# Patient Record
Sex: Female | Born: 1945
Health system: Southern US, Community
[De-identification: ages and names within clinical notes are randomized; demographics above are authoritative.]

## PROBLEM LIST (undated history)

## (undated) DIAGNOSIS — R1310 Dysphagia, unspecified: Secondary | ICD-10-CM

## (undated) DIAGNOSIS — G231 Progressive supranuclear ophthalmoplegia [Steele-Richardson-Olszewski]: Secondary | ICD-10-CM

## (undated) DIAGNOSIS — K635 Polyp of colon: Secondary | ICD-10-CM

## (undated) DIAGNOSIS — K219 Gastro-esophageal reflux disease without esophagitis: Secondary | ICD-10-CM

## (undated) DIAGNOSIS — R0602 Shortness of breath: Secondary | ICD-10-CM

## (undated) DIAGNOSIS — C50919 Malignant neoplasm of unspecified site of unspecified female breast: Secondary | ICD-10-CM

## (undated) DIAGNOSIS — E785 Hyperlipidemia, unspecified: Secondary | ICD-10-CM

## (undated) DIAGNOSIS — I10 Essential (primary) hypertension: Secondary | ICD-10-CM

## (undated) DIAGNOSIS — R51 Headache: Secondary | ICD-10-CM

## (undated) DIAGNOSIS — C439 Malignant melanoma of skin, unspecified: Secondary | ICD-10-CM

## (undated) DIAGNOSIS — F329 Major depressive disorder, single episode, unspecified: Secondary | ICD-10-CM

## (undated) DIAGNOSIS — M549 Dorsalgia, unspecified: Secondary | ICD-10-CM

## (undated) DIAGNOSIS — Z9289 Personal history of other medical treatment: Secondary | ICD-10-CM

## (undated) DIAGNOSIS — R269 Unspecified abnormalities of gait and mobility: Secondary | ICD-10-CM

## (undated) DIAGNOSIS — R471 Dysarthria and anarthria: Secondary | ICD-10-CM

## (undated) DIAGNOSIS — F32A Depression, unspecified: Secondary | ICD-10-CM

## (undated) DIAGNOSIS — E039 Hypothyroidism, unspecified: Secondary | ICD-10-CM

## (undated) DIAGNOSIS — Z780 Asymptomatic menopausal state: Secondary | ICD-10-CM

## (undated) DIAGNOSIS — E669 Obesity, unspecified: Secondary | ICD-10-CM

## (undated) DIAGNOSIS — G8929 Other chronic pain: Secondary | ICD-10-CM

## (undated) DIAGNOSIS — R490 Dysphonia: Secondary | ICD-10-CM

## (undated) HISTORY — DX: Dysarthria and anarthria: R47.1

## (undated) HISTORY — DX: Essential (primary) hypertension: I10

## (undated) HISTORY — PX: CATARACT EXTRACTION: SUR2

## (undated) HISTORY — DX: Shortness of breath: R06.02

## (undated) HISTORY — DX: Malignant neoplasm of unspecified site of unspecified female breast: C50.919

## (undated) HISTORY — DX: Major depressive disorder, single episode, unspecified: F32.9

## (undated) HISTORY — PX: OTHER SURGICAL HISTORY: SHX169

## (undated) HISTORY — DX: Gastro-esophageal reflux disease without esophagitis: K21.9

## (undated) HISTORY — DX: Dysphonia: R49.0

## (undated) HISTORY — DX: Other chronic pain: G89.29

## (undated) HISTORY — DX: Dorsalgia, unspecified: M54.9

## (undated) HISTORY — DX: Asymptomatic menopausal state: Z78.0

## (undated) HISTORY — DX: Polyp of colon: K63.5

## (undated) HISTORY — DX: Hyperlipidemia, unspecified: E78.5

## (undated) HISTORY — DX: Unspecified abnormalities of gait and mobility: R26.9

## (undated) HISTORY — PX: MASTECTOMY: SHX3

## (undated) HISTORY — DX: Malignant melanoma of skin, unspecified: C43.9

## (undated) HISTORY — DX: Personal history of other medical treatment: Z92.89

## (undated) HISTORY — DX: Progressive supranuclear ophthalmoplegia (steele-Richardson-olszewski): G23.1

## (undated) HISTORY — DX: Dysphagia, unspecified: R13.10

## (undated) HISTORY — DX: Hypothyroidism, unspecified: E03.9

## (undated) HISTORY — DX: Obesity, unspecified: E66.9

## (undated) HISTORY — DX: Headache: R51

## (undated) HISTORY — DX: Depression, unspecified: F32.A

---

## 1988-05-17 HISTORY — PX: TOTAL ABDOMINAL HYSTERECTOMY W/ BILATERAL SALPINGOOPHORECTOMY: SHX83

## 1994-05-17 DIAGNOSIS — C50919 Malignant neoplasm of unspecified site of unspecified female breast: Secondary | ICD-10-CM

## 1994-05-17 HISTORY — DX: Malignant neoplasm of unspecified site of unspecified female breast: C50.919

## 1998-03-10 ENCOUNTER — Ambulatory Visit (HOSPITAL_BASED_OUTPATIENT_CLINIC_OR_DEPARTMENT_OTHER): Admission: RE | Admit: 1998-03-10 | Discharge: 1998-03-10 | Payer: Self-pay | Admitting: *Deleted

## 1998-06-17 ENCOUNTER — Ambulatory Visit (HOSPITAL_COMMUNITY): Admission: RE | Admit: 1998-06-17 | Discharge: 1998-06-17 | Payer: Self-pay | Admitting: *Deleted

## 1998-09-25 ENCOUNTER — Other Ambulatory Visit: Admission: RE | Admit: 1998-09-25 | Discharge: 1998-09-25 | Payer: Self-pay | Admitting: Obstetrics and Gynecology

## 1998-10-15 ENCOUNTER — Ambulatory Visit (HOSPITAL_COMMUNITY): Admission: RE | Admit: 1998-10-15 | Discharge: 1998-10-15 | Payer: Self-pay | Admitting: *Deleted

## 1999-10-13 ENCOUNTER — Other Ambulatory Visit: Admission: RE | Admit: 1999-10-13 | Discharge: 1999-10-13 | Payer: Self-pay | Admitting: Obstetrics and Gynecology

## 1999-12-02 ENCOUNTER — Encounter: Admission: RE | Admit: 1999-12-02 | Discharge: 1999-12-02 | Payer: Self-pay | Admitting: Obstetrics and Gynecology

## 1999-12-02 ENCOUNTER — Encounter: Payer: Self-pay | Admitting: Obstetrics and Gynecology

## 2000-10-13 ENCOUNTER — Other Ambulatory Visit: Admission: RE | Admit: 2000-10-13 | Discharge: 2000-10-13 | Payer: Self-pay | Admitting: Obstetrics and Gynecology

## 2000-11-04 ENCOUNTER — Other Ambulatory Visit: Admission: RE | Admit: 2000-11-04 | Discharge: 2000-11-04 | Payer: Self-pay | Admitting: Obstetrics and Gynecology

## 2000-11-04 ENCOUNTER — Encounter (INDEPENDENT_AMBULATORY_CARE_PROVIDER_SITE_OTHER): Payer: Self-pay

## 2000-11-11 ENCOUNTER — Ambulatory Visit (HOSPITAL_COMMUNITY): Admission: RE | Admit: 2000-11-11 | Discharge: 2000-11-11 | Payer: Self-pay | Admitting: Obstetrics and Gynecology

## 2000-11-11 ENCOUNTER — Encounter (INDEPENDENT_AMBULATORY_CARE_PROVIDER_SITE_OTHER): Payer: Self-pay | Admitting: Specialist

## 2000-12-09 ENCOUNTER — Encounter (INDEPENDENT_AMBULATORY_CARE_PROVIDER_SITE_OTHER): Payer: Self-pay

## 2000-12-09 ENCOUNTER — Ambulatory Visit (HOSPITAL_COMMUNITY): Admission: RE | Admit: 2000-12-09 | Discharge: 2000-12-09 | Payer: Self-pay | Admitting: Obstetrics and Gynecology

## 2000-12-27 ENCOUNTER — Encounter: Admission: RE | Admit: 2000-12-27 | Discharge: 2000-12-27 | Payer: Self-pay | Admitting: Obstetrics and Gynecology

## 2000-12-27 ENCOUNTER — Encounter: Payer: Self-pay | Admitting: Obstetrics and Gynecology

## 2001-05-02 ENCOUNTER — Encounter: Payer: Self-pay | Admitting: Emergency Medicine

## 2001-05-03 ENCOUNTER — Inpatient Hospital Stay (HOSPITAL_COMMUNITY): Admission: EM | Admit: 2001-05-03 | Discharge: 2001-05-05 | Payer: Self-pay | Admitting: Emergency Medicine

## 2001-05-03 ENCOUNTER — Encounter: Payer: Self-pay | Admitting: Cardiology

## 2001-05-05 ENCOUNTER — Encounter: Payer: Self-pay | Admitting: Cardiology

## 2001-10-11 ENCOUNTER — Other Ambulatory Visit: Admission: RE | Admit: 2001-10-11 | Discharge: 2001-10-11 | Payer: Self-pay | Admitting: Obstetrics and Gynecology

## 2002-01-01 ENCOUNTER — Encounter: Payer: Self-pay | Admitting: Obstetrics and Gynecology

## 2002-01-01 ENCOUNTER — Encounter: Admission: RE | Admit: 2002-01-01 | Discharge: 2002-01-01 | Payer: Self-pay | Admitting: Obstetrics and Gynecology

## 2002-01-22 ENCOUNTER — Encounter: Admission: RE | Admit: 2002-01-22 | Discharge: 2002-01-22 | Payer: Self-pay | Admitting: Internal Medicine

## 2002-01-22 ENCOUNTER — Encounter: Payer: Self-pay | Admitting: Internal Medicine

## 2002-09-03 ENCOUNTER — Encounter: Admission: RE | Admit: 2002-09-03 | Discharge: 2002-12-02 | Payer: Self-pay | Admitting: Internal Medicine

## 2002-11-27 ENCOUNTER — Encounter: Payer: Self-pay | Admitting: Internal Medicine

## 2002-11-27 ENCOUNTER — Encounter: Admission: RE | Admit: 2002-11-27 | Discharge: 2002-11-27 | Payer: Self-pay | Admitting: Internal Medicine

## 2003-01-15 ENCOUNTER — Encounter: Admission: RE | Admit: 2003-01-15 | Discharge: 2003-01-15 | Payer: Self-pay | Admitting: Obstetrics and Gynecology

## 2003-01-15 ENCOUNTER — Encounter: Payer: Self-pay | Admitting: Obstetrics and Gynecology

## 2003-08-06 ENCOUNTER — Ambulatory Visit (HOSPITAL_COMMUNITY): Admission: RE | Admit: 2003-08-06 | Discharge: 2003-08-06 | Payer: Self-pay | Admitting: *Deleted

## 2003-11-05 ENCOUNTER — Ambulatory Visit (HOSPITAL_COMMUNITY): Admission: RE | Admit: 2003-11-05 | Discharge: 2003-11-05 | Payer: Self-pay | Admitting: Neurosurgery

## 2004-01-17 ENCOUNTER — Encounter: Admission: RE | Admit: 2004-01-17 | Discharge: 2004-01-17 | Payer: Self-pay | Admitting: Obstetrics and Gynecology

## 2004-01-22 ENCOUNTER — Inpatient Hospital Stay (HOSPITAL_COMMUNITY): Admission: RE | Admit: 2004-01-22 | Discharge: 2004-01-23 | Payer: Self-pay | Admitting: Neurosurgery

## 2004-03-16 ENCOUNTER — Ambulatory Visit: Payer: Self-pay | Admitting: Internal Medicine

## 2004-03-26 ENCOUNTER — Ambulatory Visit: Payer: Self-pay | Admitting: Internal Medicine

## 2004-04-21 ENCOUNTER — Ambulatory Visit: Payer: Self-pay | Admitting: Internal Medicine

## 2004-04-22 ENCOUNTER — Ambulatory Visit: Payer: Self-pay | Admitting: Internal Medicine

## 2004-05-12 ENCOUNTER — Ambulatory Visit: Payer: Self-pay | Admitting: Internal Medicine

## 2004-05-17 DIAGNOSIS — C439 Malignant melanoma of skin, unspecified: Secondary | ICD-10-CM

## 2004-05-17 HISTORY — DX: Malignant melanoma of skin, unspecified: C43.9

## 2004-05-19 ENCOUNTER — Ambulatory Visit: Payer: Self-pay | Admitting: Internal Medicine

## 2004-06-29 ENCOUNTER — Ambulatory Visit: Payer: Self-pay | Admitting: Internal Medicine

## 2004-07-21 ENCOUNTER — Ambulatory Visit: Payer: Self-pay | Admitting: Internal Medicine

## 2004-08-18 ENCOUNTER — Ambulatory Visit: Payer: Self-pay | Admitting: Internal Medicine

## 2004-09-01 ENCOUNTER — Ambulatory Visit: Payer: Self-pay | Admitting: Internal Medicine

## 2004-09-04 ENCOUNTER — Ambulatory Visit: Payer: Self-pay | Admitting: Pulmonary Disease

## 2004-09-04 ENCOUNTER — Ambulatory Visit (HOSPITAL_COMMUNITY): Admission: RE | Admit: 2004-09-04 | Discharge: 2004-09-04 | Payer: Self-pay | Admitting: Internal Medicine

## 2004-09-15 ENCOUNTER — Ambulatory Visit: Payer: Self-pay | Admitting: Internal Medicine

## 2004-12-22 ENCOUNTER — Ambulatory Visit: Payer: Self-pay | Admitting: Internal Medicine

## 2005-02-02 ENCOUNTER — Ambulatory Visit: Payer: Self-pay | Admitting: Internal Medicine

## 2005-02-09 ENCOUNTER — Ambulatory Visit: Payer: Self-pay | Admitting: Internal Medicine

## 2005-02-16 ENCOUNTER — Ambulatory Visit: Payer: Self-pay | Admitting: Internal Medicine

## 2005-03-02 ENCOUNTER — Ambulatory Visit: Payer: Self-pay | Admitting: Internal Medicine

## 2005-03-09 ENCOUNTER — Encounter: Admission: RE | Admit: 2005-03-09 | Discharge: 2005-03-09 | Payer: Self-pay | Admitting: Obstetrics and Gynecology

## 2005-03-24 ENCOUNTER — Encounter: Admission: RE | Admit: 2005-03-24 | Discharge: 2005-03-24 | Payer: Self-pay | Admitting: Obstetrics and Gynecology

## 2005-03-26 ENCOUNTER — Ambulatory Visit: Payer: Self-pay | Admitting: Internal Medicine

## 2005-04-13 ENCOUNTER — Encounter: Admission: RE | Admit: 2005-04-13 | Discharge: 2005-04-13 | Payer: Self-pay | Admitting: Internal Medicine

## 2005-04-13 ENCOUNTER — Ambulatory Visit: Payer: Self-pay | Admitting: Internal Medicine

## 2005-05-03 ENCOUNTER — Ambulatory Visit (HOSPITAL_COMMUNITY): Admission: RE | Admit: 2005-05-03 | Discharge: 2005-05-04 | Payer: Self-pay | Admitting: Neurosurgery

## 2005-05-23 ENCOUNTER — Encounter: Admission: RE | Admit: 2005-05-23 | Discharge: 2005-05-23 | Payer: Self-pay | Admitting: Neurosurgery

## 2005-05-25 ENCOUNTER — Ambulatory Visit (HOSPITAL_COMMUNITY): Admission: RE | Admit: 2005-05-25 | Discharge: 2005-05-25 | Payer: Self-pay | Admitting: Neurosurgery

## 2005-06-01 ENCOUNTER — Encounter: Admission: RE | Admit: 2005-06-01 | Discharge: 2005-06-01 | Payer: Self-pay | Admitting: Neurosurgery

## 2005-06-21 ENCOUNTER — Encounter: Admission: RE | Admit: 2005-06-21 | Discharge: 2005-06-21 | Payer: Self-pay | Admitting: Neurosurgery

## 2005-07-08 ENCOUNTER — Encounter: Admission: RE | Admit: 2005-07-08 | Discharge: 2005-07-08 | Payer: Self-pay | Admitting: Neurosurgery

## 2005-09-02 ENCOUNTER — Ambulatory Visit (HOSPITAL_COMMUNITY): Admission: RE | Admit: 2005-09-02 | Discharge: 2005-09-02 | Payer: Self-pay | Admitting: Neurosurgery

## 2005-09-22 ENCOUNTER — Ambulatory Visit: Payer: Self-pay | Admitting: Internal Medicine

## 2005-12-10 ENCOUNTER — Ambulatory Visit: Payer: Self-pay | Admitting: Internal Medicine

## 2005-12-14 ENCOUNTER — Ambulatory Visit: Payer: Self-pay | Admitting: Internal Medicine

## 2006-03-30 ENCOUNTER — Encounter: Admission: RE | Admit: 2006-03-30 | Discharge: 2006-03-30 | Payer: Self-pay | Admitting: Obstetrics and Gynecology

## 2006-09-02 ENCOUNTER — Ambulatory Visit: Payer: Self-pay | Admitting: Internal Medicine

## 2006-09-02 LAB — CONVERTED CEMR LAB
BUN: 15 mg/dL (ref 6–23)
CO2: 25 meq/L (ref 19–32)
Calcium: 8.9 mg/dL (ref 8.4–10.5)
Creatinine, Ser: 0.84 mg/dL (ref 0.40–1.20)
Eosinophils Absolute: 0.1 10*3/uL (ref 0.0–0.7)
Eosinophils Relative: 1 % (ref 0–5)
Glucose, Bld: 111 mg/dL — ABNORMAL HIGH (ref 70–99)
HCT: 38.1 % (ref 36.0–46.0)
Hemoglobin: 12.1 g/dL (ref 12.0–15.0)
Lymphocytes Relative: 30 % (ref 12–46)
Lymphs Abs: 3 10*3/uL (ref 0.7–3.3)
MCV: 81.9 fL (ref 78.0–100.0)
Monocytes Absolute: 0.7 10*3/uL (ref 0.2–0.7)
Monocytes Relative: 7 % (ref 3–11)
Platelets: 354 10*3/uL (ref 150–400)
TSH: 0.702 microintl units/mL (ref 0.350–5.50)
WBC: 10.1 10*3/uL (ref 4.0–10.5)

## 2006-09-04 ENCOUNTER — Emergency Department (HOSPITAL_COMMUNITY): Admission: EM | Admit: 2006-09-04 | Discharge: 2006-09-04 | Payer: Self-pay | Admitting: Emergency Medicine

## 2006-09-21 ENCOUNTER — Ambulatory Visit: Payer: Self-pay | Admitting: Internal Medicine

## 2006-10-03 DIAGNOSIS — Z860100 Personal history of colon polyps, unspecified: Secondary | ICD-10-CM | POA: Insufficient documentation

## 2006-10-03 DIAGNOSIS — N951 Menopausal and female climacteric states: Secondary | ICD-10-CM

## 2006-10-03 DIAGNOSIS — F329 Major depressive disorder, single episode, unspecified: Secondary | ICD-10-CM

## 2006-10-03 DIAGNOSIS — E0590 Thyrotoxicosis, unspecified without thyrotoxic crisis or storm: Secondary | ICD-10-CM | POA: Insufficient documentation

## 2006-10-03 DIAGNOSIS — K219 Gastro-esophageal reflux disease without esophagitis: Secondary | ICD-10-CM | POA: Insufficient documentation

## 2006-10-03 DIAGNOSIS — M810 Age-related osteoporosis without current pathological fracture: Secondary | ICD-10-CM | POA: Insufficient documentation

## 2006-10-03 DIAGNOSIS — E785 Hyperlipidemia, unspecified: Secondary | ICD-10-CM | POA: Insufficient documentation

## 2006-10-12 ENCOUNTER — Ambulatory Visit: Payer: Self-pay | Admitting: Internal Medicine

## 2006-10-12 ENCOUNTER — Encounter: Payer: Self-pay | Admitting: Internal Medicine

## 2006-10-13 ENCOUNTER — Encounter: Payer: Self-pay | Admitting: Internal Medicine

## 2006-12-27 ENCOUNTER — Ambulatory Visit: Payer: Self-pay | Admitting: Family Medicine

## 2006-12-27 DIAGNOSIS — E039 Hypothyroidism, unspecified: Secondary | ICD-10-CM | POA: Insufficient documentation

## 2006-12-27 DIAGNOSIS — R498 Other voice and resonance disorders: Secondary | ICD-10-CM

## 2007-01-02 LAB — CONVERTED CEMR LAB
Basophils Absolute: 0.1 10*3/uL (ref 0.0–0.1)
Basophils Relative: 0.8 % (ref 0.0–1.0)
Calcium: 9.6 mg/dL (ref 8.4–10.5)
Chloride: 104 meq/L (ref 96–112)
Folate: >20 ng/mL
GFR calc Af Amer: 94 mL/min
GFR calc non Af Amer: 78 mL/min
Glucose, Bld: 103 mg/dL — ABNORMAL HIGH (ref 70–99)
Hemoglobin: 13 g/dL (ref 12.0–15.0)
MCHC: 34.3 g/dL (ref 30.0–36.0)
Monocytes Absolute: 0.6 10*3/uL (ref 0.2–0.7)
Monocytes Relative: 6.9 % (ref 3.0–11.0)
Platelets: 306 10*3/uL (ref 150–400)
RDW: 13.7 % (ref 11.5–14.6)
Sodium: 141 meq/L (ref 135–145)
TSH: 1.14 microintl units/mL (ref 0.35–5.50)
Vitamin B-12: 792 pg/mL (ref 211–911)

## 2007-02-03 ENCOUNTER — Ambulatory Visit: Payer: Self-pay | Admitting: Family Medicine

## 2007-02-21 ENCOUNTER — Ambulatory Visit: Payer: Self-pay | Admitting: Internal Medicine

## 2007-02-21 DIAGNOSIS — I10 Essential (primary) hypertension: Secondary | ICD-10-CM | POA: Insufficient documentation

## 2007-02-21 DIAGNOSIS — M549 Dorsalgia, unspecified: Secondary | ICD-10-CM

## 2007-03-09 ENCOUNTER — Ambulatory Visit: Payer: Self-pay | Admitting: Internal Medicine

## 2007-03-14 ENCOUNTER — Encounter (INDEPENDENT_AMBULATORY_CARE_PROVIDER_SITE_OTHER): Payer: Self-pay | Admitting: *Deleted

## 2007-03-14 LAB — CONVERTED CEMR LAB
BUN: 12 mg/dL (ref 6–23)
CO2: 31 meq/L (ref 19–32)
Chloride: 102 meq/L (ref 96–112)
Cholesterol: 233 mg/dL (ref 0–200)
Creatinine, Ser: 0.8 mg/dL (ref 0.4–1.2)
Direct LDL: 135.9 mg/dL
VLDL: 78 mg/dL — ABNORMAL HIGH (ref 0–40)

## 2007-04-03 ENCOUNTER — Encounter: Admission: RE | Admit: 2007-04-03 | Discharge: 2007-04-03 | Payer: Self-pay | Admitting: Obstetrics and Gynecology

## 2007-04-05 ENCOUNTER — Ambulatory Visit: Payer: Self-pay | Admitting: Internal Medicine

## 2007-05-18 DIAGNOSIS — Z9289 Personal history of other medical treatment: Secondary | ICD-10-CM

## 2007-05-18 HISTORY — DX: Personal history of other medical treatment: Z92.89

## 2007-06-29 ENCOUNTER — Ambulatory Visit: Payer: Self-pay | Admitting: Internal Medicine

## 2007-07-31 ENCOUNTER — Encounter (INDEPENDENT_AMBULATORY_CARE_PROVIDER_SITE_OTHER): Payer: Self-pay | Admitting: *Deleted

## 2007-08-07 ENCOUNTER — Telehealth (INDEPENDENT_AMBULATORY_CARE_PROVIDER_SITE_OTHER): Payer: Self-pay | Admitting: *Deleted

## 2007-10-13 ENCOUNTER — Ambulatory Visit: Payer: Self-pay | Admitting: Internal Medicine

## 2007-10-13 DIAGNOSIS — Z853 Personal history of malignant neoplasm of breast: Secondary | ICD-10-CM | POA: Insufficient documentation

## 2007-10-16 ENCOUNTER — Telehealth (INDEPENDENT_AMBULATORY_CARE_PROVIDER_SITE_OTHER): Payer: Self-pay | Admitting: *Deleted

## 2007-10-16 ENCOUNTER — Ambulatory Visit: Payer: Self-pay

## 2007-10-16 ENCOUNTER — Encounter: Payer: Self-pay | Admitting: Internal Medicine

## 2007-10-23 ENCOUNTER — Telehealth (INDEPENDENT_AMBULATORY_CARE_PROVIDER_SITE_OTHER): Payer: Self-pay | Admitting: *Deleted

## 2007-10-23 ENCOUNTER — Ambulatory Visit: Payer: Self-pay | Admitting: Internal Medicine

## 2007-10-24 HISTORY — PX: SPINAL FUSION: SHX223

## 2007-10-26 ENCOUNTER — Telehealth: Payer: Self-pay | Admitting: Internal Medicine

## 2008-01-01 ENCOUNTER — Encounter: Admission: RE | Admit: 2008-01-01 | Discharge: 2008-03-31 | Payer: Self-pay | Admitting: Orthopaedic Surgery

## 2008-01-05 ENCOUNTER — Ambulatory Visit: Payer: Self-pay | Admitting: Internal Medicine

## 2008-01-05 ENCOUNTER — Telehealth: Payer: Self-pay | Admitting: Internal Medicine

## 2008-01-05 ENCOUNTER — Ambulatory Visit: Payer: Self-pay

## 2008-01-08 ENCOUNTER — Telehealth (INDEPENDENT_AMBULATORY_CARE_PROVIDER_SITE_OTHER): Payer: Self-pay | Admitting: *Deleted

## 2008-01-19 ENCOUNTER — Ambulatory Visit: Payer: Self-pay | Admitting: Internal Medicine

## 2008-02-29 ENCOUNTER — Ambulatory Visit: Payer: Self-pay | Admitting: Internal Medicine

## 2008-02-29 DIAGNOSIS — J309 Allergic rhinitis, unspecified: Secondary | ICD-10-CM | POA: Insufficient documentation

## 2008-03-19 ENCOUNTER — Ambulatory Visit: Payer: Self-pay | Admitting: Internal Medicine

## 2008-03-22 ENCOUNTER — Ambulatory Visit: Payer: Self-pay | Admitting: Internal Medicine

## 2008-03-26 ENCOUNTER — Telehealth (INDEPENDENT_AMBULATORY_CARE_PROVIDER_SITE_OTHER): Payer: Self-pay | Admitting: *Deleted

## 2008-03-26 LAB — CONVERTED CEMR LAB
Calcium: 9.5 mg/dL (ref 8.4–10.5)
GFR calc Af Amer: 109 mL/min
GFR calc non Af Amer: 90 mL/min
Glucose, Bld: 81 mg/dL (ref 70–99)
Sodium: 142 meq/L (ref 135–145)

## 2008-04-01 ENCOUNTER — Encounter: Admission: RE | Admit: 2008-04-01 | Discharge: 2008-05-13 | Payer: Self-pay | Admitting: Orthopaedic Surgery

## 2008-04-03 ENCOUNTER — Encounter: Admission: RE | Admit: 2008-04-03 | Discharge: 2008-04-03 | Payer: Self-pay | Admitting: Obstetrics and Gynecology

## 2008-04-16 DIAGNOSIS — G231 Progressive supranuclear ophthalmoplegia [Steele-Richardson-Olszewski]: Secondary | ICD-10-CM

## 2008-04-16 HISTORY — DX: Progressive supranuclear ophthalmoplegia (steele-Richardson-olszewski): G23.1

## 2008-05-02 ENCOUNTER — Encounter: Payer: Self-pay | Admitting: Internal Medicine

## 2008-05-14 ENCOUNTER — Ambulatory Visit: Payer: Self-pay | Admitting: Internal Medicine

## 2008-05-15 ENCOUNTER — Ambulatory Visit: Payer: Self-pay | Admitting: Internal Medicine

## 2008-05-20 ENCOUNTER — Telehealth: Payer: Self-pay | Admitting: Internal Medicine

## 2008-05-20 LAB — CONVERTED CEMR LAB
GFR calc Af Amer: 93 mL/min
GFR calc non Af Amer: 77 mL/min
Glucose, Bld: 91 mg/dL (ref 70–99)
MCHC: 33.9 g/dL (ref 30.0–36.0)
Potassium: 3.7 meq/L (ref 3.5–5.1)
RBC: 4.3 M/uL (ref 3.87–5.11)
RDW: 13.6 % (ref 11.5–14.6)
Sodium: 141 meq/L (ref 135–145)
TSH: 1.07 microintl units/mL (ref 0.35–5.50)

## 2008-05-28 ENCOUNTER — Ambulatory Visit: Payer: Self-pay

## 2008-05-28 ENCOUNTER — Encounter: Payer: Self-pay | Admitting: Internal Medicine

## 2008-06-05 ENCOUNTER — Telehealth: Payer: Self-pay | Admitting: Internal Medicine

## 2008-06-05 ENCOUNTER — Encounter: Admission: RE | Admit: 2008-06-05 | Discharge: 2008-08-09 | Payer: Self-pay | Admitting: Neurology

## 2008-06-11 ENCOUNTER — Encounter: Payer: Self-pay | Admitting: Internal Medicine

## 2008-06-14 ENCOUNTER — Telehealth: Payer: Self-pay | Admitting: Family Medicine

## 2008-07-05 ENCOUNTER — Ambulatory Visit: Payer: Self-pay | Admitting: Internal Medicine

## 2008-07-05 DIAGNOSIS — G231 Progressive supranuclear ophthalmoplegia [Steele-Richardson-Olszewski]: Secondary | ICD-10-CM | POA: Insufficient documentation

## 2008-07-31 ENCOUNTER — Encounter: Payer: Self-pay | Admitting: Internal Medicine

## 2008-09-10 ENCOUNTER — Ambulatory Visit: Payer: Self-pay | Admitting: Internal Medicine

## 2008-10-18 ENCOUNTER — Encounter: Payer: Self-pay | Admitting: Internal Medicine

## 2008-10-31 ENCOUNTER — Encounter: Payer: Self-pay | Admitting: Internal Medicine

## 2008-11-08 ENCOUNTER — Ambulatory Visit: Payer: Self-pay | Admitting: Internal Medicine

## 2008-11-14 ENCOUNTER — Ambulatory Visit: Payer: Self-pay | Admitting: Internal Medicine

## 2008-11-14 LAB — CONVERTED CEMR LAB
Calcium: 9.5 mg/dL (ref 8.4–10.5)
GFR calc non Af Amer: 89.57 mL/min (ref >60)
Sodium: 140 meq/L (ref 135–145)

## 2008-11-29 ENCOUNTER — Ambulatory Visit: Payer: Self-pay | Admitting: Internal Medicine

## 2008-12-03 ENCOUNTER — Telehealth (INDEPENDENT_AMBULATORY_CARE_PROVIDER_SITE_OTHER): Payer: Self-pay | Admitting: *Deleted

## 2008-12-05 ENCOUNTER — Telehealth (INDEPENDENT_AMBULATORY_CARE_PROVIDER_SITE_OTHER): Payer: Self-pay | Admitting: *Deleted

## 2008-12-05 ENCOUNTER — Telehealth: Payer: Self-pay | Admitting: Family Medicine

## 2008-12-05 LAB — CONVERTED CEMR LAB
Potassium: 2.9 meq/L — ABNORMAL LOW (ref 3.5–5.3)
Sodium: 138 meq/L (ref 135–145)

## 2008-12-17 ENCOUNTER — Telehealth (INDEPENDENT_AMBULATORY_CARE_PROVIDER_SITE_OTHER): Payer: Self-pay | Admitting: *Deleted

## 2008-12-18 ENCOUNTER — Ambulatory Visit: Payer: Self-pay | Admitting: Internal Medicine

## 2008-12-26 LAB — CONVERTED CEMR LAB: Potassium: 4.3 meq/L (ref 3.5–5.1)

## 2009-01-17 ENCOUNTER — Encounter: Payer: Self-pay | Admitting: Internal Medicine

## 2009-02-06 ENCOUNTER — Ambulatory Visit (HOSPITAL_COMMUNITY): Admission: RE | Admit: 2009-02-06 | Discharge: 2009-02-06 | Payer: Self-pay | Admitting: Anesthesiology

## 2009-02-24 ENCOUNTER — Encounter: Admission: RE | Admit: 2009-02-24 | Discharge: 2009-05-14 | Payer: Self-pay | Admitting: Psychiatry

## 2009-03-21 ENCOUNTER — Ambulatory Visit: Payer: Self-pay | Admitting: Internal Medicine

## 2009-03-24 ENCOUNTER — Encounter: Payer: Self-pay | Admitting: Internal Medicine

## 2009-04-17 ENCOUNTER — Encounter: Admission: RE | Admit: 2009-04-17 | Discharge: 2009-04-17 | Payer: Self-pay | Admitting: Obstetrics and Gynecology

## 2009-05-28 ENCOUNTER — Ambulatory Visit: Payer: Self-pay | Admitting: Internal Medicine

## 2009-05-28 DIAGNOSIS — M25559 Pain in unspecified hip: Secondary | ICD-10-CM

## 2009-05-30 LAB — CONVERTED CEMR LAB
Basophils Relative: 6.5 % — ABNORMAL HIGH (ref 0.0–3.0)
CO2: 22 meq/L (ref 19–32)
Chloride: 100 meq/L (ref 96–112)
Eosinophils Absolute: 0.2 10*3/uL (ref 0.0–0.7)
Hemoglobin: 12.4 g/dL (ref 12.0–15.0)
Lymphocytes Relative: 27.4 % (ref 12.0–46.0)
MCHC: 32.3 g/dL (ref 30.0–36.0)
MCV: 85.4 fL (ref 78.0–100.0)
Neutro Abs: 3.5 10*3/uL (ref 1.4–7.7)
RBC: 4.5 M/uL (ref 3.87–5.11)
Sodium: 136 meq/L (ref 135–145)

## 2009-06-20 ENCOUNTER — Encounter: Payer: Self-pay | Admitting: Internal Medicine

## 2009-06-25 ENCOUNTER — Ambulatory Visit: Payer: Self-pay | Admitting: Internal Medicine

## 2009-06-30 ENCOUNTER — Ambulatory Visit: Payer: Self-pay | Admitting: Internal Medicine

## 2009-07-05 ENCOUNTER — Encounter: Admission: RE | Admit: 2009-07-05 | Discharge: 2009-07-05 | Payer: Self-pay | Admitting: Neurology

## 2009-07-11 ENCOUNTER — Telehealth: Payer: Self-pay | Admitting: Internal Medicine

## 2009-07-28 ENCOUNTER — Encounter: Admission: RE | Admit: 2009-07-28 | Discharge: 2009-07-28 | Payer: Self-pay | Admitting: Sports Medicine

## 2009-08-29 ENCOUNTER — Ambulatory Visit: Payer: Self-pay | Admitting: Internal Medicine

## 2009-09-09 ENCOUNTER — Telehealth: Payer: Self-pay | Admitting: Internal Medicine

## 2009-09-12 ENCOUNTER — Ambulatory Visit: Payer: Self-pay | Admitting: Internal Medicine

## 2009-09-24 ENCOUNTER — Encounter: Payer: Self-pay | Admitting: Internal Medicine

## 2009-09-25 ENCOUNTER — Encounter: Payer: Self-pay | Admitting: Internal Medicine

## 2009-09-29 ENCOUNTER — Encounter: Payer: Self-pay | Admitting: Internal Medicine

## 2009-10-08 ENCOUNTER — Encounter (INDEPENDENT_AMBULATORY_CARE_PROVIDER_SITE_OTHER): Payer: Self-pay | Admitting: *Deleted

## 2009-10-10 ENCOUNTER — Encounter: Payer: Self-pay | Admitting: Internal Medicine

## 2009-10-14 ENCOUNTER — Encounter: Payer: Self-pay | Admitting: Internal Medicine

## 2009-10-16 ENCOUNTER — Encounter: Payer: Self-pay | Admitting: Internal Medicine

## 2009-12-17 ENCOUNTER — Encounter: Payer: Self-pay | Admitting: Internal Medicine

## 2009-12-18 ENCOUNTER — Ambulatory Visit: Payer: Self-pay | Admitting: Internal Medicine

## 2009-12-18 LAB — CONVERTED CEMR LAB
CO2: 30 meq/L (ref 19–32)
Calcium: 9.6 mg/dL (ref 8.4–10.5)
Chloride: 100 meq/L (ref 96–112)
Glucose, Bld: 95 mg/dL (ref 70–99)
Sodium: 141 meq/L (ref 135–145)

## 2009-12-19 ENCOUNTER — Telehealth: Payer: Self-pay | Admitting: Internal Medicine

## 2010-01-02 ENCOUNTER — Ambulatory Visit: Payer: Self-pay | Admitting: Internal Medicine

## 2010-01-26 ENCOUNTER — Telehealth (INDEPENDENT_AMBULATORY_CARE_PROVIDER_SITE_OTHER): Payer: Self-pay | Admitting: *Deleted

## 2010-01-28 ENCOUNTER — Ambulatory Visit: Payer: Self-pay | Admitting: Internal Medicine

## 2010-01-28 LAB — CONVERTED CEMR LAB
CO2: 27 meq/L (ref 19–32)
Calcium: 9.9 mg/dL (ref 8.4–10.5)
Chloride: 104 meq/L (ref 96–112)
Glucose, Bld: 100 mg/dL — ABNORMAL HIGH (ref 70–99)
Potassium: 2.8 meq/L — CL (ref 3.5–5.1)
Sodium: 140 meq/L (ref 135–145)

## 2010-02-12 ENCOUNTER — Ambulatory Visit: Payer: Self-pay | Admitting: Internal Medicine

## 2010-02-19 ENCOUNTER — Ambulatory Visit: Payer: Self-pay | Admitting: Internal Medicine

## 2010-02-24 LAB — CONVERTED CEMR LAB
BUN: 14 mg/dL (ref 6–23)
CO2: 30 meq/L (ref 19–32)
Chloride: 101 meq/L (ref 96–112)
Creatinine, Ser: 0.8 mg/dL (ref 0.4–1.2)
Glucose, Bld: 90 mg/dL (ref 70–99)

## 2010-04-21 ENCOUNTER — Ambulatory Visit: Payer: Self-pay | Admitting: Internal Medicine

## 2010-04-22 ENCOUNTER — Encounter: Payer: Self-pay | Admitting: Internal Medicine

## 2010-04-30 ENCOUNTER — Encounter: Payer: Self-pay | Admitting: Internal Medicine

## 2010-04-30 ENCOUNTER — Ambulatory Visit (HOSPITAL_COMMUNITY)
Admission: RE | Admit: 2010-04-30 | Discharge: 2010-04-30 | Payer: Self-pay | Source: Home / Self Care | Attending: Internal Medicine | Admitting: Internal Medicine

## 2010-06-07 ENCOUNTER — Encounter: Payer: Self-pay | Admitting: Unknown Physician Specialty

## 2010-06-14 LAB — CONVERTED CEMR LAB
Albumin: 4 g/dL (ref 3.5–5.2)
Alkaline Phosphatase: 67 units/L (ref 39–117)
BUN: 19 mg/dL (ref 6–23)
Basophils Relative: 0.2 % (ref 0.0–1.0)
Calcium: 9.8 mg/dL (ref 8.4–10.5)
Creatinine, Ser: 0.9 mg/dL (ref 0.4–1.2)
Eosinophils Relative: 0.9 % (ref 0.0–5.0)
GFR calc Af Amer: 82 mL/min
Glucose, Bld: 95 mg/dL (ref 70–99)
HCT: 39 % (ref 36.0–46.0)
Hemoglobin: 13.2 g/dL (ref 12.0–15.0)
MCV: 85.3 fL (ref 78.0–100.0)
Monocytes Absolute: 0.7 10*3/uL (ref 0.1–1.0)
Monocytes Relative: 8.2 % (ref 3.0–12.0)
Neutro Abs: 6.1 10*3/uL (ref 1.4–7.7)
TSH: 0.9 microintl units/mL (ref 0.35–5.50)
Total Protein: 7.1 g/dL (ref 6.0–8.3)
WBC: 8.9 10*3/uL (ref 4.5–10.5)

## 2010-06-18 NOTE — Assessment & Plan Note (Signed)
Summary: 2 mth fu/ns/kdc   Vital Signs:  Patient profile:   65 year old female Height:      65 inches Weight:      220 pounds BMI:     36.74 Pulse rate:   84 / minute BP sitting:   142 / 84  Vitals Entered By: Shary Decamp (May 28, 2009 3:44 PM) CC: rov   History of Present Illness: PSP the patient saw neurology, note  reviewed, no specific medication changes suggested her muscle strength has decreased particularly in the lower extremities, having a hard time ambulating   also complained of pain by the  left hip saw Dr. Vear Clock, her pain medicine doctor, reportedly at x-ray was negative. Her neurologist prescribed Ultram which is not helping denies any   bulging in that area  depression overall better however complain of   sweats ever seen she is started fluoxetin; symptoms worse lately denies fever or cough  also complained of nausea denies abdominal pain, heartburn, blood in the stools, diarrhea. Takes  NSAIDs sporadically  hypertension good medication compliance  no ambulatory BPs   Current Medications (verified): 1)  Levothyroxine Sodium 125 Mcg Tabs (Levothyroxine Sodium) .Marland Kitchen.. 1 By Mouth Once Daily 2)  Furosemide 20 Mg  Tabs (Furosemide) .Marland Kitchen.. 1 By Mouth Once Daily (Takes 2 Tabs Every 3 Days) 3)  Protonix 40 Mg Tbec (Pantoprazole Sodium) .Marland Kitchen.. 1 Tab 30 Minutes  Before Each Breakfast 4)  Calcium Plus Vit-D .... 500mg  2 Once Daily 5)  Hyzaar 100-25 Mg Tabs (Losartan Potassium-Hctz) .Marland Kitchen.. 1po Once Daily 6)  K-Tabs 10 Meq  Tbcr (Potassium Chloride) .... 3 By Mouth Once Daily; 3 @ Noon 7)  Flonase 50 Mcg/act Susp (Fluticasone Propionate) .... 2 Sprays Once Daily 8)  Mvi 9)  Stalevo 200 .... Three Times A Day 10)  Fluoxetine Hcl 20 Mg Caps (Fluoxetine Hcl) .... One By Mouth Twice A Day 11)  Sinemet 25-100 Mg Tabs (Carbidopa-Levodopa) .... Three Times A Day 12)  Zolpidem Tartrate 5 Mg Tabs (Zolpidem Tartrate) .... One or Two By Mouth At Bedtime As Needed For  Insomnia 13)  Tramadol ?mg .Marland Kitchen.. 3-4x/day  Allergies (verified): No Known Drug Allergies  Past History:  Past Medical History: Reviewed history from 03/21/2009 and no changes required. Parkinson, Dx 12-09--neuro Dr Anne Hahn , Dx change to Progressive Supranuclaer Palsy in 2010 Depression GERD osteoporosis Hyperlipidemia Hypothyroidism Hypertension BACK PAIN, CHRONIC   HOARSENESS, CHRONIC   COLONIC POLYPS, HX OF   POSTMENOPAUSAL STATUS  h/o SOB  s/p a negative Cardio-pulmonary stress test 08-27-04, normal PFT s 2006 Breast cancer, hx of 1996 2006  Melanoma in situ nose, s/p Mohs surgery Allergic rhinitis  Past Surgical History: Reviewed history from 02/29/2008 and no changes required. Mastectomy Hysterectomy  and  oophorectomy 1990 STRESS TEST (DECEMBER 2002) FRACTURE, ARM,  Spinal fusion 10-24-07, 6 screws Cataract extraction --R--  Social History: Reviewed history from 10/13/2007 and no changes required. Married no children  Review of Systems      See HPI  Physical Exam  General:  alert and well-developed.   Mouth:  her voice is quite weak, slightly hoarse Lungs:  normal respiratory effort, no intercostal retractions, no accessory muscle use, and normal breath sounds.   Heart:  normal rate, regular rhythm, and no murmur.   Extremities:  no edema palpation of the proximal L thight ( where she complains of pain) show with no hernia, mass or tenderness.  Rotation of the left hip  does increase the pain Neurologic:  in a wheelchair Psych:  Oriented X3, memory intact for recent and remote, normally interactive, good eye contact, not anxious appearing, and not depressed appearing.     Impression & Recommendations:  Problem # 1:  PROGRESSIVE SUPRANUCLEAR PALSY (ICD-333.0) per neurology patient is now in a wheelchair , having a more difficult time ambulating  Problem # 2:  HIP PAIN, LEFT (ICD-719.45) x-rays reportedly negative was prescribed Ultram, reportedly not  helping switch to Vicodin, watch for excessive sedation  Her updated medication list for this problem includes:    Vicodin 5-500 Mg Tabs (Hydrocodone-acetaminophen) ..... One by mouth every 4 hours as needed for pain  Problem # 3:  HYPERTHYROIDISM (ICD-242.90) checking a TSH Labs Reviewed: TSH: 0.46 (11/14/2008)     Problem # 4:  HYPERTENSION (ICD-401.9) due for labs Her updated medication list for this problem includes:    Furosemide 20 Mg Tabs (Furosemide) .Marland Kitchen... 1 by mouth once daily (takes 2 tabs every 3 days)    Hyzaar 100-25 Mg Tabs (Losartan potassium-hctz) .Marland Kitchen... 1po once daily  BP today: 142/84 Prior BP: 130/82 (03/21/2009)  Labs Reviewed: K+: 4.3 (12/18/2008) Creat: : 0.78 (11/29/2008)   Chol: 233 (03/09/2007)   HDL: 33.9 (03/09/2007)   LDL: DEL (03/09/2007)   TG: 391 (03/09/2007)  Orders: Venipuncture (40981) TLB-BMP (Basic Metabolic Panel-BMET) (80048-METABOL) TLB-CBC Platelet - w/Differential (85025-CBCD)  Problem # 5:  DEPRESSION (ICD-311) symptoms better with fluoxetine but  complain of sweats also has nausea with a negative GI review of systems.  Nausea may be from fluoxetine as well. Plan  switch to Elavil gradually.  See previous office visit note may need to skip ambien Her updated medication list for this problem includes:    Fluoxetine Hcl 20 Mg Caps (Fluoxetine hcl) ..... One by mouth once daily    Amitriptyline Hcl 10 Mg Tabs (Amitriptyline hcl) ..... One by mouth nightly for one week, to by mouth nightly next week, 3 by mouth nightly thereafter  Complete Medication List: 1)  Levothyroxine Sodium 125 Mcg Tabs (Levothyroxine sodium) .Marland Kitchen.. 1 by mouth once daily 2)  Furosemide 20 Mg Tabs (Furosemide) .Marland Kitchen.. 1 by mouth once daily (takes 2 tabs every 3 days) 3)  Protonix 40 Mg Tbec (Pantoprazole sodium) .Marland Kitchen.. 1 tab 30 minutes  before each breakfast 4)  Calcium Plus Vit-d  .... 500mg  2 once daily 5)  Hyzaar 100-25 Mg Tabs (Losartan potassium-hctz) .Marland Kitchen.. 1po once  daily 6)  K-tabs 10 Meq Tbcr (Potassium chloride) .... 3 by mouth once daily; 3 @ noon 7)  Flonase 50 Mcg/act Susp (Fluticasone propionate) .... 2 sprays once daily 8)  Mvi  9)  Stalevo 200  .... Three times a day 10)  Fluoxetine Hcl 20 Mg Caps (Fluoxetine hcl) .... One by mouth once daily 11)  Sinemet 25-100 Mg Tabs (Carbidopa-levodopa) .... Three times a day 12)  Zolpidem Tartrate 5 Mg Tabs (Zolpidem tartrate) .... One or two by mouth at bedtime as needed for insomnia 13)  Amitriptyline Hcl 10 Mg Tabs (Amitriptyline hcl) .... One by mouth nightly for one week, to by mouth nightly next week, 3 by mouth nightly thereafter 14)  Vicodin 5-500 Mg Tabs (Hydrocodone-acetaminophen) .... One by mouth every 4 hours as needed for pain  Other Orders: TLB-TSH (Thyroid Stimulating Hormone) (84443-TSH)  Patient Instructions: 1)  take only one fluoxetine 2)  start amitriptyline gradually 3)  discontinue Ultram, take Vicodin instead for pain 4)  watch for excessive sedation 5)  Please schedule a follow-up appointment in 1 month.  Prescriptions: VICODIN 5-500 MG TABS (HYDROCODONE-ACETAMINOPHEN) one by mouth every 4 hours as needed for pain  #60 x 0   Entered and Authorized by:   Nolon Rod. Bryer Gottsch MD   Signed by:   Nolon Rod. Victorian Gunn MD on 05/28/2009   Method used:   Print then Give to Patient   RxID:   [id number] AMITRIPTYLINE HCL 10 MG TABS (AMITRIPTYLINE HCL) one by mouth nightly for one week, to by mouth nightly next week, 3 by mouth nightly thereafter  #90 x 1   Entered and Authorized by:   Elita Quick E. Calogero Geisen MD   Signed by:   Nolon Rod. Norrine Ballester MD on 05/28/2009   Method used:   Electronically to        [facility] [phone]* (retail)       [address]       [place], [place]  [address]       Ph: [phone]       Fax: [phone]   RxID:   [id number]

## 2010-06-18 NOTE — Assessment & Plan Note (Signed)
Summary: eval for scooter/cbs   Vital Signs:  Patient profile:   65 year old female Height:      65 inches Weight:      235.13 pounds BMI:     39.27 Pulse rate:   74 / minute BP sitting:   112 / 62  Vitals Entered By: Shary Decamp (September 12, 2009 2:54 PM) CC: evaluate for scooter   History of Present Illness: needs a prescription for a scooter since 2009 when she was diagnosed with parkinson (and later  PSP), she has getting progressively weaker, mostly in the lower extremities.  She has sustained several falls lately  Current Medications (verified): 1)  Levothyroxine Sodium 125 Mcg Tabs (Levothyroxine Sodium) .Marland Kitchen.. 1 By Mouth Once Daily 2)  Furosemide 20 Mg  Tabs (Furosemide) .Marland Kitchen.. 1 By Mouth Once Daily (Takes 2 Tabs Every 3 Days) 3)  Protonix 40 Mg Tbec (Pantoprazole Sodium) .... Take One Tablet Two Times A Day 4)  Calcium Plus Vit-D .... 500mg  2 Once Daily 5)  Hyzaar 100-25 Mg Tabs (Losartan Potassium-Hctz) .Marland Kitchen.. 1po Once Daily 6)  Flonase 50 Mcg/act Susp (Fluticasone Propionate) .... 2 Sprays Once Daily 7)  Mvi 8)  Amitriptyline Hcl 25 Mg Tabs (Amitriptyline Hcl) .Marland Kitchen.. 1 A Day 9)  Klor-Con M20 20 Meq Cr-Tabs (Potassium Chloride Crys Cr) .... 2 By Mouth Once Daily - Check Potassium in 3 Weeks  Allergies (verified): No Known Drug Allergies  Past History:  Past Medical History: Reviewed history from 03/21/2009 and no changes required. Parkinson, Dx 12-09--neuro Dr Anne Hahn , Dx change to Progressive Supranuclaer Palsy in 2010 Depression GERD osteoporosis Hyperlipidemia Hypothyroidism Hypertension BACK PAIN, CHRONIC   HOARSENESS, CHRONIC   COLONIC POLYPS, HX OF   POSTMENOPAUSAL STATUS  h/o SOB  s/p a negative Cardio-pulmonary stress test 08-27-04, normal PFT s 2006 Breast cancer, hx of 1996 2006  Melanoma in situ nose, s/p Mohs surgery Allergic rhinitis  Past Surgical History: Reviewed history from 02/29/2008 and no changes required. Mastectomy Hysterectomy  and   oophorectomy 1990 STRESS TEST (DECEMBER 2002) FRACTURE, ARM,  Spinal fusion 10-24-07, 6 screws Cataract extraction --R--  Social History: Reviewed history from 10/13/2007 and no changes required. Married no children  Physical Exam  General:  unchanged from previous   Impression & Recommendations:  Problem # 1:  PROGRESSIVE SUPRANUCLEAR PALSY (ICD-333.0) extensive paperwork for a scooter filled  with the assistance of the patient and her husband time spent 15  minutes face-to-face  Complete Medication List: 1)  Levothyroxine Sodium 125 Mcg Tabs (Levothyroxine sodium) .Marland Kitchen.. 1 by mouth once daily 2)  Furosemide 20 Mg Tabs (Furosemide) .Marland Kitchen.. 1 by mouth once daily (takes 2 tabs every 3 days) 3)  Protonix 40 Mg Tbec (Pantoprazole sodium) .... Take one tablet two times a day 4)  Calcium Plus Vit-d  .... 500mg  2 once daily 5)  Hyzaar 100-25 Mg Tabs (Losartan potassium-hctz) .Marland Kitchen.. 1po once daily 6)  Flonase 50 Mcg/act Susp (Fluticasone propionate) .... 2 sprays once daily 7)  Mvi  8)  Amitriptyline Hcl 25 Mg Tabs (Amitriptyline hcl) .Marland Kitchen.. 1 a day 9)  Klor-con M20 20 Meq Cr-tabs (Potassium chloride crys cr) .... 2 by mouth once daily - check potassium in 3 weeks

## 2010-06-18 NOTE — Assessment & Plan Note (Signed)
Summary: 2 mth fu/kdc   Vital Signs:  Patient profile:   65 year old female Height:      65 inches Weight:      235.13 pounds Pulse rate:   70 / minute BP sitting:   120 / 70  Vitals Entered By: Kandice Hams (August 29, 2009 2:54 PM) CC: discuss amitriptyline Comments c/o weight gain, sob, wheezing and dry mouth, acid reflux since increase of amitriptyline   History of Present Illness: here with her husband for a follow-up  Allergies: No Known Drug Allergies  Past History:  Past Medical History: Reviewed history from 03/21/2009 and no changes required. Parkinson, Dx 12-09--neuro Dr Anne Hahn , Dx change to Progressive Supranuclaer Palsy in 2010 Depression GERD osteoporosis Hyperlipidemia Hypothyroidism Hypertension BACK PAIN, CHRONIC   HOARSENESS, CHRONIC   COLONIC POLYPS, HX OF   POSTMENOPAUSAL STATUS  h/o SOB  s/p a negative Cardio-pulmonary stress test 08-27-04, normal PFT s 2006 Breast cancer, hx of 1996 2006  Melanoma in situ nose, s/p Mohs surgery Allergic rhinitis  Past Surgical History: Reviewed history from 02/29/2008 and no changes required. Mastectomy Hysterectomy  and  oophorectomy 1990 STRESS TEST (DECEMBER 2002) FRACTURE, ARM,  Spinal fusion 10-24-07, 6 screws Cataract extraction --R--  Review of Systems       since the last office visit we increased elavil and decrease fluoxetine does she has developed a number of symptoms--- drowsiness, weight gain, increased GERD, dry mouth her mood is actually improved , she is less upset about the diagnosis of PSP, additionally she was having severe left hip pain and now it is a lot better after local injection she is sleeping better  Physical Exam  General:  alert and well-developed.   Neurologic:  no formal neurological exams done Psych:  Oriented X3, good eye contact, not anxious appearing, and not depressed appearing.     Impression & Recommendations:  Problem # 1:  DEPRESSION (ICD-311) mood stable  at this point less upset about the diagnoses of PSP she thinks she can manage without much medication,  plan: d/c  fluoxetine decrease Elavil to a  lower dose, this is helping her with sleep and hopefully at a lower dose she will have less side effects patient to let me know if all of the side effects are not going away after decrease the dose of Elavil The following medications were removed from the medication list:    Fluoxetine Hcl 10 Mg Caps (Fluoxetine hcl) ..... One by mouth daily Her updated medication list for this problem includes:    Amitriptyline Hcl 25 Mg Tabs (Amitriptyline hcl) .Marland Kitchen... 1 a day  Problem # 2:  PROGRESSIVE SUPRANUCLEAR PALSY (ICD-333.0) symptoms no better, slightly worse? next neurology appointment July 2011  Complete Medication List: 1)  Levothyroxine Sodium 125 Mcg Tabs (Levothyroxine sodium) .Marland Kitchen.. 1 by mouth once daily 2)  Furosemide 20 Mg Tabs (Furosemide) .Marland Kitchen.. 1 by mouth once daily (takes 2 tabs every 3 days) 3)  Protonix 40 Mg Tbec (Pantoprazole sodium) .... Take one tablet two times a day 4)  Calcium Plus Vit-d  .... 500mg  2 once daily 5)  Hyzaar 100-25 Mg Tabs (Losartan potassium-hctz) .Marland Kitchen.. 1po once daily 6)  Flonase 50 Mcg/act Susp (Fluticasone propionate) .... 2 sprays once daily 7)  Mvi  8)  Amitriptyline Hcl 25 Mg Tabs (Amitriptyline hcl) .Marland Kitchen.. 1 a day 9)  Klor-con M20 20 Meq Cr-tabs (Potassium chloride crys cr) .... 2 by mouth once daily - check potassium in 3 weeks  Patient  Instructions: 1)  Please schedule a follow-up appointment in 3 months .  Prescriptions: AMITRIPTYLINE HCL 25 MG TABS (AMITRIPTYLINE HCL) 1 a day  #90 x 1   Entered and Authorized by:   Gianmarco Roye E. Tanda Morrissey MD   Signed by:   Nolon Rod. Adian Jablonowski MD on 08/29/2009   Method used:   Electronically to        [facility] [phone]* (retail)       [address]       [place], [place]  [address]       Ph: [phone]       Fax: [phone]   RxID:   [id number]

## 2010-06-18 NOTE — Letter (Signed)
Summary: PSP f/u, [facility]   Imported By: Lanelle Bal 12/25/2009 10:52:46  _____________________________________________________________________  External Attachment:    Type:   Image     Comment:   External Document

## 2010-06-18 NOTE — Letter (Signed)
Summary: [facility]  [facility]   Imported By: Lanelle Bal 06/26/2009 15:54:48  _____________________________________________________________________  External Attachment:    Type:   Image     Comment:   External Document

## 2010-06-18 NOTE — Letter (Signed)
Summary: Letter to  neurology   at [facility]  [address] [place], [place] [address]   Phone: [phone]  Fax: [phone]       04/22/2010  TAYLR MEUTH [address] [place], [place]  [address]  To Dr Anne Hahn, Neurology   This letter is in reference to one of our mutual patients Mrs Shanedra Lave (DOB 2045-12-06).  Please find enclosed my last office visit note, she complained of choking and increased GERD symptoms.  I wonder if her symptoms are related to PSP.  My plan is to get her evaluated by speech therapy.  If you have any other suggestions please let me know.  I think you will see her soon on regular followup.   Sincerely,        Willow Ora MD

## 2010-06-18 NOTE — Assessment & Plan Note (Signed)
Summary: ear wax/acid reflux   Vital Signs:  Patient profile:   65 year old female Weight:      234 pounds Pulse rate:   90 / minute Pulse rhythm:   regular BP sitting:   118 / 72  (left arm) Cuff size:   large  Vitals Entered By: Army Fossa CMA (December 18, 2009 3:19 PM) CC: Pt here to discuss acid reflux- thinks protonix is not working. Comments Unable to hear out of ears.   History of Present Illness: GERD?  Symptoms were well controlled up to recently when she developed a bad taste in her mouth mostly at night  Also 2 weeks history of right ear feeling stopped up She has used over-the-counter "wax  medicine " without help    ROS No dysphasia No odynophagia No abdominal pain No ear   discharge  Current Medications (verified): 1)  Levothyroxine Sodium 125 Mcg Tabs (Levothyroxine Sodium) .Marland Kitchen.. 1 By Mouth Once Daily 2)  Furosemide 20 Mg  Tabs (Furosemide) .Marland Kitchen.. 1 By Mouth Once Daily (Takes 2 Tabs Every 3 Days) 3)  Protonix 40 Mg Tbec (Pantoprazole Sodium) .... Take One Tablet Two Times A Day 4)  Calcium Plus Vit-D .... 500mg  2 Once Daily 5)  Hyzaar 100-25 Mg Tabs (Losartan Potassium-Hctz) .Marland Kitchen.. 1po Once Daily 6)  Flonase 50 Mcg/act Susp (Fluticasone Propionate) .... 2 Sprays Once Daily 7)  Mvi 8)  Amitriptyline Hcl 25 Mg Tabs (Amitriptyline Hcl) .Marland Kitchen.. 1 A Day 9)  Klor-Con M20 20 Meq Cr-Tabs (Potassium Chloride Crys Cr) .... 2 By Mouth Once Daily -  Allergies (verified): No Known Drug Allergies  Past History:  Past Medical History: Reviewed history from 03/21/2009 and no changes required. Parkinson, Dx 12-09--neuro Dr Anne Hahn , Dx change to Progressive Supranuclaer Palsy in 2010 Depression GERD osteoporosis Hyperlipidemia Hypothyroidism Hypertension BACK PAIN, CHRONIC   HOARSENESS, CHRONIC   COLONIC POLYPS, HX OF   POSTMENOPAUSAL STATUS  h/o SOB  s/p a negative Cardio-pulmonary stress test 08-27-04, normal PFT s 2006 Breast cancer, hx of 1996 2006  Melanoma  in situ nose, s/p Mohs surgery Allergic rhinitis  Past Surgical History: Reviewed history from 02/29/2008 and no changes required. Mastectomy Hysterectomy  and  oophorectomy 1990 STRESS TEST (DECEMBER 2002) FRACTURE, ARM,  Spinal fusion 10-24-07, 6 screws Cataract extraction --R--  Social History: Reviewed history from 10/13/2007 and no changes required. Married no children  Physical Exam  General:  alert and well-developed.   Ears:  L ear normal.   abundant  wax on the right side Abdomen:  soft, non-tender, no distention, no masses, no guarding, and no rigidity.     Impression & Recommendations:  Problem # 1:  GERD (ICD-530.81) previously well controlled Information regards GERD prevention provided takes  Protonix on empty stomach most days Recommend Protonix at least 30 minutes before breakfast and before dinner daily Okay to take TUMS or similar antiacids at bedtime Will call if not better with this strategy Her updated medication list for this problem includes:    Protonix 40 Mg Tbec (Pantoprazole sodium) .Marland Kitchen... Take one tablet two times a day  Problem # 2:  HYPERTHYROIDISM (ICD-242.90) labs Labs Reviewed: TSH: 0.65 (05/28/2009)     Orders: TLB-TSH (Thyroid Stimulating Hormone) (84443-TSH)  Problem # 3:  HYPERTENSION (ICD-401.9) BP well controlled, labs Her updated medication list for this problem includes:    Furosemide 20 Mg Tabs (Furosemide) .Marland Kitchen... 1 by mouth once daily (takes 2 tabs every 3 days)    Hyzaar 100-25  Mg Tabs (Losartan potassium-hctz) .Marland Kitchen... 1po once daily    BP today: 118/72 Prior BP: 112/62 (09/12/2009)  Labs Reviewed: K+: 3.8 (06/25/2009) Creat: : 0.7 (05/28/2009)   Chol: 233 (03/09/2007)   HDL: 33.9 (03/09/2007)   LDL: DEL (03/09/2007)   TG: 391 (03/09/2007)  Orders: Venipuncture ([address]) TLB-BMP (Basic Metabolic Panel-BMET) (80048-METABOL)  Problem # 4:  CERUMEN IMPACTION (ICD-380.4)  moderate amount of cerumen  partially removed  with a spoon, small bleeding noted then we performed  a lavage , ear was left  completely clean  Orders: Cerumen Impaction Removal (60454)  Complete Medication List: 1)  Levothyroxine Sodium 125 Mcg Tabs (Levothyroxine sodium) .Marland Kitchen.. 1 by mouth once daily 2)  Furosemide 20 Mg Tabs (Furosemide) .Marland Kitchen.. 1 by mouth once daily (takes 2 tabs every 3 days) 3)  Protonix 40 Mg Tbec (Pantoprazole sodium) .... Take one tablet two times a day 4)  Calcium Plus Vit-d  .... 500mg  2 once daily 5)  Hyzaar 100-25 Mg Tabs (Losartan potassium-hctz) .Marland Kitchen.. 1po once daily 6)  Flonase 50 Mcg/act Susp (Fluticasone propionate) .... 2 sprays once daily 7)  Mvi  8)  Amitriptyline Hcl 25 Mg Tabs (Amitriptyline hcl) .Marland Kitchen.. 1 a day 9)  Klor-con M20 20 Meq Cr-tabs (Potassium chloride crys cr) .... 2 by mouth once daily -  Patient Instructions: 1)  Please schedule a follow-up appointment in 4 months .

## 2010-06-18 NOTE — Letter (Signed)
Summary: Product Description for [facility]  Product Description for [facility]   Imported By: Lanelle Bal 10/07/2009 13:08:39  _____________________________________________________________________  External Attachment:    Type:   Image     Comment:   External Document

## 2010-06-18 NOTE — Assessment & Plan Note (Signed)
Summary: FOLLOWUP ON POTASSIUM///SPH   Vital Signs:  Patient profile:   65 year old female Weight:      230 pounds Pulse rate:   92 / minute Pulse rhythm:   regular BP sitting:   134 / 86  (left arm) Cuff size:   large  Vitals Entered By: Army Fossa CMA (February 12, 2010 2:58 PM) CC: pt here for f/u on meds Comments [facility] [address]   History of Present Illness: her last potassium was quite low, what we did was the following: -continue with Lasix -increase potassium 20 mEq to 2 tablets b.i.d. -discontinue  Hyzaar 100-25  and take instead losartan 100 mg one p.o. daily the patient however  developed edema   again so she went back to Hyzaar.  also wonders if she needs to continue on the Elavil which was prescribed for depression related to PSP.   ROS does watch Na intake edemna was caming back so she went bak to  Hyzaar 4 days ago  denies CP, SOB, leg cramping   depression symptoms well controlled  Current Medications (verified): 1)  Levothyroxine Sodium 125 Mcg Tabs (Levothyroxine Sodium) .Marland Kitchen.. 1 By Mouth Once Daily 2)  Furosemide 20 Mg  Tabs (Furosemide) .... Hold 3)  Protonix 40 Mg Tbec (Pantoprazole Sodium) .... Take One Tablet Two Times A Day 4)  Calcium Plus Vit-D .... 500mg  2 Once Daily 5)  Losartan Potassium 100 Mg Tabs (Losartan Potassium) .Marland Kitchen.. 1 By Mouth Daily. 6)  Flonase 50 Mcg/act Susp (Fluticasone Propionate) .... 2 Sprays Once Daily 7)  Mvi 8)  Amitriptyline Hcl 25 Mg Tabs (Amitriptyline Hcl) .Marland Kitchen.. 1 A Day 9)  Klor-Con M20 20 Meq Cr-Tabs (Potassium Chloride Crys Cr) .... 2 By Mouth Two Times A Day. 10)  Beside Toliet .... Please Allow Pt To Get 1.  Allergies (verified): No Known Drug Allergies  Past History:  Past Medical History: Reviewed history from 03/21/2009 and no changes required. Parkinson, Dx 12-09--neuro Dr Anne Hahn , Dx change to Progressive Supranuclaer Palsy in  2010 Depression GERD osteoporosis Hyperlipidemia Hypothyroidism Hypertension BACK PAIN, CHRONIC   HOARSENESS, CHRONIC   COLONIC POLYPS, HX OF   POSTMENOPAUSAL STATUS  h/o SOB  s/p a negative Cardio-pulmonary stress test 08-27-04, normal PFT s 2006 Breast cancer, hx of 1996 2006  Melanoma in situ nose, s/p Mohs surgery Allergic rhinitis  Past Surgical History: Reviewed history from 02/29/2008 and no changes required. Mastectomy Hysterectomy  and  oophorectomy 1990 STRESS TEST (DECEMBER 2002) FRACTURE, ARM,  Spinal fusion 10-24-07, 6 screws Cataract extraction --R--  Social History: Reviewed history from 10/13/2007 and no changes required. Married no children  Physical Exam  General:  alert and well-developed.   Lungs:  normal respiratory effort, no intercostal retractions, no accessory muscle use, and normal breath sounds.   Heart:  normal rate, regular rhythm, and no murmur.   Extremities:  no edema Psych:  not anxious appearing and not depressed appearing.     Impression & Recommendations:  Problem # 1:  HYPERTENSION (ICD-401.9) we recently switched her from Hyzaar to losartan due to hypokalemia, however she developed edema and again so she went back to Hyzaar. At this point will keep her on Hyzaar and recheck a BMP next week. Evidently, she is benefiting from both furosemide and hydrochlorothiazide consequently will have to replace potassium aggressively   Her updated medication list for this problem includes:    Furosemide 20 Mg Tabs (Furosemide) ..... One tablet a day    Hyzaar  100-25 Mg Tabs (Losartan potassium-hctz) ..... One by mouth daily  Problem # 2:  PROGRESSIVE SUPRANUCLEAR PALSY (ICD-333.0) on Elavil for depression, symptoms well controlled. No change  Complete Medication List: 1)  Levothyroxine Sodium 125 Mcg Tabs (Levothyroxine sodium) .Marland Kitchen.. 1 by mouth once daily 2)  Furosemide 20 Mg Tabs (Furosemide) .... One tablet a day 3)  Protonix 40 Mg Tbec  (Pantoprazole sodium) .... Take one tablet two times a day 4)  Calcium Plus Vit-d  .... 500mg  2 once daily 5)  Hyzaar 100-25 Mg Tabs (Losartan potassium-hctz) .... One by mouth daily 6)  Flonase 50 Mcg/act Susp (Fluticasone propionate) .... 2 sprays once daily 7)  Mvi  8)  Amitriptyline Hcl 25 Mg Tabs (Amitriptyline hcl) .Marland Kitchen.. 1 a day 9)  Klor-con M20 20 Meq Cr-tabs (Potassium chloride crys cr) .... 2 by mouth two times a day. 10)  Beside Toliet  .... Please allow pt to get 1.  Patient Instructions: 1)  continue taking your medicines per the list below 2)  Come back next week for a BMP, dx  hypertension

## 2010-06-18 NOTE — Miscellaneous (Signed)
Summary: [facility]  [facility]   Imported By: Lanelle Bal 10/23/2009 12:00:21  _____________________________________________________________________  External Attachment:    Type:   Image     Comment:   External Document

## 2010-06-18 NOTE — Letter (Signed)
Summary: [facility]  [facility]   Imported By: Lanelle Bal 10/22/2009 13:04:27  _____________________________________________________________________  External Attachment:    Type:   Image     Comment:   External Document

## 2010-06-18 NOTE — Letter (Signed)
Summary: [facility]   Imported By: Lanelle Bal 09/19/2009 10:43:48  _____________________________________________________________________  External Attachment:    Type:   Image     Comment:   External Document

## 2010-06-18 NOTE — Letter (Signed)
Summary: [facility]  [facility]   Imported By: Lanelle Bal 10/02/2009 13:10:14  _____________________________________________________________________  External Attachment:    Type:   Image     Comment:   External Document

## 2010-06-18 NOTE — Progress Notes (Signed)
Summary: increase acid reflux   Phone Note Call from Patient Call back at Home Phone [phone]   Caller: Patient Summary of Call: patient called says since increasing amitriptyline at 06/30/09 office visit she has had increased acid reflux symptoms and not getting any relief w/ protonix says she was reading prescription insert and notice that this was one of the side effects.........Marland KitchenDoristine Devoid  July 11, 2009 4:05 PM   Follow-up for Phone Call        I am not sure if elavil  is causing her symptoms to increase Protonix to twice a day x 1 week they go back to one a day. Let us know if no better   Follow-up by: Jone Panebianco E. Mansel Strother MD,  July 11, 2009 4:54 PM  Additional Follow-up for Phone Call Additional follow up Details #1::        spoke w/ patient aware of recommendations and will call if no better.....Marland KitchenMarland KitchenDoristine Devoid  July 11, 2009 5:00 PM     New/Updated Medications: PROTONIX 40 MG TBEC (PANTOPRAZOLE SODIUM) take one tablet two times a day Prescriptions: PROTONIX 40 MG TBEC (PANTOPRAZOLE SODIUM) take one tablet two times a day  #60 x 1   Entered by:   Doristine Devoid   Authorized by:   Nolon Rod. Krystale Rinkenberger MD   Signed by:   Doristine Devoid on 07/11/2009   Method used:   Electronically to        [facility] [phone]* (retail)       [address]       [place], [place]  [address]       Ph: [phone]       Fax: [phone]   RxID:   [id number]

## 2010-06-18 NOTE — Progress Notes (Signed)
Summary: Rx for Bedside Toilet  Phone Note Call from Patient   Caller: Spouse Summary of Call: Spouse is requesting an rx for a bedside toilet for pt. Please advise [phone] or [phone] Initial call taken by: Lavell Islam,  January 26, 2010 12:22 PM  Follow-up for Phone Call        Spoke with pt they would like the rx to mailed to there house. Army Fossa CMA  January 27, 2010 8:05 AM     New/Updated Medications: * BESIDE TOLIET please allow pt to get 1. Prescriptions: BESIDE TOLIET please allow pt to get 1.  #1 x 0   Entered by:   Army Fossa CMA   Authorized by:   Nolon Rod. Paz MD   Signed by:   Army Fossa CMA on 01/27/2010   Method used:   Print then Mail to Patient   RxID:   [id number]

## 2010-06-18 NOTE — Assessment & Plan Note (Signed)
Summary: rto 4 months/cbs   Vital Signs:  Patient profile:   65 year old female Weight:      232.50 pounds Pulse rate:   94 / minute Pulse rhythm:   regular BP sitting:   128 / 86  (left arm) Cuff size:   large  Vitals Entered By: Army Fossa CMA (April 21, 2010 2:06 PM) CC: 4 month f/u- not fasting Comments would like pain med for her back. [facility] [address]    History of Present Illness: ROV here with her husband Continue with moderate to severe GERD symptoms despite taking PPIs twice a day. She describes burning in her throat  She also complains of back pain for the last  3  months, she suffered from back pain for a while but this problem has been quiet for several months until  recently. Pain is located at the low back without radiation    Current Medications (verified): 1)  Levothyroxine Sodium 125 Mcg Tabs (Levothyroxine Sodium) .Marland Kitchen.. 1 By Mouth Once Daily 2)  Furosemide 20 Mg  Tabs (Furosemide) .... One Tablet A Day 3)  Protonix 40 Mg Tbec (Pantoprazole Sodium) .... Take One Tablet Two Times A Day 4)  Calcium Plus Vit-D .... 500mg  2 Once Daily 5)  Hyzaar 100-25 Mg Tabs (Losartan Potassium-Hctz) .... One By Mouth Daily 6)  Flonase 50 Mcg/act Susp (Fluticasone Propionate) .... 2 Sprays Once Daily 7)  Mvi 8)  Amitriptyline Hcl 25 Mg Tabs (Amitriptyline Hcl) .Marland Kitchen.. 1 A Day 9)  Klor-Con M20 20 Meq Cr-Tabs (Potassium Chloride Crys Cr) .... 2 By Mouth Two Times A Day. 10)  Beside Toliet .... Please Allow Pt To Get 1.  Allergies (verified): No Known Drug Allergies  Past History:  Past Medical History: Reviewed history from 03/21/2009 and no changes required. Parkinson, Dx 12-09--neuro Dr Anne Hahn , Dx change to Progressive Supranuclaer Palsy in 2010 Depression GERD osteoporosis Hyperlipidemia Hypothyroidism Hypertension BACK PAIN, CHRONIC   HOARSENESS, CHRONIC   COLONIC POLYPS, HX OF   POSTMENOPAUSAL STATUS  h/o SOB  s/p a negative Cardio-pulmonary stress  test 08-27-04, normal PFT s 2006 Breast cancer, hx of 1996 2006  Melanoma in situ nose, s/p Mohs surgery Allergic rhinitis  Past Surgical History: Reviewed history from 02/29/2008 and no changes required. Mastectomy Hysterectomy  and  oophorectomy 1990 STRESS TEST (DECEMBER 2002) FRACTURE, ARM,  Spinal fusion 10-24-07, 6 screws Cataract extraction --R--  Social History: Reviewed history from 10/13/2007 and no changes required. Married no children  Review of Systems       he denies dysphagia or odynophagia Complaining of choking easily, denies fever cough  Physical Exam  General:  alert and well-developed.  frequent cough nontoxic. Patient is having a harder time talking Lungs:  normal respiratory effort, no intercostal retractions, no accessory muscle use, and normal breath sounds.   Heart:  normal rate, regular rhythm, and no murmur.   Extremities:  no edema   Impression & Recommendations:  Problem # 1:  BACK PAIN, CHRONIC (ICD-724.5) history of on and off back pain. At some point she used to see Dr. Vear Clock, we discontinue Vicodin from her medication list  on 2/11 and she was not taking it. Today reports that they back pain has resurfaced for the last 3 months. Plan: Restart hydrocodone  Her updated medication list for this problem includes:    Vicodin 5-500 Mg Tabs (Hydrocodone-acetaminophen) .Marland Kitchen... 1 by mouth every 4 hours as needed for pain  Problem # 2:  GERD (ICD-530.81)  not  well-controlled at present increasing symptoms related to PSP? she also choking frequently. Plan:  Swallow study, continued PPIs, consider GI re-evaluation we'll CC this note to NEUROLGY Her updated medication list for this problem includes:    Protonix 40 Mg Tbec (Pantoprazole sodium) .Marland Kitchen... Take one tablet two times a day  Orders: Speech Therapy (Speech Therapy)  Problem # 3:  PROGRESSIVE SUPRANUCLEAR PALSY (ICD-333.0)  has a power wheelchair at home, thinks also needs a smaller  wheel chair, will send me a Rx to sign if needed   Orders: Speech Therapy (Speech Therapy)  Problem # 4:  HYPERLIPIDEMIA (ICD-272.4) on no treatment at present   Problem # 5:  HYPERTENSION (ICD-401.9) well-controlled, last  potassium normal. Her updated medication list for this problem includes:    Furosemide 20 Mg Tabs (Furosemide) ..... One tablet a day    Hyzaar 100-25 Mg Tabs (Losartan potassium-hctz) ..... One by mouth daily  BP today: 128/86 Prior BP: 134/86 (02/12/2010)  Labs Reviewed: K+: 3.6 (02/19/2010) Creat: : 0.8 (02/19/2010)   Chol: 233 (03/09/2007)   HDL: 33.9 (03/09/2007)   LDL: DEL (03/09/2007)   TG: 391 (03/09/2007)  Complete Medication List: 1)  Levothyroxine Sodium 125 Mcg Tabs (Levothyroxine sodium) .Marland Kitchen.. 1 by mouth once daily 2)  Furosemide 20 Mg Tabs (Furosemide) .... One tablet a day 3)  Protonix 40 Mg Tbec (Pantoprazole sodium) .... Take one tablet two times a day 4)  Calcium Plus Vit-d  .... 500mg  2 once daily 5)  Hyzaar 100-25 Mg Tabs (Losartan potassium-hctz) .... One by mouth daily 6)  Flonase 50 Mcg/act Susp (Fluticasone propionate) .... 2 sprays once daily 7)  Mvi  8)  Amitriptyline Hcl 25 Mg Tabs (Amitriptyline hcl) .Marland Kitchen.. 1 a day 9)  Klor-con M20 20 Meq Cr-tabs (Potassium chloride crys cr) .... 2 by mouth two times a day. 10)  Beside Toliet  .... Please allow pt to get 1. 11)  Vicodin 5-500 Mg Tabs (Hydrocodone-acetaminophen) .Marland Kitchen.. 1 by mouth every 4 hours as needed for pain  Patient Instructions: 1)  Please schedule a follow-up appointment in 3 months .  Prescriptions: VICODIN 5-500 MG TABS (HYDROCODONE-ACETAMINOPHEN) 1 by mouth every 4 hours as needed for pain  #60 x 0   Entered and Authorized by:   Nolon Rod. Paz MD   Signed by:   Nolon Rod. Paz MD on 04/21/2010   Method used:   Print then Give to Patient   RxID:   [id number]    Orders Added: 1)  Speech Therapy [Speech Therapy] 2)  Est. Patient Level III [13244]

## 2010-06-18 NOTE — Letter (Signed)
Summary: [facility]  [facility]   Imported By: Lennie Odor 02-10-2010 11:25:05  _____________________________________________________________________  External Attachment:    Type:   Image     Comment:   External Document

## 2010-06-18 NOTE — Assessment & Plan Note (Signed)
Summary: one mth fu/ns/kdc   Vital Signs:  Patient profile:   65 year old female Height:      65 inches Weight:      220 pounds Pulse rate:   94 / minute BP sitting:   100 / 60  Vitals Entered By: Shary Decamp (June 30, 2009 2:41 PM) CC: rov   History of Present Illness:  here with her husband for a follow-up from last office visit  Current Medications (verified): 1)  Levothyroxine Sodium 125 Mcg Tabs (Levothyroxine Sodium) .Marland Kitchen.. 1 By Mouth Once Daily 2)  Furosemide 20 Mg  Tabs (Furosemide) .Marland Kitchen.. 1 By Mouth Once Daily (Takes 2 Tabs Every 3 Days) 3)  Protonix 40 Mg Tbec (Pantoprazole Sodium) .Marland Kitchen.. 1 Tab 30 Minutes  Before Each Breakfast 4)  Calcium Plus Vit-D .... 500mg  2 Once Daily 5)  Hyzaar 100-25 Mg Tabs (Losartan Potassium-Hctz) .Marland Kitchen.. 1po Once Daily 6)  Flonase 50 Mcg/act Susp (Fluticasone Propionate) .... 2 Sprays Once Daily 7)  Mvi 8)  Fluoxetine Hcl 20 Mg Caps (Fluoxetine Hcl) .... One By Mouth Once Daily 9)  Amitriptyline Hcl 10 Mg Tabs (Amitriptyline Hcl) .... One By Mouth Nightly For One Week, To By Mouth Nightly Next Week, 3 By Mouth Nightly Thereafter 10)  Klor-Con M20 20 Meq Cr-Tabs (Potassium Chloride Crys Cr) .... 2 By Mouth Once Daily - Check Potassium in 3 Weeks  Allergies (verified): No Known Drug Allergies  Past History:  Past Medical History: Reviewed history from 03/21/2009 and no changes required. Parkinson, Dx 12-09--neuro Dr Anne Hahn , Dx change to Progressive Supranuclaer Palsy in 2010 Depression GERD osteoporosis Hyperlipidemia Hypothyroidism Hypertension BACK PAIN, CHRONIC   HOARSENESS, CHRONIC   COLONIC POLYPS, HX OF   POSTMENOPAUSAL STATUS  h/o SOB  s/p a negative Cardio-pulmonary stress test 08-27-04, normal PFT s 2006 Breast cancer, hx of 1996 2006  Melanoma in situ nose, s/p Mohs surgery Allergic rhinitis  Past Surgical History: Reviewed history from 02/29/2008 and no changes required. Mastectomy Hysterectomy  and  oophorectomy  1990 STRESS TEST (DECEMBER 2002) FRACTURE, ARM,  Spinal fusion 10-24-07, 6 screws Cataract extraction --R--  Review of Systems       --as far as the hip pain, she is still hurting, her neurologist ordered a MRI of the hip which is pending not taking ultram or vicodin , they didn't help --we decreased the dose of Paxil that she was having sweats and start her on Elavil she is tolerating these medications well no further sweats her mood is okay, no as sad-blue  as before.  If anything, is the pain that is bothering her the most  Physical Exam  General:  alert and well-developed.   Lungs:  normal respiratory effort, no intercostal retractions, no accessory muscle use, and normal breath sounds.   Heart:  normal rate, regular rhythm, and no murmur.   Msk:  sitting in a wheelchair  Extremities:  no edema Psych:  seems in good spirits today, alert oriented x 3   Impression & Recommendations:  Problem # 1:  HIP PAIN, LEFT (ICD-719.45) to have a MRI as soon not taking ultram or vicodin , they didn't help The following medications were removed from the medication list:    Vicodin 5-500 Mg Tabs (Hydrocodone-acetaminophen) ..... One by mouth every 4 hours as needed for pain  Problem # 2:  DEPRESSION (ICD-311) improving decrease fluoxetine from 20 mg to to 10 mg  increase amitriptyline dose from 30  to 50 Her updated medication list for  this problem includes:    Fluoxetine Hcl 10 Mg Caps (Fluoxetine hcl) ..... One by mouth daily    Amitriptyline Hcl 50 Mg Tabs (Amitriptyline hcl) ..... One by mouth daily  Complete Medication List: 1)  Levothyroxine Sodium 125 Mcg Tabs (Levothyroxine sodium) .Marland Kitchen.. 1 by mouth once daily 2)  Furosemide 20 Mg Tabs (Furosemide) .Marland Kitchen.. 1 by mouth once daily (takes 2 tabs every 3 days) 3)  Protonix 40 Mg Tbec (Pantoprazole sodium) .Marland Kitchen.. 1 tab 30 minutes  before each breakfast 4)  Calcium Plus Vit-d  .... 500mg  2 once daily 5)  Hyzaar 100-25 Mg Tabs (Losartan  potassium-hctz) .Marland Kitchen.. 1po once daily 6)  Flonase 50 Mcg/act Susp (Fluticasone propionate) .... 2 sprays once daily 7)  Mvi  8)  Fluoxetine Hcl 10 Mg Caps (Fluoxetine hcl) .... One by mouth daily 9)  Amitriptyline Hcl 50 Mg Tabs (Amitriptyline hcl) .... One by mouth daily 10)  Klor-con M20 20 Meq Cr-tabs (Potassium chloride crys cr) .... 2 by mouth once daily - check potassium in 3 weeks  Patient Instructions: 1)  decrease fluoxetine from 20 mg to to 10 mg  2)  increase amitriptyline dose from 30  to 50 3)  Please schedule a follow-up appointment in 2 months.  Prescriptions: AMITRIPTYLINE HCL 50 MG TABS (AMITRIPTYLINE HCL) one by mouth daily  #30 x 3   Entered and Authorized by:   Elita Quick E. Kayton Dunaj MD   Signed by:   Nolon Rod. Lety Cullens MD on 06/30/2009   Method used:   Electronically to        [facility] [phone]* (retail)       [address]       [place], [place]  [address]       Ph: [phone]       Fax: [phone]   RxID:   [id number] FLUOXETINE HCL 10 MG CAPS (FLUOXETINE HCL) one by mouth daily  #30 x 3   Entered and Authorized by:   Nolon Rod. Laird Runnion MD   Signed by:   Nolon Rod. Alycia Cooperwood MD on 06/30/2009   Method used:   Electronically to        [facility] [phone]* (retail)       [address]       [place], [place]  [address]       Ph: [phone]       Fax: [phone]   RxID:   [id number]

## 2010-06-18 NOTE — Miscellaneous (Signed)
Summary: [facility]  [facility]   Imported By: Lanelle Bal 10/20/2009 14:12:34  _____________________________________________________________________  External Attachment:    Type:   Image     Comment:   External Document

## 2010-06-18 NOTE — Progress Notes (Signed)
Summary: FYI  Phone Note Call from Patient Call back at [facility] Phone [phone]   Summary of Call: Patient has an appt on 09/19/09 to evaluate for scooter.  Patient has fallen 10 times in the last 2 weeks. Husband would like to move appt up.  Advised husband that she needs a home health safety eval (PT/OT).  Will send order to [facility] & keep appt with Dr. Drue Novel for 09/19/09.   Shary Decamp  September 09, 2009 9:27 AM   Follow-up for Phone Call        I can see her sooner than 09/19/09 , please arrange Follow-up by: [facility] E. Mackenzy Grumbine MD,  September 09, 2009 5:46 PM  Additional Follow-up for Phone Call Additional follow up Details #1::        patient's husband call for phone # for home health because they are to come out today he needs to reschedule - gave him the # [phone] per stacia - also rescheduled appt for scooter eval [phone]  - he will cantact crystal at [facility] store Additional Follow-up by: Okey Regal [place],  September 10, 2009 10:43 AM

## 2010-06-18 NOTE — Letter (Signed)
Summary: Additional Documentation for [facility]  Additional Documentation for [facility]   Imported By: Lanelle Bal 09/26/2009 14:10:19  _____________________________________________________________________  External Attachment:    Type:   Image     Comment:   External Document

## 2010-06-18 NOTE — Letter (Signed)
Summary: Colonoscopy-Changed to Office Visit Letter  [facility]  [address] [place], [place] [address]   Phone: [phone]  Fax: [phone]      Oct 08, 2009 MRN: [id number]   [facility] [address] [place], [place]  [address]   Dear Ms. Gaiser,   According to our records, it is time for you to schedule a Colonoscopy. However, after reviewing your medical record, I feel that an office visit would be most appropriate to more completely evaluate you and determine your need for a repeat procedure.  Please call [phone] (option #2) at your convenience to schedule an office visit. If you have any questions, concerns, or feel that this letter is in error, we would appreciate your call.   Sincerely,  Iva Boop, M.D.  [facility] [phone]

## 2010-06-18 NOTE — Progress Notes (Signed)
Summary: Critical lab   Phone Note From Other Clinic   Summary of Call: Took call from [facility], patient potassium is critical low at 2.8. They will fax results asap. Please advise. Initial call taken by: Lucious Groves CMA,  December 19, 2009 1:52 PM  Follow-up for Phone Call        please check if she is taking Lasix as prescribed Markice Torbert E. Tovah Slavick MD  December 19, 2009 3:28 PM   Additional Follow-up for Phone Call Additional follow up Details #1::        Left message for pt to call back. Army Fossa CMA  December 19, 2009 3:34 PM     Additional Follow-up for Phone Call Additional follow up Details #2::    Spoke with pts husband she was only taking it once a day. Informed him she needs to be taking it once a day except every 3 days take 2 pills.Army Fossa CMA  December 19, 2009 4:13 PM  actually because the potassium is low, we need her to d/c  Lasix For 3 days needs a  extra  potassium pill ( in addition to her regular 2 tablets daily) Check a potassium in 2 weeks If she develops swelling or fluid retention, we'll have to put her back on Lasix and adjust her other medications Kwynn Schlotter E. Shaquana Buel MD  December 19, 2009 4:31 PM   Additional Follow-up for Phone Call Additional follow up Details #3:: Details for Additional Follow-up Action Taken: I spoke with pt he is aware of new instructions. Army Fossa CMA  December 19, 2009 4:34 PM   New/Updated Medications: FUROSEMIDE 20 MG  TABS (FUROSEMIDE) HOLD

## 2010-07-03 ENCOUNTER — Encounter: Payer: Self-pay | Admitting: Internal Medicine

## 2010-07-12 IMAGING — CT CT BIOPSY
1 series · 15 of 32 positions shown, 19 images · non-contrast
Comparison: none

CLINICAL DATA: Left hip pain.  Iliopsoas tendonitis on MRI.

[Series 2: routine pelvis · axial · 0.70mm/px · z∈[-194,-129]mm · 15 of 32 slices shown, 19 images]
[im 3/32  soft-tissue]
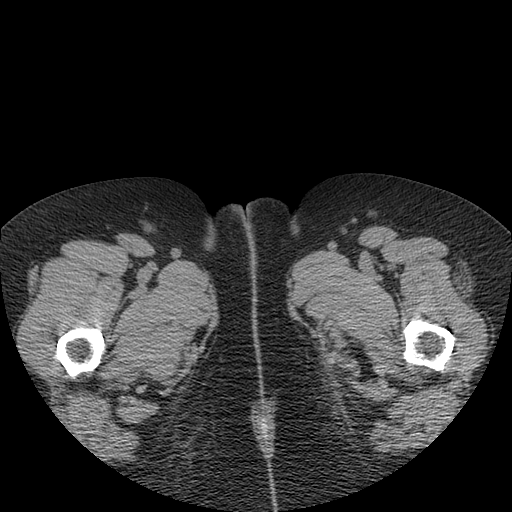
[im 3/32  bone]
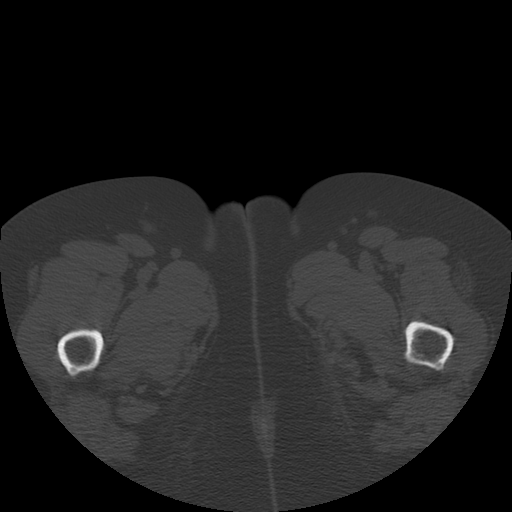
[im 5/32  soft-tissue]
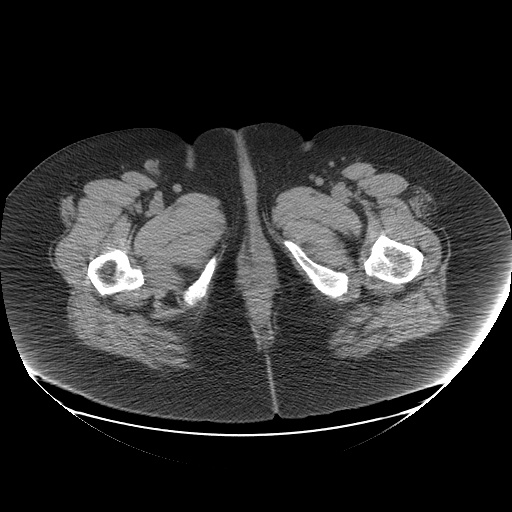
[im 7/32  soft-tissue]
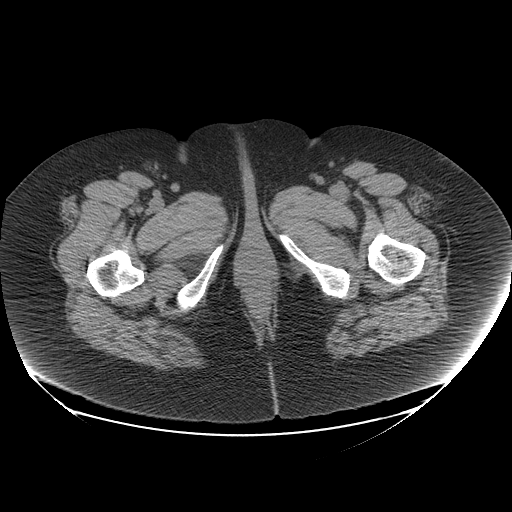
[im 10/32  soft-tissue]
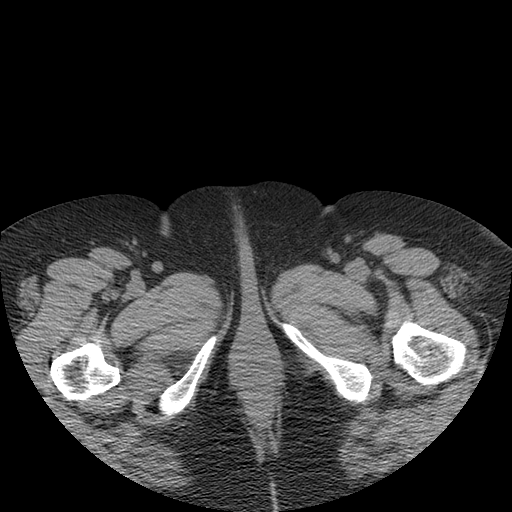
[im 12/32  soft-tissue]
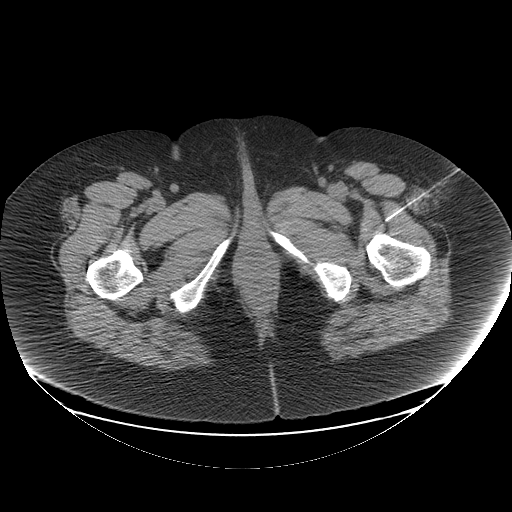
[im 14/32  soft-tissue]
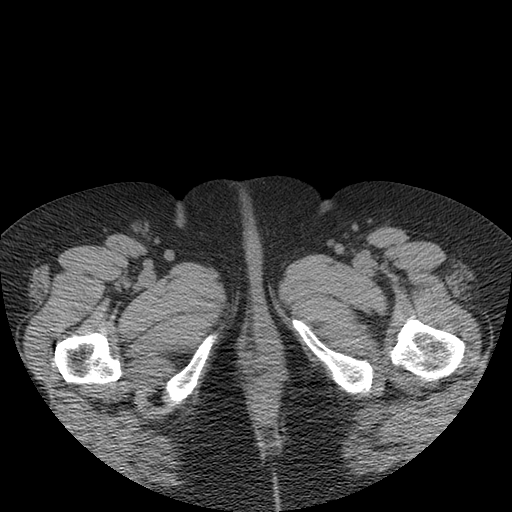
[im 17/32  soft-tissue]
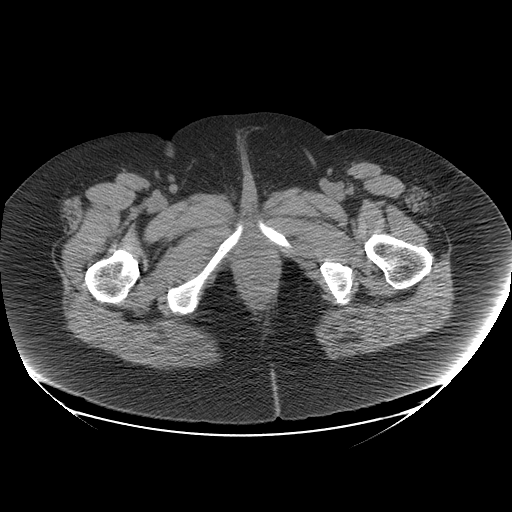
[im 19/32  soft-tissue]
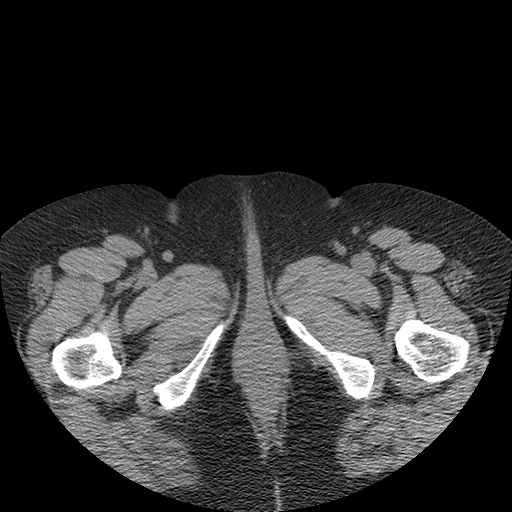
[im 21/32  soft-tissue]
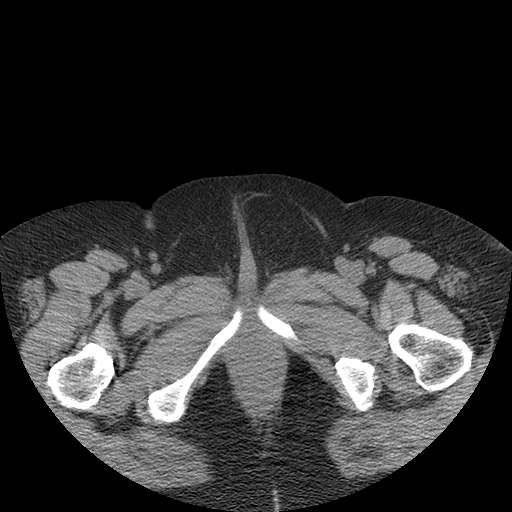
[im 21/32  bone]
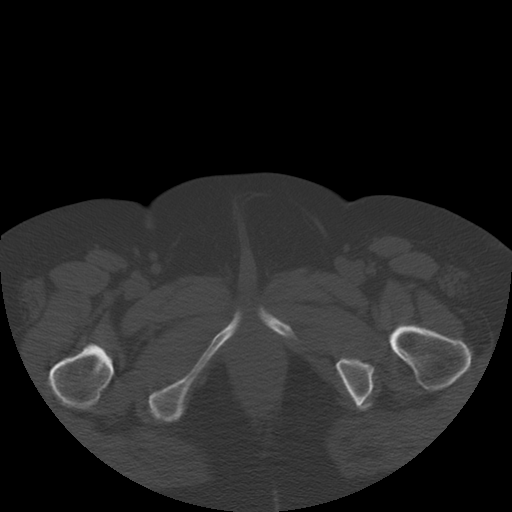
[im 23/32  soft-tissue]
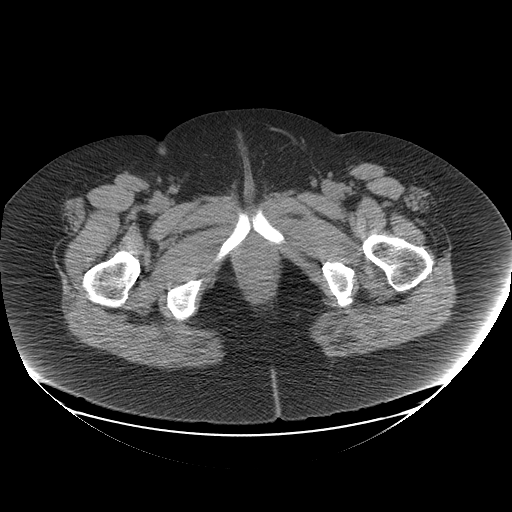
[im 26/32  soft-tissue]
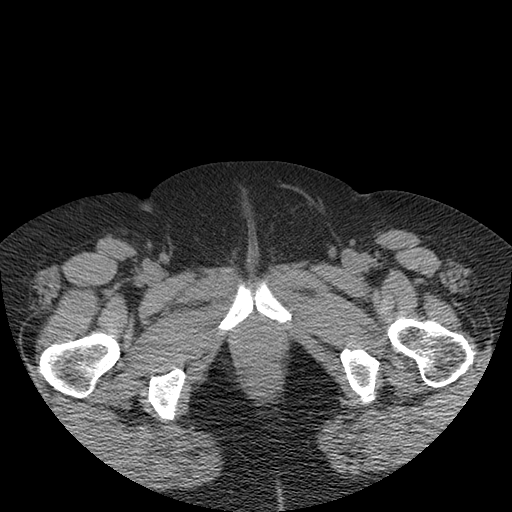
[im 28/32  soft-tissue]
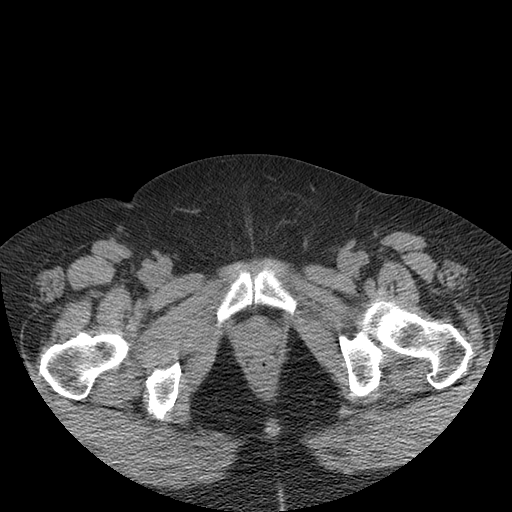
[im 28/32  lung]
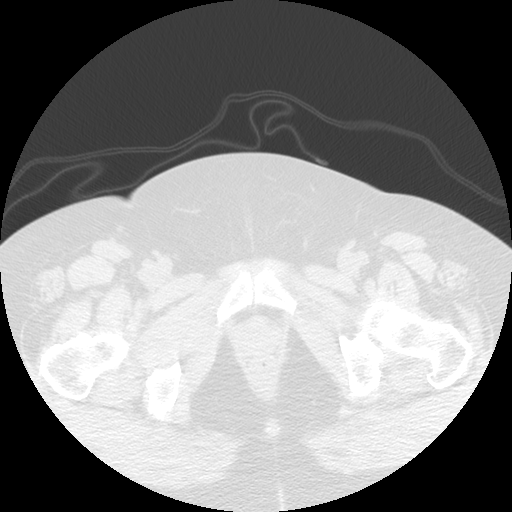
[im 29/32  lung]
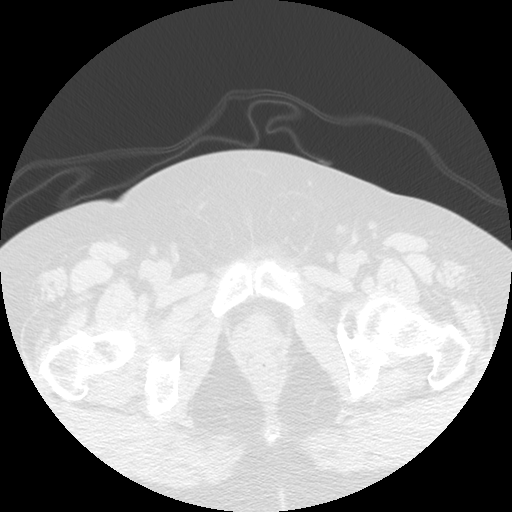
[im 30/32  soft-tissue]
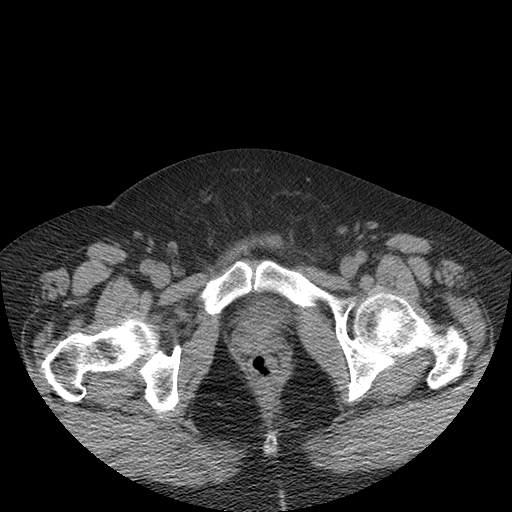
[im 30/32  lung]
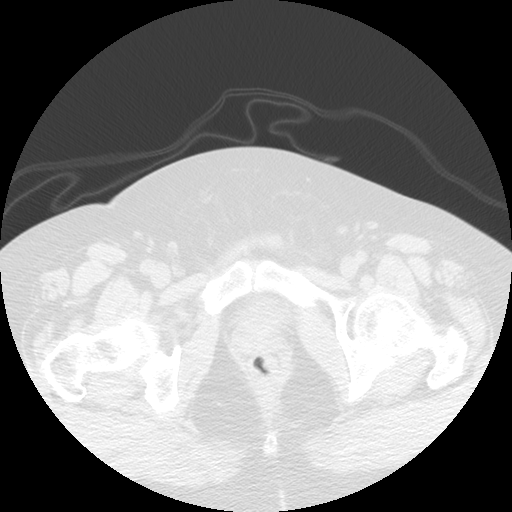
[im 31/32  lung]
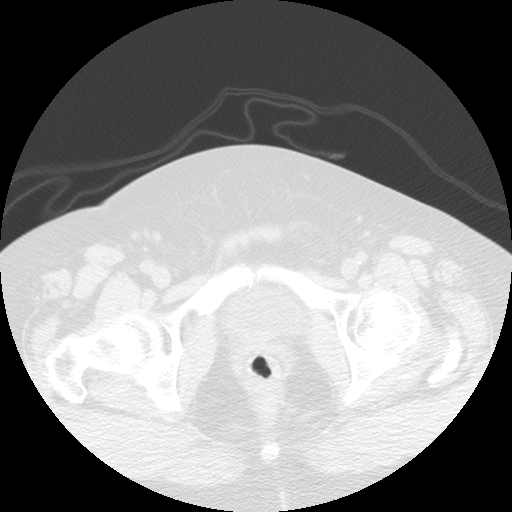

[15 of 32 positions shown; findings below may reference images not displayed]

CT GUIDED LEFT ILIOPSOAS TENDON SHEATH INJECTION.

Contrast:  None

Procedure:  The procedure, risks, benefits, and alternatives were
explained to the patient.  Questions regarding the procedure were
encouraged and answered.  The patient understands and consents to
the procedure.

The skin entry site was localized using CT guidance.

The scan overlying the left hip was prepped with betadine in a
sterile fashion, and a sterile drape was applied covering the
operative field.  A sterile gown and sterile gloves were used for
the procedure. Local anesthesia was provided with 1% Lidocaine.
Ultrasound image documentation was performed.  A 22 gauge needle
was then advanced using intermittent CT for guidance into the left
iliopsoas muscle at the musculotendinous junction.  After placement
of the tip was confirmed by CT, 40 mg of Depo-Medrol and 2.5 ml of
1% lidocaine was injected.

Complications: None
IMPRESSION: Technically successful CT-guided injection of the left iliopsoas
tendon sheath.  40 mg Depo-Medrol and 2.5 ml 1% lidocaine was
injected.

## 2010-07-21 ENCOUNTER — Other Ambulatory Visit: Payer: Self-pay | Admitting: Internal Medicine

## 2010-07-21 ENCOUNTER — Encounter: Payer: Self-pay | Admitting: Internal Medicine

## 2010-07-21 ENCOUNTER — Ambulatory Visit (INDEPENDENT_AMBULATORY_CARE_PROVIDER_SITE_OTHER): Payer: Medicare Other | Admitting: Internal Medicine

## 2010-07-21 DIAGNOSIS — G238 Other specified degenerative diseases of basal ganglia: Secondary | ICD-10-CM

## 2010-07-21 DIAGNOSIS — E0590 Thyrotoxicosis, unspecified without thyrotoxic crisis or storm: Secondary | ICD-10-CM

## 2010-07-21 DIAGNOSIS — E039 Hypothyroidism, unspecified: Secondary | ICD-10-CM

## 2010-07-21 DIAGNOSIS — I10 Essential (primary) hypertension: Secondary | ICD-10-CM

## 2010-07-21 DIAGNOSIS — E785 Hyperlipidemia, unspecified: Secondary | ICD-10-CM

## 2010-07-21 LAB — BASIC METABOLIC PANEL
BUN: 16 mg/dL (ref 6–23)
CO2: 33 mEq/L — ABNORMAL HIGH (ref 19–32)
Calcium: 9.3 mg/dL (ref 8.4–10.5)
Creatinine, Ser: 0.8 mg/dL (ref 0.4–1.2)
GFR: 77.49 mL/min (ref >60.00)
Glucose, Bld: 86 mg/dL (ref 70–99)

## 2010-07-21 LAB — CBC WITH DIFFERENTIAL/PLATELET
Basophils Absolute: 0 10*3/uL (ref 0.0–0.1)
Eosinophils Absolute: 0.1 10*3/uL (ref 0.0–0.7)
Lymphocytes Relative: 24.6 % (ref 12.0–46.0)
MCHC: 33.8 g/dL (ref 30.0–36.0)
MCV: 82.5 fl (ref 78.0–100.0)
Monocytes Absolute: 0.7 10*3/uL (ref 0.1–1.0)
Neutrophils Relative %: 66.2 % (ref 43.0–77.0)
RDW: 14.9 % — ABNORMAL HIGH (ref 11.5–14.6)

## 2010-07-23 ENCOUNTER — Telehealth: Payer: Self-pay | Admitting: Internal Medicine

## 2010-07-23 NOTE — Letter (Signed)
Summary: [facility]  [facility]   Imported By: Maryln Gottron 07/10/2010 12:58:14  _____________________________________________________________________  External Attachment:    Type:   Image     Comment:   External Document

## 2010-07-28 NOTE — Progress Notes (Signed)
Summary: wheelchair  Phone Note From Other Clinic   Caller: [facility]  Summary of Call: Renee Rival from [facility] needs it documented that pt can push herself and why she needs Lite weight wheelchair? Please  advise. Army Fossa CMA  July 23, 2010 3:44 PM   Follow-up for Phone Call        send my last OV note, all the info he needs is there Follow-up by: [facility] E. Florabel Faulks MD,  July 23, 2010 6:44 PM  Additional Follow-up for Phone Call Additional follow up Details #1::        faxed OV. Army Fossa CMA  July 24, 2010 7:57 AM

## 2010-07-28 NOTE — Assessment & Plan Note (Signed)
Summary: 3 month followup/sph   Vital Signs:  Patient profile:   65 year old female Height:      65 inches Weight:      233 pounds BMI:     38.91 Pulse rate:   68 / minute Pulse rhythm:   regular BP sitting:   134 / 82  (left arm) Cuff size:   large  Vitals Entered By: Army Fossa CMA (July 21, 2010 2:07 PM) CC: 3 month f/u Comments needs a light weight wheel chair, needs to know she can push it herseft. [facility] [address]   History of Present Illness:  routine office visit , here with her husband She has  power wheelchair that she uses inside her  house , it is very bulky  on very hard to maneuver within her  house. She needs something lighter like a  self propel light wheelchair   review of systems  she was seen few  months ago with difficulty swallowing, a  swallow study was done, she was recommended thin liquids. Doing well now. She  keeps her spirits are okay despite PSP;  overall, she feels weaker, needs help taking her showers and transferring.   her husband provides most of the help    her lower extremities are weaker than the upper extremities  Current Medications (verified): 1)  Levothyroxine Sodium 125 Mcg Tabs (Levothyroxine Sodium) .Marland Kitchen.. 1 By Mouth Once Daily 2)  Furosemide 20 Mg  Tabs (Furosemide) .... One Tablet A Day 3)  Protonix 40 Mg Tbec (Pantoprazole Sodium) .... Take One Tablet Two Times A Day 4)  Calcium Plus Vit-D .... 500mg  2 Once Daily 5)  Hyzaar 100-25 Mg Tabs (Losartan Potassium-Hctz) .... One By Mouth Daily 6)  Flonase 50 Mcg/act Susp (Fluticasone Propionate) .... 2 Sprays Once Daily 7)  Mvi 8)  Amitriptyline Hcl 25 Mg Tabs (Amitriptyline Hcl) .Marland Kitchen.. 1 A Day 9)  Klor-Con M20 20 Meq Cr-Tabs (Potassium Chloride Crys Cr) .... 2 By Mouth Two Times A Day. 10)  Beside Toliet .... Please Allow Pt To Get 1. 11)  Vicodin 5-500 Mg Tabs (Hydrocodone-Acetaminophen) .Marland Kitchen.. 1 By Mouth Every 4 Hours As Needed For Pain  Allergies (verified): No Known Drug  Allergies  Past History:  Past Medical History: Reviewed history from 03/21/2009 and no changes required. Parkinson, Dx 12-09--neuro Dr Anne Hahn , Dx change to Progressive Supranuclaer Palsy in 2010 Depression GERD osteoporosis Hyperlipidemia Hypothyroidism Hypertension BACK PAIN, CHRONIC   HOARSENESS, CHRONIC   COLONIC POLYPS, HX OF   POSTMENOPAUSAL STATUS  h/o SOB  s/p a negative Cardio-pulmonary stress test 08-27-04, normal PFT s 2006 Breast cancer, hx of 1996 2006  Melanoma in situ nose, s/p Mohs surgery Allergic rhinitis  Past Surgical History: Reviewed history from 02/29/2008 and no changes required. Mastectomy Hysterectomy  and  oophorectomy 1990 STRESS TEST (DECEMBER 2002) FRACTURE, ARM,  Spinal fusion 10-24-07, 6 screws Cataract extraction --R--  Physical Exam  General:  alert, well-developed, and overweight-appearing.   Lungs:  normal respiratory effort, no intercostal retractions, no accessory muscle use, and normal breath sounds.   Heart:  normal rate, regular rhythm, and no murmur.   Extremities:   trace bilateral pretibial edema Neurologic:   face is symmetric, moderate masking.  speech is whispery and dysarthric  her upper extremities are stronger than the lower extremities.  Coordination is poor   she is wheelchair-bound   Impression & Recommendations:  Problem # 1:  PROGRESSIVE SUPRANUCLEAR PALSY (ICD-333.0) disease seems to be progressing. Given  her physical findings, she will definitely benefit from a self propelled light wheelchair.  I asked the patient to contact me with the appropriate person to get that for her.  Problem # 2:  HYPERTHYROIDISM (ICD-242.90) labs  Orders: Venipuncture (16109) TLB-TSH (Thyroid Stimulating Hormone) (84443-TSH)  Labs Reviewed: TSH: 1.12 (12/18/2009)     Problem # 3:  HYPERTENSION (ICD-401.9) labs  Her updated medication list for this problem includes:    Furosemide 20 Mg Tabs (Furosemide) ..... One tablet a  day    Hyzaar 100-25 Mg Tabs (Losartan potassium-hctz) ..... One by mouth daily    Hydrochlorothiazide 12.5 Mg Caps (Hydrochlorothiazide) .Marland Kitchen... 1 by mouth daily.  Orders: Specimen Handling (60454) TLB-BMP (Basic Metabolic Panel-BMET) (80048-METABOL) TLB-CBC Platelet - w/Differential (85025-CBCD)  BP today: 134/82 Prior BP: 128/86 (04/21/2010)  Labs Reviewed: K+: 3.6 (02/19/2010) Creat: : 0.8 (02/19/2010)   Chol: 233 (03/09/2007)   HDL: 33.9 (03/09/2007)   LDL: DEL (03/09/2007)   TG: 391 (03/09/2007)  Problem # 4:  HYPERLIPIDEMIA (ICD-272.4)  on no medications, I discussed with the patient if she would like her chol  to be checked. she declined    Problem # 5:  OSTEOPOROSIS (ICD-733.00)  history of  osteoporosis, we agreed not to pursue further workup or elevation  Problem # 6:  DEPRESSION (ICD-311)  fortunately, she is doing okay Her updated medication list for this problem includes:    Amitriptyline Hcl 25 Mg Tabs (Amitriptyline hcl) .Marland Kitchen... 1 a day  Problem # 7:  F2F > 25 min, > 50% of time counseling and reviewing notes from neurology  Complete Medication List: 1)  Levothyroxine Sodium 125 Mcg Tabs (Levothyroxine sodium) .Marland Kitchen.. 1 by mouth once daily 2)  Furosemide 20 Mg Tabs (Furosemide) .... One tablet a day 3)  Protonix 40 Mg Tbec (Pantoprazole sodium) .... Take one tablet two times a day 4)  Calcium Plus Vit-d  .... 500mg  2 once daily 5)  Hyzaar 100-25 Mg Tabs (Losartan potassium-hctz) .... One by mouth daily 6)  Flonase 50 Mcg/act Susp (Fluticasone propionate) .... 2 sprays once daily 7)  Mvi  8)  Amitriptyline Hcl 25 Mg Tabs (Amitriptyline hcl) .Marland Kitchen.. 1 a day 9)  Klor-con M20 20 Meq Cr-tabs (Potassium chloride crys cr) .... 2 by mouth two times a day. 10)  Beside Toliet  .... Please allow pt to get 1. 11)  Vicodin 5-500 Mg Tabs (Hydrocodone-acetaminophen) .Marland Kitchen.. 1 by mouth every 4 hours as needed for pain 12)  Self Propelled Light Wheelchair  .... Dx psp call for medical  records if needed 13)  Hydrochlorothiazide 12.5 Mg Caps (Hydrochlorothiazide) .Marland Kitchen.. 1 by mouth daily.  Patient Instructions: 1)  Please schedule a follow-up appointment in 4 months .  Prescriptions: HYDROCHLOROTHIAZIDE 12.5 MG CAPS (HYDROCHLOROTHIAZIDE) 1 by mouth daily.  #30 x 2   Entered by:   Army Fossa CMA   Authorized by:   Nolon Rod. Paz MD   Signed by:   Army Fossa CMA on 07/22/2010   Method used:   Electronically to        [id number] (retail)       [address]       [place], [place]  [address]       Ph: [phone]       Fax: [phone]   RxID:   [id number] SELF PROPELLED LIGHT WHEELCHAIR Dx PSP call for medical records if needed  #1 x 0  Entered and Authorized by:   Nolon Rod. Paz MD   Signed by:   Nolon Rod. Paz MD on 07/21/2010   Method used:   Print then Give to Patient   RxID:   [id number]    Orders Added: 1)  Venipuncture [57846] 2)  Specimen Handling [99000] 3)  TLB-BMP (Basic Metabolic Panel-BMET) [80048-METABOL] 4)  TLB-TSH (Thyroid Stimulating Hormone) [84443-TSH] 5)  TLB-CBC Platelet - w/Differential [85025-CBCD] 6)  Est. Patient Level IV [96295]

## 2010-08-17 ENCOUNTER — Other Ambulatory Visit (INDEPENDENT_AMBULATORY_CARE_PROVIDER_SITE_OTHER): Payer: Medicare Other

## 2010-08-17 DIAGNOSIS — I10 Essential (primary) hypertension: Secondary | ICD-10-CM

## 2010-08-17 DIAGNOSIS — D649 Anemia, unspecified: Secondary | ICD-10-CM

## 2010-08-17 LAB — BASIC METABOLIC PANEL
BUN: 15 mg/dL (ref 6–23)
Chloride: 94 mEq/L — ABNORMAL LOW (ref 96–112)
Creatinine, Ser: 0.6 mg/dL (ref 0.4–1.2)
GFR: 112.91 mL/min (ref >60.00)
Potassium: 3.2 mEq/L — ABNORMAL LOW (ref 3.5–5.1)

## 2010-08-17 LAB — HEMOGLOBIN: Hemoglobin: 10.9 g/dL — ABNORMAL LOW (ref 12.0–15.0)

## 2010-08-17 LAB — FERRITIN: Ferritin: 26.5 ng/mL (ref 10.0–291.0)

## 2010-08-18 ENCOUNTER — Other Ambulatory Visit: Payer: Self-pay | Admitting: Internal Medicine

## 2010-08-18 ENCOUNTER — Other Ambulatory Visit: Payer: Self-pay | Admitting: *Deleted

## 2010-08-18 MED ORDER — LEVOTHYROXINE SODIUM 125 MCG PO CAPS: ORAL_CAPSULE | ORAL | Status: DC

## 2010-08-19 ENCOUNTER — Other Ambulatory Visit: Payer: Self-pay | Admitting: Internal Medicine

## 2010-08-20 ENCOUNTER — Telehealth (INDEPENDENT_AMBULATORY_CARE_PROVIDER_SITE_OTHER): Payer: Medicare Other | Admitting: Internal Medicine

## 2010-08-20 DIAGNOSIS — I10 Essential (primary) hypertension: Secondary | ICD-10-CM

## 2010-08-20 MED ORDER — LOSARTAN POTASSIUM 100 MG PO TABS: 100.0000 mg | ORAL_TABLET | Freq: Every day | ORAL | Status: DC

## 2010-08-20 MED ORDER — SPIRONOLACTONE 25 MG PO TABS: 25.0000 mg | ORAL_TABLET | Freq: Every day | ORAL | Status: DC

## 2010-08-20 NOTE — Telephone Encounter (Signed)
Advise patient,potassium continued to be low: d/c HTCZ Change losartan-HCT to---> plain losartan Disregard Rx for aldactone Anemia stable. Iron pending Check a BMP in 2 weeks please arrange, and next office visit by 11-2010

## 2010-08-20 NOTE — Telephone Encounter (Signed)
Spoke w/ pt aware of information.  

## 2010-08-24 ENCOUNTER — Other Ambulatory Visit: Payer: PRIVATE HEALTH INSURANCE

## 2010-09-03 ENCOUNTER — Other Ambulatory Visit (INDEPENDENT_AMBULATORY_CARE_PROVIDER_SITE_OTHER): Payer: Medicare Other

## 2010-09-03 DIAGNOSIS — E876 Hypokalemia: Secondary | ICD-10-CM

## 2010-09-03 LAB — BASIC METABOLIC PANEL
BUN: 14 mg/dL (ref 6–23)
CO2: 28 mEq/L (ref 19–32)
Chloride: 103 mEq/L (ref 96–112)
Creatinine, Ser: 0.8 mg/dL (ref 0.4–1.2)

## 2010-09-15 ENCOUNTER — Other Ambulatory Visit: Payer: Self-pay | Admitting: Internal Medicine

## 2010-09-23 ENCOUNTER — Other Ambulatory Visit: Payer: Self-pay | Admitting: Internal Medicine

## 2010-09-24 NOTE — Telephone Encounter (Signed)
90 and 1 RF 

## 2010-10-02 NOTE — Op Note (Signed)
NAME:  Emily Mccullough, Emily Mccullough NO.:  [id number]   MEDICAL RECORD NO.:  [id number]                   PATIENT TYPE:  INP   LOCATION:  3011                                 FACILITY:  [facility]   PHYSICIAN:  Cristi Loron, M.D.            DATE OF BIRTH:  Aug 29, 1945   DATE OF PROCEDURE:  01/22/2004  DATE OF DISCHARGE:  01/23/2004                                 OPERATIVE REPORT   PREOPERATIVE DIAGNOSES:  Left L4-5 and L5-S1 spinal stenosis, bulging disk,  lumbar radiculopathy, and lumbago.   POSTOPERATIVE DIAGNOSIS:  Left L4-5 and L5-S1 spinal stenosis, bulging disk,  lumbar radiculopathy, and lumbago.   PROCEDURE:  Left L5 hemilaminectomy, foraminotomy, and left L4 laminotomy,  foraminotomy, using microdissection.   SURGEON:  Cristi Loron, M.D.   ASSISTANT:  Clydene Fake, M.D.   ANESTHESIA:  General endotracheal.   ESTIMATED BLOOD LOSS:  50 mL.   SPECIMENS:  None.   DRAINS:  None.   COMPLICATIONS:  None.   BRIEF HISTORY:  The patient is a 65 year old white female who has suffered  from back and left leg pain.  She failed medical management and was worked  up with a lumbar MRI, which demonstrated some spinal stenosis.  We then  further worked her up with a lumbar myelo-CT, which demonstrated there was  significant lateral recess stenosis at L4-5 and L5-S1.  I discussed the  various treatment options, including surgery, with the patient.  She has  weighed the risks, benefits, and alternatives of surgery and decided to  proceed with an L4 and L5 laminectomy to treat her stenosis.   DESCRIPTION OF PROCEDURE:  The patient was brought to the operating room by  the anesthesia team.  General endotracheal anesthesia was induced.  The  patient was then turned to the prone position on the Wilson frame.  Her  lumbosacral region was then prepared with Betadine scrub and Betadine  solution and sterile drapes were applied.  I then injected the area to  be  incised with Marcaine with epinephrine solution and used a scalpel to make a  linear midline incision over the L4-5 and L5-S1 interspaces.  I used  electrocautery to dissect down to the thoracolumbar fascia, divided the  fascia just to the left of midline, performing a left-sided subperiosteal  dissection, stripping the paraspinous musculature from the left L4, L5, and  S1 spinous process and lamina.  I inserted the McCullough retractor for  exposure and obtained the intraoperative radiograph to confirm our location.  We then brought the operating microscope into the field and under its  magnification and illumination completed the microdissection/decompression.   We used a high-speed drill to perform a left L5 and L5 laminotomy.  We  performed a left L5 hemilaminectomy using a Kerrison punch, and we removed  the L5-S1 and L4-5 ligamentum flavum.  We widened the L4 laminotomy using  Kerrison punch.  We then  used microdissection to free up the thecal sac and  the left L5 and S1 nerve roots from the __________ tissue.  Dr. Phoebe Perch  gently retracted the neural structures medially and performed a foraminotomy  about the left L4 and L5 nerve root, removed some excess ligamentum flavum  from the bilateral recesses.  We then inspected the intervertebral disks at  L4-5 and L5-S1.  They were mildly bulging, but there was no significant  neural compression.  We therefore did not enter into the intervertebral disk  space.  We then palpated along the exit route of the left L5 and S1 nerve  root, noting that they were well-decompressed.  We then obtained stringent  hemostasis using bipolar electrocautery, copiously irrigated the wound out  with bacitracin solution.  We removed the McCullough retractor and then  reapproximated the patient's thoracolumbar fascia with interrupted #1 Vicryl  suture, the subcutaneous tissue with interrupted 2-0 Vicryl suture, and the  skin with Steri-Strips and Benzoin.   The wound was then covered with  bacitracin ointment and sterile dressings applied.  The drapes were removed.  The patient was subsequently returned to a supine position, where she was  extubated by the anesthesia team and transported to the postanesthesia care  unit in stable condition.  All sponge, instrument, and needle counts were  correct at the end of this case.                                               Cristi Loron, M.D.    JDJ/MEDQ  D:  01/24/2004  T:  01/24/2004  Job:  [id number]

## 2010-10-02 NOTE — H&P (Signed)
[facility]  Patient:    Emily Mccullough, Emily Mccullough Visit Number: [id number] MRN: [id number]          Service Type: MED Location: 1800 [id number] [id number] Attending Physician:  Lorre Nick Dictated by:   Arturo Morton. Riley Kill, M.D. [facility] Proc. Date: 05/03/01 Admit Date:  05/02/2001                           History and Physical  CHIEF COMPLAINT:  Sharp chest pain.  HISTORY OF PRESENT ILLNESS:  The patient is a 65 year old white female who sees Dr. Trudee Kuster.  Last night she felt poorly and this morning, she through up at both 7 oclock and 8 oclock.  She stayed home all day, felt weak and did not really feel very good.  She did not eat all day and took a pain pill for her back.  She then had sharp chest pain in the chest for two episodes; the first lasted 30 to 45 minutes; the second episode, her husband said that she was somewhat hard to arouse and had a bright red appearance and sweating.  She never had complete syncope but the husband said she did not look great.  She is now stable.  PAST MEDICAL HISTORY: 1. Hysterectomy in 1990. 2. Mastectomy in 1996 for breast cancer with negative nodes. 3. Vulvar resection, 2002. 4. Recent blood pressure elevation. 5. History of hypothyroidism. 6. History of recent tooth abscess.  FAMILY HISTORY:  Father died at 71 and has emphysema and heart trouble. Mother died from some muscle disease with arrhythmias and had type 2 diabetes. She has two brothers who are okay.  SOCIAL HISTORY:  The patient has no children.  She is a smoker.  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  REVIEW OF SYSTEMS:  HEENT is negative.  There are no lung problems or GI problems.  She has skin cancer on the nose that has been removed, otherwise, negative review of systems.  PHYSICAL EXAMINATION:  GENERAL:  Alert, oriented white female in no acute distress.  NECK:  Supple.  There is no JVD.  The  carotid upstroke is brisk without bruit.  HEENT:  Unremarkable.  VITAL SIGNS:  The blood pressure is 120/70, pulse 74, respirations 18 and temperature 98.8.  LUNGS:  Lung fields are clear.  CARDIAC:  The cardiac rhythm is regular with soft S4 gallop.  ABDOMEN:  Soft without hepatosplenomegaly.  EXTREMITIES:  Distal pulses are intact.  NEUROLOGIC:  Exam is nonfocal.  X-RAY FINDINGS:  Chest x-ray read on folder as right basilar atelectasis.  The x-ray is a portable.  ECG:  Normal sinus rhythm.  No acute ECG abnormalities noted, without ST-T wave abnormalities.  P-R interval is normal.  The Q-T is prolonged at approximately 600 msec.  LABORATORY DATA:  The hemoglobin is 14.4, hematocrit 40.8, platelet count 280,000.  Potassium is 2.6 mg/dl, BUN 18, glucose 478.  D-dimer pending.  IMPRESSION: 1. Chest pain, which is somewhat atypical for ischemia.  Patient is on    megestrol.  Rule out pulmonary embolus. 2. Presyncope, unknown etiology. 3. Progestational agent for menstrual symptoms. 4. Long Q-T, with prior normal Q-T interval, possibly secondary to hypokalemia    and Effexor. 5. Prior history of breast cancer with negative nodes. 6. History of vulvar cancer. 7. Recent tooth abscess.  PLAN: 1. Admit. 2. Two-dimensional echocardiography to assess LV function. 3. Check D-dimer;  if negative, no further workup; if positive, consider for CT    scan to rule out PE. 4. Replace potassium. 5. If above workup unremarkable, would consider stress Cardiolite.   6. Decrease Effexor dose. 7. Have Dr. Jearl Klinefelter see the patient in the a.m. Dictated by:   Arturo Morton Riley Kill, M.D. [facility] Attending Physician:  Lorre Nick DD:  05/03/01 TD:  05/03/01 Job: [id number]

## 2010-10-02 NOTE — Op Note (Signed)
NAMEDIVINA, NEALE              ACCOUNT NO.:  [id number]   MEDICAL RECORD NO.:  [id number]          PATIENT TYPE:  OIB   LOCATION:  5011                         FACILITY:  [facility]   PHYSICIAN:  Cristi Loron, M.D.DATE OF BIRTH:  20-Dec-1945   DATE OF PROCEDURE:  05/03/2005  DATE OF DISCHARGE:                                 OPERATIVE REPORT   BRIEF HISTORY:  The patient is a 65 year old white female whom I performed a  prior left L4-5 laminotomy, foraminotomy for stenosis a couple of years ago.  She did very well but more recently has developed severe back, left buttock,  and left leg pain consistent with a left S1 radiculopathy.  She has failed  medical management and was worked up with a lumbar MRI which demonstrated a  left L5-S1 herniated nucleus pulposus.  I discussed the treatment options  with the patient including surgery.  The patient has weighed the rights  benefits and alternatives of surgery and has decided to proceed with a left  L5-S1 redo microdiskectomy.   PREOPERATIVE DIAGNOSIS:  Left L5-S1 recurrent herniated nucleus pulposus,  stenosis, lumbar ventriculoplasty and lumbago.   POSTOPERATIVE DIAGNOSIS:  Left L5-S1 recurrent herniated nucleus pulposus,  stenosis, lumbar ventriculoplasty and lumbago.   PROCEDURE:  Left L5-S1 redo microdiskectomy using microdissection.   SURGEON:  Cristi Loron, M.D.   ASSISTANT:  None.   ANESTHESIA:  General endotracheal.   ESTIMATED BLOOD LOSS:  50 mL   SPECIMENS:  None.   DRAINS:  None.   COMPLICATIONS:  None.   DESCRIPTION OF PROCEDURE:  The patient was brought to the operating room by  the anesthesia team.  General endotracheal anesthesia was induced.  The  patient was then turned into the prone position on the Wilson frame.  Her  lumbosacral region was then prepared with Betadine scrub and Betadine  solution and sterile drapes were applied.  I then injected air into the  midsides with Marcaine with  epinephrine solution.  Using a scalpel I made a  linear midline incision through the patient's previous surgical scar.  I  used electrocautery to perform a left-sided subperiosteal dissection  exposing the spinous process and lamina of L4 and L5 in the upper sacrum.   I then obtained an intraoperative radiograph to confirm our location; and  then inserted the [facility] retractor for exposure.  We then brought the  operating microscope into the field; and under some magnification and  illumination completed the microdissection/decompression.  I used a high-  speed drill to drill up the cephalad edge of the S1 lamina in order to gain  access to relatively nonscarred dura.  I then carefully used microdissection  to dissect through the epidural fibrosis and identify the L5-S1 nerve root.  We then followed this proximally in a cephalad direction and used  microdissection to free up the epidural fibrosis from the nerve root and the  thecal sac.  We using a Kerrison punch to widen the previous left L5  laminotomy.   I obtained an intraoperative radiograph to confirm our location.  I then  freed up the thecal  sac and the left S1 nerve root from the epidural  fibrosis and then gently retracted the thecal sac and nerve root medially  with the  retractor.  This exposed a large herniated nucleus pulposus at L5-  S1 at the disk space on the left.  I incised into the disk herniation using  the 15-blade scalpel and performed a partial intervertebral diskectomy using  the pituitary forceps and the Epstein curettes.  After I was satisfied with  the intervertebral discectomy, I then used the osteophyte tool to remove  some redundant posterior longitudinal ligament from the vertebral endplates,  thereby, further decompressing the S1 nerve root.   I then palpated along the ventral surface of the left S1 nerve root and the  thecal sac and noted that the neural structure was well decompressed and the  S1 nerve  root was decompressed all the way out into the neural foramen.  I  then obtained hemostasis using bipolar electrocautery, irrigated the wound  out with bacitracin solution, removed the solution, then removed the  retractor.  I then reapproximated the patient's thoracolumbar fascia with  interrupted #1 Vicryl suture; the subcutaneous tissue with interrupted 2-0  Vicryl suture; and the skin with Steri-Strips and Benzoin.  The wound was  then covered with bacitracin ointment, sterile dressings applied, and the  drapes were removed.  The patient was subsequently returned to the supine  position where she was extubated by the anesthesia team and transported to  the postanesthesia care unit in stable condition.  All sponge, instrument,  and needle counts were correct at the end of the case.      Cristi Loron, M.D.  Electronically Signed     JDJ/MEDQ  D:  05/03/2005  T:  05/05/2005  Job:  [id number]

## 2010-10-02 NOTE — Assessment & Plan Note (Signed)
[facility]                         GASTROENTEROLOGY OFFICE NOTE   NAME:Emily Mccullough, Emily Mccullough                     MRN:          [id number]  DATE:09/21/2006                            DOB:          02-Feb-1946    REASON FOR CONSULTATION:  Anemia, sounds like iron deficiency anemia.  Family history of colon cancer, and apparent history of colon polyps.   ASSESSMENT:  A 65 year old white woman who was recently found to be  anemic by Dr. Hilbert Bible.  She was placed on iron, and her hemoglobin is  now normal, she says.  She has a history of colon polyps, she tells me.  Her last colonoscopy was August 06, 2003, which showed diverticulosis of  the cecum and sigmoid colon (Dr. Virginia Rochester).  It also showed some thickening  of the sigmoid colon, some deep folds that were hard to examined, and  Dr. Virginia Rochester had recommended a repeat routine exam in 3 years.  She had an  unremarkable upper GI endoscopy on August 06, 2003 by Dr. Virginia Rochester because of  reflux.   RECOMMENDATIONS AND PLAN:  1. Colonoscopy to investigate the anemia, and to reassess her colon.      She has a family history of colon cancer in an elderly father.  If      this examination were negative, I think I would wait at least 5      years before performing a routine colonoscopy.  2. If this is negative, and given her iron deficiency anemia, may need      to evaluate further for causes of iron deficiency anemia with a      repeat upper endoscopy or capsule endoscopy.  She is obese.  I      think sprue is extremely unlikely.  3. She also has bleeding hemorrhoids.  These could be a cause for      anemia, but it does not sound like they are frequent enough to      cause that.  4. We have requested the records of the recent anemia from Dr. Hilbert Bible.   Risks, benefits, and indications of the procedure have been explained to  the patient.  She understands and agrees to proceed.   HISTORY:  As above.  See my medical  history form for full details.  Reflux is controlled on Nexium.   MEDICATIONS:  1. Opana (morphine) 15 mg every 12 hours.  2. Iron 325 mg b.i.d.  3. Levothyroxine 125 mcg Sunday, Tuesday, Thursday, Saturday.      Levothyroxine 125 mcg 1/2 Monday, Wednesday, Friday.  4. Furosemide 20 mg daily.  5. Potassium 10 mEq 3 times a day.  6. Nexium 40 mg daily.  7. Multivitamin daily.  8. Calcium daily.  9. Diovan hydrochlorothiazide 160/25 daily.  10.Ibuprofen p.r.n.  11.Excedrin p.r.n.   THERE ARE NO KNOWN DRUG ALLERGIES.   PAST MEDICAL HISTORY:  1. Apparent history of colon polyps.  2. Diverticulosis.  3. Gastroesophageal reflux disease.  4. Hypertension.  5. Obesity.  6. Hypothyroidism.  7. History of breast cancer.  8. History of melanoma.  9. Status post hysterectomy.  10.Status post mastectomy and TRAM flap reconstruction 1996.  11.Lumbar disk surgery December 2006 with chronic pain in the left hip      and left leg subsequently.  She has had, actually, a redo L5/S1      microdiskectomy in 2006.  The first procedure was in September      2005.  12.Obesity.   FAMILY HISTORY:  Positive for heart disease and the colon cancer, as  mentioned above.   She is retired from [employer], working in Insurance account manager of retirement  benefits.  No alcohol, tobacco, or drugs.   REVIEW OF SYSTEMS:  See my medical history form.  She has a lot of  fatigue.   PHYSICAL EXAMINATION:  Height 5 feet 6 inches.  Weight 211 pounds.  Blood pressure 122/76.  Pulse 64.  This is an obese middle-aged woman, in no acute distress.  Well  developed, well nourished.  Eyes anicteric.  ENT:  Normal oropharynx.  NECK:  Supple without thyromegaly.  CHEST:  Clear.  HEART:  S1 and S2.  No murmurs or gallops.  ABDOMEN:  Soft, obese, non-tender.  There are scars from her TRAM flap.  There is no organomegaly or masses detected.  RECTAL:  Exam is deferred.  LOWER EXTREMITIES:  Show trace peripheral edema.   PSYCHIATRIC:  She is alert and oriented x3.   I appreciate the opportunity to care for this patient.  I have reviewed  records that are in E-chart, including her previous operative note, Dr.  Wende Neighbors previous colonoscopy report.     Iva Boop, MD,FACG  Electronically Signed    CEG/MedQ  DD: 09/21/2006  DT: 09/21/2006  Job #: [id number]   cc:   Willow Ora, MD  Malachi Pro. Ambrose Mantle, M.D.

## 2010-10-02 NOTE — Op Note (Signed)
NAME:  Emily Mccullough, Emily Mccullough NO.:  [id number]   MEDICAL RECORD NO.:  [id number]                   PATIENT TYPE:  AMB   LOCATION:  ENDO                                 FACILITY:  [facility]   PHYSICIAN:  Georgiana Spinner, M.D.                 DATE OF BIRTH:  [birth year]   DATE OF PROCEDURE:  08/06/2003  DATE OF DISCHARGE:                                 OPERATIVE REPORT   PROCEDURE:  Upper endoscopy.   INDICATIONS FOR PROCEDURE:  GERD.   ANESTHESIA:  Demerol 75, Versed 5 mg.   PROCEDURE:  With the patient mildly sedated in the left lateral decubitus  position, the Olympus videoscopic endoscope was inserted in the mouth and  passed under direct vision through the esophagus which appeared normal.  There was no evidence of Barrett's.  We entered into the stomach.  The  fundus, body, antrum, duodenal bulb, and second portion of the duodenum were  visualized.  From this point, the endoscope was slowly withdrawn taking  circumferential views of the duodenal mucosa until the endoscope was pulled  back into the stomach and placed in retroflexion to view the stomach from  below.  The endoscope was then straightened and withdrawn taking  circumferential views of the remaining gastric and esophageal mucosa.  The  patient's vital signs and pulse oximeter remained stable.  The patient  tolerated the procedure well without apparent complications.   FINDINGS:  Unremarkable endoscopic examination.   PLAN:  Proceed to colonoscopy.                                               Georgiana Spinner, M.D.    GMO/MEDQ  D:  08/06/2003  T:  08/06/2003  Job:  [id number]   cc:   Wanda Plump, MD [facility]  [phone] [address] [place], [place] [address]

## 2010-10-02 NOTE — Op Note (Signed)
[facility]  Patient:    Emily Mccullough, Emily Mccullough                     MRN: [id number] Proc. Date: 12/09/00 Adm. 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  OPERATION:                      Wide local excision.  SURGEON:                        Malachi Pro. Ambrose Mantle, M.D.  ANESTHESIA:                     General.  DESCRIPTION OF PROCEDURE:       The patient was brought to the operating room and the patient was placed under satisfactory general anesthesia and placed in the lithotomy position.  The area in question was prepped with 5% acetic acid in the office.  The entire vulva had been visualized with the colposcope and there were no significant lesions.  This lesion was then examined with the microscope after prepping with 5% acetic acid.  I could see the extent of the lesion.  There were small dots of white epithelium surrounding some hair follicles closer down to the anus but these were not treated.  The lesion itself was inspected.  I tried to get a good margin with a marking pencil probably at least 1 cm all the way around.  I then incised the skin with the knife along the marking pencil border and removed the entire lesion.  I then tried to reestablish hemostasis as good as I could with the Bovie.  Then I reapproximated the skin with some running and some interrupted figure-of-eight sutures of 3-0 Vicryl.  There was no bleeding at the end of the procedure. The patient tolerated the procedure well and returned to recovery in satisfactory condition.  Blood loss was estimated at about 30 cc. DD:  12/09/00 TD:  12/09/00 Job: [id number]

## 2010-10-02 NOTE — Discharge Summary (Signed)
[facility]  Patient:    Emily Mccullough, Emily Mccullough Visit Number: [id number] MRN: [id number]          Service Type: MED Location: [id number] Attending Physician:  Ronaldo Miyamoto Dictated by:   Rozell Searing, P.A. Admit Date:  05/02/2001 Discharge Date: 05/05/2001   CC:         Trudee Kuster, M.D.   Referring Physician Discharge [place]  PROCEDURES:  Exercise Cardiolite, May 05, 2001.  REASON FOR ADMISSION:  The patient is a 65 year old female with no prior history of heart disease who presented to the emergency room with sharp chest pain in the setting of nausea and vomiting and general malaise.  She was also found to be difficult to arouse by her husband, who noted flushing and diaphoresis.  It was not clear if she had completely passed out.  Please refer to Dr. Louretta Shorten dictated admission note for full details.  LABORATORY DATA:  Potassium 2.6 on admission - 4.2 at discharge.  Normal WBC, hemoglobin, and platelets, with mildly elevated neutrophils 82%.  Renal function remained normal.  Cardiac enzymes:  CPK/MB and troponin I negative x 2.  Lipid profile:  Total cholesterol 179, triglycerides 116, HDL 38, LDL 118.  Elevated C reactive protein 8.4.  Decreased albumin 3.0, otherwise normal liver enzymes.  Admission CXR:  Mild right base atelectasis, otherwise negative.  HOSPITAL COURSE:  Patient presented in the context of the above-noted symptoms and was found to have significant QT prolongation on initial electrocardiogram (606 milliseconds) in the setting of hypokalemia (2.6).  A previous electrocardiogram in 1999 showed normal QT interval.  Diuretic was placed on hold and patient was placed on supplemental potassium during her brief stay.  Her potassium rose from 2.6 on admission to a final reading of 4.2 at discharge.  Serial cardiac enzymes were negative for myocardial infarction.  Serial electrocardiograms showed no acute changes  and normalization of QT interval, with a final reading of 401 milliseconds at discharge.  Workup also consisted of a d-dimer which was elevated (1.27).  Follow-up chest CT was negative for pulmonary embolus and lower extremity CT scan revealed no DVT.  Given the patients prolonged QT on presentation, her Effexor dose was cut in half.  The patient did also present with possible presyncope.  A 2-D echocardiogram revealed hyperdynamic LV function (75-85%) with no evidence of wall motion abnormality and no significant valvular lesions.  On day of discharge, patient was referred for exercise Cardiolite stress testing.  She exercised seven minutes and 22 seconds of standard Bruce protocol, achieving 9.1 METS.  No chest pain was elicited and no significant ST abnormalities noted.  Perfusion images were interpreted as normal; calculated ejection fraction 83%.  Final recommendation by Dr. Riley Kill at time of discharge included resumption of diuretic at one-half previous dose with the addition of supplemental potassium.  We will continue Effexor at one-half of home dose.  The patient has been instructed to have a follow-up BMP in one week.  She is also to refrain from driving until cleared by Dr. Riley Kill.  She will also be provided a list of medications to avoid, given her possible predisposition for prolonged QT interval.  DISCHARGE MEDICATIONS: 1. Synthroid 0.125 mg q.d. 2. Effexor 75 mg q.d. 3. Prevacid 30 mg q.d. 4. Fosamax 70 mg every week. 5. Megace 20 mg q.d. 6. Triamterene / HCTZ 25/37.5 mg q.d. 7. K-Dur 20 mEq q.d.  INSTRUCTIONS:  Patient is to refrain from driving until seen  by Dr. Riley Kill.  FOLLOW-UP:  She is instructed to have a follow-up BMP in one week at our clinic.  She is scheduled to follow up with Dian Queen, P.A.C. on May 18, 2001 at 11:15 a.m.  She will need to be seen by Dr. Riley Kill at time of follow-up.  DISCHARGE DIAGNOSES: 1. Nonischemic chest pain.     a. Negative serial cardiac enzymes.    b. Normal exercise Cardiolite, May 05, 2001. 2. Prolonged QT interval.    In the setting of marked hypokalemia. 3. Status post presyncope.    Normal left ventricular function. 4. Treated hypothyroidism. 5. Abscessed tooth. Dictated by:   Rozell Searing, P.A. Attending Physician:  Ronaldo Miyamoto DD:  05/05/01 TD:  05/05/01 Job: [id number]

## 2010-10-02 NOTE — Op Note (Signed)
NAMEERSEL, WADLEIGH              ACCOUNT NO.:  [id number]   MEDICAL RECORD NO.:  [id number]          PATIENT TYPE:  AMB   LOCATION:  ENDO                         [facility]ACILITY:  [facility]   PHYSICIAN:  Oley Balm. Sung Amabile, M.D. [facility] OF BIRTH:  Jul 28, 1945   DATE OF PROCEDURE:  09/04/2004  DATE OF DISCHARGE:  09/04/2004                                 OPERATIVE REPORT   PROCEDURE:  Cardiopulmonary stress test.   INDICATIONS:  Exertional dyspnea.   DESCRIPTION OF PROCEDURE:  Cardiopulmonary stress testing was performed on a  graded treadmill.  Testing was stopped due to dyspnea and fatigue.  Effort  was submaximal.  At peak exercise, oxygen uptake was 1.53 L per minute or  86% of predicted maximum, indicating low normal exercise tolerance.  When  corrected for body weight, she has mild impairment with an oxygen uptake of  16.6 mL/kg/minute (63% of predicted).   At peak exercise, heart rate was 125 beats per minute or 78% of predicted  maximum indicating that cardiovascular reserve remained.  Oxygen pulse was  normal suggesting normal left ventricular function.  Blood pressure response  was normal.  EKG tracings revealed a blunted heart rate response.  There was  no evidence of ischemia and no arrhythmias.   At peak exercise, minute ventilation was 62.4 L per minute or 78% of maximum  voluntary ventilation.  This indicates that ventilatory limitation was  approached.  Gas exchange parameters revealed no abnormalities.  Baseline  spirometry revealed minimal obstruction.  Post exercise spirometry revealed  no exercise-induced bronchospasm.   SUMMARY:  Low normal exercise tolerance.  Mild impairment when corrected for  body weight.  Impairment appears to be due to obesity, blunted heart rate  response and submaximal effort.  Mild obstruction noted on spirometry but no  evidence of exercise induced bronchospasm.      DBS/MEDQ  D:  09/10/2004  T:  09/10/2004  Job:  [id number]   cc:   Charlaine Dalton. Sherene Sires, M.D. [facility]

## 2010-10-02 NOTE — Op Note (Signed)
NAME:  Emily Mccullough, Emily Mccullough NO.:  [id number]   MEDICAL RECORD NO.:  [id number]                   PATIENT TYPE:  AMB   LOCATION:  ENDO                                 FACILITY:  [facility]   PHYSICIAN:  Georgiana Spinner, M.D.                 DATE OF BIRTH:  May 08, 1946   DATE OF PROCEDURE:  08/06/2003  DATE OF DISCHARGE:                                 OPERATIVE REPORT   PROCEDURE:  Colonoscopy.   INDICATIONS FOR PROCEDURE:  Family history of colon polyps.   ANESTHESIA:  Demerol 25, Versed 3 mg.   PROCEDURE:  With the patient mildly sedated in the left lateral decubitus  position, the Olympus videoscopic colonoscope was inserted in the rectum and  with pressure was passed under direct vision to the cecum identified by the  ileocecal valve and appendiceal orifice, both of which were photographed.  From this point, the colonoscope was slowly withdrawn taking circumferential  views of the entire colonic mucosa stopping to photograph diverticula seen  in the right colon until we reached the rectum which appeared normal on  direct and showed hemorrhoids on retroflex view.  The endoscope was  straightened and withdrawn.  The patient's vital signs and pulse oximeter  remained stable.  The patient tolerated the procedure well without apparent  complications.   FINDINGS:  Diverticulosis of the cecum and sigmoid colon, thickening of the  sigmoid colon, internal hemorrhoids.   PLAN:  Because of the thickening in the sigmoid colon, would repeat  examination probably in three years because of difficulty seeing behind the  folds.  Will have the patient follow up with me as needed.                                               Georgiana Spinner, M.D.    GMO/MEDQ  D:  08/06/2003  T:  08/06/2003  Job:  [id number]   cc:   Wanda Plump, MD [facility]  [phone] [address] [place], [place] [address]

## 2010-10-12 ENCOUNTER — Other Ambulatory Visit: Payer: Self-pay | Admitting: Internal Medicine

## 2010-11-06 ENCOUNTER — Other Ambulatory Visit: Payer: Self-pay | Admitting: Internal Medicine

## 2010-11-06 NOTE — Telephone Encounter (Signed)
Ok x 6 months 

## 2010-11-13 ENCOUNTER — Other Ambulatory Visit: Payer: Self-pay | Admitting: Internal Medicine

## 2010-11-19 ENCOUNTER — Encounter: Payer: Self-pay | Admitting: Internal Medicine

## 2010-11-20 ENCOUNTER — Ambulatory Visit (INDEPENDENT_AMBULATORY_CARE_PROVIDER_SITE_OTHER): Payer: Medicare Other | Admitting: Internal Medicine

## 2010-11-20 ENCOUNTER — Encounter: Payer: Self-pay | Admitting: Internal Medicine

## 2010-11-20 DIAGNOSIS — G238 Other specified degenerative diseases of basal ganglia: Secondary | ICD-10-CM

## 2010-11-20 DIAGNOSIS — J309 Allergic rhinitis, unspecified: Secondary | ICD-10-CM

## 2010-11-20 DIAGNOSIS — I10 Essential (primary) hypertension: Secondary | ICD-10-CM

## 2010-11-20 DIAGNOSIS — E039 Hypothyroidism, unspecified: Secondary | ICD-10-CM

## 2010-11-20 NOTE — Assessment & Plan Note (Addendum)
Neuro note from 6-12 reviewed, gradual deterioration. Noted a weak cough, rec incentive spirometry (baloons TID), at some point may need saline nebs Was Rx fentanyl for back pain, did help but caused edema, currently on vidocin and pain is ok

## 2010-11-20 NOTE — Assessment & Plan Note (Signed)
No change 

## 2010-11-20 NOTE — Assessment & Plan Note (Signed)
At goal  See previous BMPs, issues w/ K+ level, recently we rec to d/c HCTZ but today she reports she takes her ARB , lasix, HCTZ, K+ Plan: labs

## 2010-11-20 NOTE — Assessment & Plan Note (Signed)
Still sx despite nasal steroids, aaa claritin prn

## 2010-11-20 NOTE — Patient Instructions (Signed)
Take Claritin for a few weeks, one tablet over-the-counter daily

## 2010-11-20 NOTE — Progress Notes (Signed)
  Subjective:    Patient ID: Emily Mccullough, female    DOB: 1945-12-24, 65 y.o.   MRN: [id number]  HPI Here with her husband Increased allergy symptoms over the last 2 or 3 weeks, on a nasal steroid.  Past Medical History  Diagnosis Date  . Parkinson disease 12/09    neuro Dr.Willis, dx change to progressive Supranuclaer palsy in 2010  . Depression   . GERD (gastroesophageal reflux disease)   . Osteoporosis   . Hyperlipidemia   . Hypothyroidism   . Hypertension   . Back pain   . Hoarseness   . Colonic polyp   . Postmenopausal status   . SOB (shortness of breath)   . Breast ca 1996  . Melanoma 2006    in situ nose,s/p mohs surgery  . Allergic rhinitis     Past Surgical History  Procedure Date  . Mastectomy   . Total abdominal hysterectomy w/ bilateral salpingoophorectomy   . Fracture arm   . Spinal fusion 10/24/07    6 screws  . Cataract extraction     right     Review of Systems Runny eyes, nose congestion, some sneezing. Saw  neurology a few weeks ago, had back pain, was Rx a  fentanyl which helped but caused edema, currently on Vicodin only and doing okay.     Objective:   Physical Exam  Constitutional: She is oriented to person, place, and time. She appears well-developed.       Wheelchair bound, no apparent distress  Eyes:       Eyes are not read, some watery discharge  Cardiovascular: Normal rate and normal heart sounds.   No murmur heard. Pulmonary/Chest:       No respiratory distress, breath sounds decreased , I asked her to cough, her cough is quite weak and she has few rhonchi  Musculoskeletal: Edema: trace bilateral edema, symmetric.  Neurological: She is alert and oriented to person, place, and time.  Psychiatric: She has a normal mood and affect. Her behavior is normal. Judgment and thought content normal.          Assessment & Plan:

## 2010-11-24 ENCOUNTER — Telehealth: Payer: Self-pay | Admitting: Internal Medicine

## 2010-11-24 NOTE — Telephone Encounter (Signed)
Spoke w/ pt aware that she should only be taking lasix based on medication list since hctz was recommended to be held at office visit on 7/6

## 2010-11-25 ENCOUNTER — Other Ambulatory Visit: Payer: Self-pay | Admitting: Internal Medicine

## 2010-11-25 DIAGNOSIS — I10 Essential (primary) hypertension: Secondary | ICD-10-CM

## 2010-11-26 ENCOUNTER — Other Ambulatory Visit: Payer: Medicare Other

## 2010-11-26 ENCOUNTER — Other Ambulatory Visit (INDEPENDENT_AMBULATORY_CARE_PROVIDER_SITE_OTHER): Payer: Medicare Other

## 2010-11-26 DIAGNOSIS — I10 Essential (primary) hypertension: Secondary | ICD-10-CM

## 2010-11-26 LAB — BASIC METABOLIC PANEL
BUN: 12 mg/dL (ref 6–23)
Calcium: 9.3 mg/dL (ref 8.4–10.5)
GFR: 100.51 mL/min (ref >60.00)
Glucose, Bld: 95 mg/dL (ref 70–99)

## 2010-11-26 NOTE — Progress Notes (Signed)
Labs only

## 2010-12-10 ENCOUNTER — Ambulatory Visit (INDEPENDENT_AMBULATORY_CARE_PROVIDER_SITE_OTHER): Payer: Medicare Other | Admitting: Internal Medicine

## 2010-12-10 ENCOUNTER — Encounter: Payer: Self-pay | Admitting: Internal Medicine

## 2010-12-10 ENCOUNTER — Other Ambulatory Visit: Payer: Self-pay | Admitting: Internal Medicine

## 2010-12-10 DIAGNOSIS — J40 Bronchitis, not specified as acute or chronic: Secondary | ICD-10-CM | POA: Insufficient documentation

## 2010-12-10 MED ORDER — AZITHROMYCIN 250 MG PO TABS: ORAL_TABLET | ORAL | Status: AC

## 2010-12-10 MED ORDER — ALBUTEROL SULFATE HFA 108 (90 BASE) MCG/ACT IN AERS: 2.0000 | INHALATION_SPRAY | Freq: Four times a day (QID) | RESPIRATORY_TRACT | Status: DC

## 2010-12-10 NOTE — Progress Notes (Signed)
  Subjective:    Patient ID: Emily Mccullough, female    DOB: 1945/05/30, 65 y.o.   MRN: [id number]  HPI Here with her husband. Developed cough and wheezing yesterday, her husband gave her ventolin which he has and it helped little.  Past Medical History  Diagnosis Date  . Parkinson disease 12/09    neuro Dr.Willis, dx change to progressive Supranuclaer palsy in 2010  . Depression   . GERD (gastroesophageal reflux disease)   . Osteoporosis   . Hyperlipidemia   . Hypothyroidism   . Hypertension   . Back pain   . Hoarseness   . Colonic polyp   . Postmenopausal status   . SOB (shortness of breath)   . Breast ca 1996  . Melanoma 2006    in situ nose,s/p mohs surgery  . Allergic rhinitis    Past Surgical History  Procedure Date  . Mastectomy   . Total abdominal hysterectomy w/ bilateral salpingoophorectomy   . Fracture arm   . Spinal fusion 10/24/07    6 screws  . Cataract extraction     right      Review of Systems Denies right nose or sore throat No fevers or chills Some green sputum production. No sinus pain or headaches.     Objective:   Physical Exam  Constitutional: She appears well-developed. No distress.  HENT:  Head: Normocephalic and atraumatic.  Right Ear: External ear normal.  Left Ear: External ear normal.  Mouth/Throat: No oropharyngeal exudate.       face not tender to palpation  Cardiovascular: Normal rate, regular rhythm and normal heart sounds.   Pulmonary/Chest:       No increased work of breathing, few rhonchi bilaterally, some end expiratory wheezing. No crackles  Skin: She is not diaphoretic.          Assessment & Plan:

## 2010-12-10 NOTE — Patient Instructions (Signed)
Rest, fluids , tylenol For cough, take Mucinex DM twice a day as needed  Take the antibiotic as prescribed: zithromax Lung toilette 3 times a day  Call if no better in few days Call anytime if the symptoms are severe, you have high fever, short of breath

## 2010-12-10 NOTE — Assessment & Plan Note (Signed)
Bronchitis and some bronchospasm by history and physical. No history of asthma. She does have a weak cough so I showed the husband how to do CPT, I also recommend incentive spirometry with a balloon. He verbalized understanding. See  instructions.

## 2010-12-21 ENCOUNTER — Telehealth: Payer: Self-pay | Admitting: *Deleted

## 2010-12-21 NOTE — Telephone Encounter (Signed)
Spoke with patient today, scheduled return office visit 01/21/2011 @ 130 pm. Mail paperwork.

## 2010-12-22 ENCOUNTER — Other Ambulatory Visit: Payer: Self-pay | Admitting: Internal Medicine

## 2010-12-22 NOTE — Telephone Encounter (Signed)
New patient paperwork mailed for Colon recall.

## 2010-12-23 NOTE — Telephone Encounter (Signed)
Ok 90 and 1 RF 

## 2010-12-24 NOTE — Telephone Encounter (Signed)
Please RF 

## 2010-12-25 NOTE — Telephone Encounter (Signed)
Patient's spouse says this prescription is not at pharmacy--confirmed that pharmacy is correct--please resend on Fri--patient is out of meds   thanks

## 2010-12-27 ENCOUNTER — Other Ambulatory Visit: Payer: Self-pay | Admitting: Internal Medicine

## 2010-12-28 NOTE — Telephone Encounter (Signed)
Rx Done . 

## 2011-01-19 ENCOUNTER — Other Ambulatory Visit: Payer: Self-pay | Admitting: Internal Medicine

## 2011-01-19 NOTE — Telephone Encounter (Signed)
Hydrocodone-APAP 5-500 mg request [last refill 12/24/10 #90x0]

## 2011-01-21 ENCOUNTER — Ambulatory Visit: Payer: Medicare Other | Admitting: Internal Medicine

## 2011-01-21 NOTE — Telephone Encounter (Signed)
We did 90 and 1 RF ~ 1 month ago, needs to use the RF

## 2011-02-03 ENCOUNTER — Other Ambulatory Visit: Payer: Self-pay | Admitting: Internal Medicine

## 2011-02-12 ENCOUNTER — Other Ambulatory Visit: Payer: Self-pay | Admitting: Internal Medicine

## 2011-02-13 ENCOUNTER — Other Ambulatory Visit: Payer: Self-pay | Admitting: Internal Medicine

## 2011-02-15 NOTE — Telephone Encounter (Signed)
Pt last seen on 11/20/10, follow up in 4 months.  Appt scheduled for 03/23/11.  RX sent.

## 2011-02-17 ENCOUNTER — Other Ambulatory Visit: Payer: Self-pay | Admitting: Internal Medicine

## 2011-02-17 NOTE — Telephone Encounter (Signed)
Vicodin request [last refill 12/22/10 #90x1]

## 2011-02-18 NOTE — Telephone Encounter (Signed)
Ok 90 and 2 RF 

## 2011-03-23 ENCOUNTER — Ambulatory Visit (INDEPENDENT_AMBULATORY_CARE_PROVIDER_SITE_OTHER): Payer: Medicare Other | Admitting: Internal Medicine

## 2011-03-23 ENCOUNTER — Ambulatory Visit (HOSPITAL_BASED_OUTPATIENT_CLINIC_OR_DEPARTMENT_OTHER)
Admission: RE | Admit: 2011-03-23 | Discharge: 2011-03-23 | Disposition: A | Discharge status: Home / Self Care | Payer: Medicare Other | Source: Ambulatory Visit | Attending: Internal Medicine | Admitting: Internal Medicine

## 2011-03-23 ENCOUNTER — Encounter: Payer: Self-pay | Admitting: Internal Medicine

## 2011-03-23 VITALS — BP 116/64 | HR 85 | Temp 98.3°F | Resp 18

## 2011-03-23 DIAGNOSIS — I10 Essential (primary) hypertension: Secondary | ICD-10-CM

## 2011-03-23 DIAGNOSIS — Z23 Encounter for immunization: Secondary | ICD-10-CM

## 2011-03-23 DIAGNOSIS — E039 Hypothyroidism, unspecified: Secondary | ICD-10-CM

## 2011-03-23 DIAGNOSIS — M549 Dorsalgia, unspecified: Secondary | ICD-10-CM

## 2011-03-23 DIAGNOSIS — G238 Other specified degenerative diseases of basal ganglia: Secondary | ICD-10-CM

## 2011-03-23 NOTE — Progress Notes (Signed)
  Subjective:    Patient ID: Emily Mccullough, female    DOB: August 28, 1945, 65 y.o.   MRN: [id number]  HPI Routine office visit, nothing new to report except back pain---> She saw neurology, hydrocodone was switched to oxycodone few days ago, pain is about the same.  Past Medical History  Diagnosis Date  . Parkinson disease 12/09    neuro Dr.Willis, dx change to progressive Supranuclaer palsy in 2010  . Depression   . GERD (gastroesophageal reflux disease)   . Osteoporosis   . Hyperlipidemia   . Hypothyroidism   . Hypertension   . Back pain   . Hoarseness   . Colonic polyp   . Postmenopausal status   . SOB (shortness of breath)   . Breast ca 1996  . Melanoma 2006    in situ nose,s/p mohs surgery  . Allergic rhinitis     Review of Systems Medication list reviewed and updated. Good medication compliance with all meds. No ambulatory BPs. Also chart review, she is due for several labs.    Objective:   Physical Exam  Constitutional: She appears well-developed. No distress.  Cardiovascular: Normal rate, regular rhythm and normal heart sounds.   No murmur heard. Pulmonary/Chest: Effort normal and breath sounds normal. No respiratory distress. She has no wheezes. She has no rales.  Skin: She is not diaphoretic.  Psychiatric:       At baseline          Assessment & Plan:  Flu shot provided

## 2011-03-23 NOTE — Assessment & Plan Note (Addendum)
Used to see Dr Venia Carbon , we started to Rx med ~ 2008 Recently switched to oxycodone by neiro, not better so far rec to call in few days if not improving  XR Consider referral to ortho

## 2011-03-23 NOTE — Assessment & Plan Note (Signed)
Due for labs  Also last CBC showed slt low hg:  recheck

## 2011-03-23 NOTE — Assessment & Plan Note (Addendum)
Parking permit signed  F/u per neuro

## 2011-03-23 NOTE — Assessment & Plan Note (Signed)
Due for labs

## 2011-03-24 LAB — CBC WITH DIFFERENTIAL/PLATELET
Basophils Relative: 0.5 % (ref 0.0–3.0)
Eosinophils Relative: 1.8 % (ref 0.0–5.0)
HCT: 34.4 % — ABNORMAL LOW (ref 36.0–46.0)
Hemoglobin: 11.5 g/dL — ABNORMAL LOW (ref 12.0–15.0)
Lymphs Abs: 3.1 10*3/uL (ref 0.7–4.0)
Monocytes Relative: 10.3 % (ref 3.0–12.0)
Neutro Abs: 4 10*3/uL (ref 1.4–7.7)
RBC: 4.08 Mil/uL (ref 3.87–5.11)
RDW: 14.6 % (ref 11.5–14.6)
WBC: 8.1 10*3/uL (ref 4.5–10.5)

## 2011-03-24 LAB — BASIC METABOLIC PANEL
GFR: 98.61 mL/min (ref >60.00)
Glucose, Bld: 92 mg/dL (ref 70–99)
Potassium: 3 mEq/L — ABNORMAL LOW (ref 3.5–5.1)
Sodium: 141 mEq/L (ref 135–145)

## 2011-03-25 ENCOUNTER — Telehealth: Payer: Self-pay | Admitting: Internal Medicine

## 2011-03-25 DIAGNOSIS — I10 Essential (primary) hypertension: Secondary | ICD-10-CM

## 2011-03-25 NOTE — Telephone Encounter (Signed)
Advise patient: K is low despite taking K+ supplements Rec: D/c hydrochlorothiazide, watch for edema or increase in BP Arrange for a BMP in 1 month, dx HTN

## 2011-03-26 ENCOUNTER — Telehealth: Payer: Self-pay

## 2011-03-26 NOTE — Telephone Encounter (Signed)
Left message on personally identified voicemail to notify pt of XR results

## 2011-03-26 NOTE — Telephone Encounter (Signed)
Message copied by Beverely Low on Fri Mar 26, 2011 10:12 AM ------      Message from: Emily Mccullough      Created: Thu Mar 25, 2011  8:03 AM       Advise patient:      XR showed no acute problems

## 2011-03-30 NOTE — Telephone Encounter (Signed)
Please see below, apparently nobody has called this patient

## 2011-03-30 NOTE — Telephone Encounter (Signed)
Left message on personally identified voicemail to notify pt of lab results. Labs mailed to pt and order placed for repeat BMP in 1 month

## 2011-04-12 ENCOUNTER — Other Ambulatory Visit: Payer: Self-pay | Admitting: Internal Medicine

## 2011-04-27 ENCOUNTER — Other Ambulatory Visit: Payer: Self-pay | Admitting: Internal Medicine

## 2011-04-28 ENCOUNTER — Other Ambulatory Visit: Payer: Self-pay | Admitting: Internal Medicine

## 2011-04-28 DIAGNOSIS — I10 Essential (primary) hypertension: Secondary | ICD-10-CM

## 2011-04-29 ENCOUNTER — Other Ambulatory Visit: Payer: Medicare Other

## 2011-04-30 ENCOUNTER — Other Ambulatory Visit (INDEPENDENT_AMBULATORY_CARE_PROVIDER_SITE_OTHER): Payer: Medicare Other

## 2011-04-30 DIAGNOSIS — I10 Essential (primary) hypertension: Secondary | ICD-10-CM

## 2011-04-30 LAB — BASIC METABOLIC PANEL
BUN: 12 mg/dL (ref 6–23)
CO2: 23 mEq/L (ref 19–32)
Chloride: 103 mEq/L (ref 96–112)
Creat: 0.82 mg/dL (ref 0.50–1.10)
Glucose, Bld: 96 mg/dL (ref 70–99)

## 2011-05-09 NOTE — Progress Notes (Signed)
Thank you, will recheck K on RTC

## 2011-05-11 ENCOUNTER — Other Ambulatory Visit: Payer: Self-pay | Admitting: Internal Medicine

## 2011-05-12 ENCOUNTER — Other Ambulatory Visit: Payer: Self-pay | Admitting: Internal Medicine

## 2011-05-25 ENCOUNTER — Other Ambulatory Visit: Payer: Self-pay | Admitting: Internal Medicine

## 2011-05-25 NOTE — Telephone Encounter (Signed)
Last OV 03-23-11, last Filled 02-17-11 #90 2 

## 2011-05-31 ENCOUNTER — Other Ambulatory Visit: Payer: Self-pay | Admitting: Internal Medicine

## 2011-05-31 NOTE — Telephone Encounter (Signed)
Last OV 03-23-11, last Filled 02-17-11 #90 2

## 2011-06-01 MED ORDER — HYDROCODONE-ACETAMINOPHEN 5-500 MG PO TABS: 1.0000 | ORAL_TABLET | ORAL | Status: AC | PRN

## 2011-06-01 NOTE — Telephone Encounter (Signed)
Done

## 2011-06-01 NOTE — Telephone Encounter (Signed)
Ok 90, 3 RF 

## 2011-06-02 ENCOUNTER — Other Ambulatory Visit: Payer: Self-pay | Admitting: Internal Medicine

## 2011-06-07 NOTE — Telephone Encounter (Signed)
rx sent to pharmacy by e-script  

## 2011-06-08 ENCOUNTER — Other Ambulatory Visit: Payer: Self-pay | Admitting: Internal Medicine

## 2011-06-08 NOTE — Telephone Encounter (Signed)
Done

## 2011-06-10 NOTE — Telephone Encounter (Signed)
Rx sent 

## 2011-06-10 NOTE — Telephone Encounter (Signed)
Ok 90, 2 RF 

## 2011-07-21 ENCOUNTER — Ambulatory Visit (INDEPENDENT_AMBULATORY_CARE_PROVIDER_SITE_OTHER): Payer: Medicare Other | Admitting: Internal Medicine

## 2011-07-21 ENCOUNTER — Encounter: Payer: Self-pay | Admitting: Internal Medicine

## 2011-07-21 VITALS — BP 130/84 | HR 83 | Temp 98.1°F

## 2011-07-21 DIAGNOSIS — F3289 Other specified depressive episodes: Secondary | ICD-10-CM

## 2011-07-21 DIAGNOSIS — E785 Hyperlipidemia, unspecified: Secondary | ICD-10-CM

## 2011-07-21 DIAGNOSIS — E039 Hypothyroidism, unspecified: Secondary | ICD-10-CM

## 2011-07-21 DIAGNOSIS — I10 Essential (primary) hypertension: Secondary | ICD-10-CM | POA: Diagnosis not present

## 2011-07-21 DIAGNOSIS — F329 Major depressive disorder, single episode, unspecified: Secondary | ICD-10-CM | POA: Diagnosis not present

## 2011-07-21 DIAGNOSIS — M549 Dorsalgia, unspecified: Secondary | ICD-10-CM | POA: Diagnosis not present

## 2011-07-21 NOTE — Assessment & Plan Note (Signed)
Well controlled 

## 2011-07-21 NOTE — Assessment & Plan Note (Signed)
Due for labs

## 2011-07-21 NOTE — Assessment & Plan Note (Signed)
Will check in 4-5 months when she comes back

## 2011-07-21 NOTE — Assessment & Plan Note (Addendum)
HCTZ was d/c due to hypokalemia, BP well controlled, trace lower extr. edema. No change for now

## 2011-07-21 NOTE — Assessment & Plan Note (Signed)
Severe pain , to see Dr Noel Gerold next week, ref ?surgery

## 2011-07-21 NOTE — Progress Notes (Signed)
  Subjective:    Patient ID: Emily Mccullough, female    DOB: [birth year], 66 y.o.   MRN: [id number]  HPI ROV Doing well except for severe back pain, saw neurology, was referred to Dr Noel Gerold for consideration of surgery  Past Medical History: Parkinson, Dx 12-09--neuro Dr Anne Hahn , Dx change to Progressive Supranuclaer Palsy in 2010 Depression GERD osteoporosis Hyperlipidemia Hypothyroidism Hypertension BACK PAIN, CHRONIC   HOARSENESS, CHRONIC   COLONIC POLYPS, HX OF   POSTMENOPAUSAL STATUS  h/o SOB ---> s/p a negative Cardio-pulmonary stress test 08-27-04, normal PFT s 2006 Breast cancer, hx of 1996 2006  Melanoma in situ nose, s/p Mohs surgery Allergic rhinitis  Past Surgical History: Mastectomy Hysterectomy  and  oophorectomy 1990 STRESS TEST (DECEMBER 2002) FRACTURE, ARM,  Spinal fusion 10-24-07, 6 screws Cataract extraction --R--   Review of Systems Medication list reviewed, and compliance. Depression well controlled Since last time I saw her, we discontinue hydrochlorothiazide due to  hypokalemia, no  ambulatory blood pressures, mild edema has developed.    Objective:   Physical Exam  Alert, Ox3, NAD Lungs, CTA B CV RRR, no murmur  Ext-- symmetric trace pretibial edema       Assessment & Plan:

## 2011-07-25 ENCOUNTER — Other Ambulatory Visit: Payer: Self-pay | Admitting: Internal Medicine

## 2011-07-28 DIAGNOSIS — M961 Postlaminectomy syndrome, not elsewhere classified: Secondary | ICD-10-CM | POA: Diagnosis not present

## 2011-07-28 DIAGNOSIS — M545 Low back pain: Secondary | ICD-10-CM | POA: Diagnosis not present

## 2011-07-29 DIAGNOSIS — R269 Unspecified abnormalities of gait and mobility: Secondary | ICD-10-CM | POA: Diagnosis not present

## 2011-07-29 DIAGNOSIS — G238 Other specified degenerative diseases of basal ganglia: Secondary | ICD-10-CM | POA: Diagnosis not present

## 2011-08-06 ENCOUNTER — Ambulatory Visit: Payer: Medicare Other | Admitting: Internal Medicine

## 2011-08-06 ENCOUNTER — Ambulatory Visit (INDEPENDENT_AMBULATORY_CARE_PROVIDER_SITE_OTHER): Payer: Medicare Other | Admitting: Internal Medicine

## 2011-08-06 VITALS — BP 130/84 | HR 91 | Temp 97.7°F

## 2011-08-06 DIAGNOSIS — J40 Bronchitis, not specified as acute or chronic: Secondary | ICD-10-CM | POA: Diagnosis not present

## 2011-08-06 MED ORDER — AZITHROMYCIN 250 MG PO TABS: ORAL_TABLET | ORAL | Status: AC

## 2011-08-06 NOTE — Progress Notes (Signed)
  Subjective:    Patient ID: Emily Mccullough, female    DOB: Sep 19, 1945, 66 y.o.   MRN: [id number]  HPI Acute visit Here with her husband, one-week history of sore throat, cough, sinus and chest congestion. Taking Mucinex DM  Past Medical History:  Parkinson, Dx 12-09--neuro Dr Anne Hahn , Dx change to Progressive Supranuclaer Palsy in 2010  Depression  GERD  osteoporosis  Hyperlipidemia  Hypothyroidism  Hypertension  BACK PAIN, CHRONIC  HOARSENESS, CHRONIC  COLONIC POLYPS, HX OF  POSTMENOPAUSAL STATUS  h/o SOB ---> s/p a negative Cardio-pulmonary stress test 08-27-04, normal PFT s 2006  Breast cancer, hx of 1996  2006 Melanoma in situ nose, s/p Mohs surgery  Allergic rhinitis  Past Surgical History:  Mastectomy  Hysterectomy and oophorectomy 1990  Arm Fx  Spinal fusion 10-24-07, 6 screws  Cataract extraction ----> R  Review of Systems No fever or chills No nausea, vomiting, diarrhea. She admits to occasional wheezing, mild shortness of breath. No chest pain.     Objective:   Physical Exam Alert, oriented x3, wheelchair bound. Neck --no LADs HEENT -- L TM normal (R obscured by wax), throat w/o redness, face symmetric and not tender to palpation, nose not congested Lungs -- normal respiratory effort, no intercostal retractions, no accessory muscle  +, bilateral rhonchi, no crackles or wheezing.  Heart-- normal rate, regular rhythm, no murmur, and no gallop.   Extremities-- no pretibial edema bilaterally       Assessment & Plan:

## 2011-08-06 NOTE — Patient Instructions (Signed)
Rest, fluids , tylenol For cough, take Mucinex DM twice a day as needed  Do the percussion therapy for 5-10 minutes 3 times a day until better  If wheezing, use albuterol Take the antibiotic as prescribed ----> zithromax Call if no better in few days Call anytime if the symptoms are severe

## 2011-08-06 NOTE — Assessment & Plan Note (Addendum)
Presents with bronchitis, no wheezing at the time of exam however they report some wheezing. See Instructions CPT to help mobilization of secretions

## 2011-08-07 ENCOUNTER — Other Ambulatory Visit: Payer: Self-pay | Admitting: Internal Medicine

## 2011-08-08 ENCOUNTER — Encounter: Payer: Self-pay | Admitting: Internal Medicine

## 2011-08-09 NOTE — Telephone Encounter (Signed)
Refill done.  

## 2011-08-12 ENCOUNTER — Telehealth: Payer: Self-pay | Admitting: Internal Medicine

## 2011-08-12 NOTE — Telephone Encounter (Signed)
Jose, I'm not sure why I got this telephone note. ? another antibiotic or have her seen in the Saturday clinic.

## 2011-08-12 NOTE — Telephone Encounter (Signed)
Patients husband called & states patient has finished the below prescribed on 3.22  azithromycin (ZITHROMAX Z-PAK) 250 MG tablet  6 each  0  08/06/2011  08/11/2011   As directed      He states she is no better & would like something else called in to the pharmacy  Can call Jean Rosenthal at [phone]

## 2011-08-14 MED ORDER — DOXYCYCLINE HYCLATE 100 MG PO TABS: 100.0000 mg | ORAL_TABLET | Freq: Two times a day (BID) | ORAL | Status: AC

## 2011-08-14 NOTE — Telephone Encounter (Signed)
Spoke w/ the husband today, still having cough, no F/C, she reports a slt SOB but no apparent difficulty per husband. I sent doxycycline to her pharmacy Recommend ER if sx not improving, F/C, more SOB

## 2011-08-16 NOTE — Telephone Encounter (Signed)
Your patient; [employer]

## 2011-08-24 DIAGNOSIS — M5137 Other intervertebral disc degeneration, lumbosacral region: Secondary | ICD-10-CM | POA: Diagnosis not present

## 2011-08-24 DIAGNOSIS — M545 Low back pain: Secondary | ICD-10-CM | POA: Diagnosis not present

## 2011-08-24 DIAGNOSIS — M4310 Spondylolisthesis, site unspecified: Secondary | ICD-10-CM | POA: Diagnosis not present

## 2011-09-07 DIAGNOSIS — M545 Low back pain: Secondary | ICD-10-CM | POA: Diagnosis not present

## 2011-09-07 DIAGNOSIS — IMO0002 Reserved for concepts with insufficient information to code with codable children: Secondary | ICD-10-CM | POA: Diagnosis not present

## 2011-09-08 ENCOUNTER — Other Ambulatory Visit: Payer: Self-pay | Admitting: Rehabilitation

## 2011-09-08 DIAGNOSIS — M545 Low back pain: Secondary | ICD-10-CM

## 2011-09-10 ENCOUNTER — Other Ambulatory Visit: Payer: Self-pay | Admitting: Internal Medicine

## 2011-09-10 NOTE — Telephone Encounter (Signed)
Refill done.  

## 2011-09-13 ENCOUNTER — Ambulatory Visit
Admission: RE | Admit: 2011-09-13 | Discharge: 2011-09-13 | Disposition: A | Discharge status: Home / Self Care | Payer: Medicare Other | Source: Ambulatory Visit | Attending: Rehabilitation | Admitting: Rehabilitation

## 2011-09-13 DIAGNOSIS — M25569 Pain in unspecified knee: Secondary | ICD-10-CM | POA: Diagnosis not present

## 2011-09-13 DIAGNOSIS — M5126 Other intervertebral disc displacement, lumbar region: Secondary | ICD-10-CM | POA: Diagnosis not present

## 2011-09-13 DIAGNOSIS — M5137 Other intervertebral disc degeneration, lumbosacral region: Secondary | ICD-10-CM | POA: Diagnosis not present

## 2011-09-13 DIAGNOSIS — M545 Low back pain: Secondary | ICD-10-CM

## 2011-09-13 MED ORDER — GADOBENATE DIMEGLUMINE 529 MG/ML IV SOLN
20.0000 mL | Freq: Once | INTRAVENOUS | Status: AC | PRN
  Administered 2011-09-13: 20 mL via INTRAVENOUS

## 2011-09-22 DIAGNOSIS — M5137 Other intervertebral disc degeneration, lumbosacral region: Secondary | ICD-10-CM | POA: Diagnosis not present

## 2011-09-22 DIAGNOSIS — IMO0002 Reserved for concepts with insufficient information to code with codable children: Secondary | ICD-10-CM | POA: Diagnosis not present

## 2011-09-30 ENCOUNTER — Other Ambulatory Visit: Payer: Self-pay | Admitting: Internal Medicine

## 2011-09-30 NOTE — Telephone Encounter (Signed)
Refill done.  

## 2011-10-03 ENCOUNTER — Other Ambulatory Visit: Payer: Self-pay | Admitting: Internal Medicine

## 2011-10-04 NOTE — Telephone Encounter (Signed)
i do not see on current med list. OK to refill?

## 2011-10-07 NOTE — Telephone Encounter (Signed)
Denied it was discontinued do to hypokalemia

## 2011-10-23 ENCOUNTER — Other Ambulatory Visit: Payer: Self-pay | Admitting: Internal Medicine

## 2011-10-25 NOTE — Telephone Encounter (Signed)
Refill done.  

## 2011-11-03 DIAGNOSIS — IMO0002 Reserved for concepts with insufficient information to code with codable children: Secondary | ICD-10-CM | POA: Diagnosis not present

## 2011-11-03 DIAGNOSIS — M5137 Other intervertebral disc degeneration, lumbosacral region: Secondary | ICD-10-CM | POA: Diagnosis not present

## 2011-11-08 ENCOUNTER — Other Ambulatory Visit: Payer: Self-pay | Admitting: Internal Medicine

## 2011-11-08 NOTE — Telephone Encounter (Signed)
Refill done.  

## 2011-12-15 ENCOUNTER — Ambulatory Visit: Payer: Medicare Other | Admitting: Internal Medicine

## 2011-12-23 ENCOUNTER — Ambulatory Visit: Payer: Medicare Other | Admitting: Internal Medicine

## 2011-12-28 DIAGNOSIS — H04129 Dry eye syndrome of unspecified lacrimal gland: Secondary | ICD-10-CM | POA: Diagnosis not present

## 2011-12-28 DIAGNOSIS — Z961 Presence of intraocular lens: Secondary | ICD-10-CM | POA: Diagnosis not present

## 2011-12-28 DIAGNOSIS — H2510 Age-related nuclear cataract, unspecified eye: Secondary | ICD-10-CM | POA: Diagnosis not present

## 2011-12-28 DIAGNOSIS — D3130 Benign neoplasm of unspecified choroid: Secondary | ICD-10-CM | POA: Diagnosis not present

## 2012-01-01 ENCOUNTER — Other Ambulatory Visit: Payer: Self-pay | Admitting: Internal Medicine

## 2012-01-03 NOTE — Telephone Encounter (Signed)
Ok for #30, no refills.  Dr Drue Novel will fill remainder when he returns.

## 2012-01-03 NOTE — Telephone Encounter (Signed)
Refill done.  

## 2012-01-03 NOTE — Telephone Encounter (Signed)
Ok to refill 

## 2012-01-18 ENCOUNTER — Telehealth: Payer: Self-pay | Admitting: Internal Medicine

## 2012-01-18 MED ORDER — HYDROCODONE-ACETAMINOPHEN 5-500 MG PO TABS: 1.0000 | ORAL_TABLET | ORAL | Status: DC | PRN

## 2012-01-18 NOTE — Telephone Encounter (Signed)
Refill: Hydrocodone/acetaminophen 5-500 mg. Take 1 tablet by mouth every 4 hours as needed for pain. Qty 30. Last fill 01-03-12

## 2012-01-18 NOTE — Telephone Encounter (Signed)
Ok 40, due for a checkup, please arrange

## 2012-01-18 NOTE — Telephone Encounter (Signed)
Refill done.  

## 2012-01-18 NOTE — Telephone Encounter (Signed)
Ok to refill 

## 2012-02-03 DIAGNOSIS — R269 Unspecified abnormalities of gait and mobility: Secondary | ICD-10-CM | POA: Diagnosis not present

## 2012-02-03 DIAGNOSIS — G238 Other specified degenerative diseases of basal ganglia: Secondary | ICD-10-CM | POA: Diagnosis not present

## 2012-02-03 DIAGNOSIS — G20 Parkinson's disease: Secondary | ICD-10-CM | POA: Diagnosis not present

## 2012-02-04 DIAGNOSIS — IMO0002 Reserved for concepts with insufficient information to code with codable children: Secondary | ICD-10-CM | POA: Diagnosis not present

## 2012-02-04 DIAGNOSIS — M5137 Other intervertebral disc degeneration, lumbosacral region: Secondary | ICD-10-CM | POA: Diagnosis not present

## 2012-02-04 DIAGNOSIS — M4310 Spondylolisthesis, site unspecified: Secondary | ICD-10-CM | POA: Diagnosis not present

## 2012-02-04 DIAGNOSIS — M545 Low back pain: Secondary | ICD-10-CM | POA: Diagnosis not present

## 2012-02-11 ENCOUNTER — Other Ambulatory Visit: Payer: Self-pay | Admitting: Internal Medicine

## 2012-02-11 NOTE — Telephone Encounter (Signed)
Refill done.  

## 2012-02-25 ENCOUNTER — Other Ambulatory Visit: Payer: Self-pay | Admitting: Internal Medicine

## 2012-02-25 MED ORDER — AMITRIPTYLINE HCL 25 MG PO TABS: 25.0000 mg | ORAL_TABLET | Freq: Two times a day (BID) | ORAL | Status: DC

## 2012-02-25 NOTE — Telephone Encounter (Signed)
If she has been taking Elavil 25 mg twice a day all along, okay to change sig and call #[phone], 1 refill

## 2012-02-25 NOTE — Telephone Encounter (Signed)
Spoke to the pharmacy. The pt still has #90 left on file but they pharmacy will not let her fill it because they have the pt is taking the rx 1 po qd & we have the same thing. The pt states she is taking the it 1 po BID. Please advise.

## 2012-02-25 NOTE — Telephone Encounter (Signed)
Refill done.  

## 2012-02-25 NOTE — Telephone Encounter (Signed)
Pt husband called and she thought she was suppose to get theis med from the there doctor- they just found out that Drue Novel is the one she should get it from.  Pt needs Amitriptyline HCl (Tab) ELAVIL  (Not once a day but twice a day), needs to called into [facility] at [address] [place]. Pls call pt to let them know she is all out of the med

## 2012-03-18 ENCOUNTER — Other Ambulatory Visit: Payer: Self-pay | Admitting: Internal Medicine

## 2012-03-20 NOTE — Telephone Encounter (Signed)
Refill done.  

## 2012-05-03 DIAGNOSIS — M545 Low back pain: Secondary | ICD-10-CM | POA: Diagnosis not present

## 2012-05-03 DIAGNOSIS — M25559 Pain in unspecified hip: Secondary | ICD-10-CM | POA: Diagnosis not present

## 2012-05-11 ENCOUNTER — Other Ambulatory Visit: Payer: Self-pay | Admitting: Internal Medicine

## 2012-05-11 NOTE — Telephone Encounter (Signed)
Refill done.  

## 2012-05-30 ENCOUNTER — Other Ambulatory Visit: Payer: Self-pay | Admitting: Internal Medicine

## 2012-05-31 NOTE — Telephone Encounter (Signed)
Spoke to [place] at pharmacy & pt still has refills left on file. Refill request sent in error.

## 2012-07-01 ENCOUNTER — Other Ambulatory Visit: Payer: Self-pay

## 2012-07-24 ENCOUNTER — Other Ambulatory Visit: Payer: Self-pay | Admitting: Internal Medicine

## 2012-07-25 NOTE — Telephone Encounter (Signed)
Refill for protonix sent to [facility] in [place]

## 2012-08-02 ENCOUNTER — Encounter: Payer: Self-pay | Admitting: Neurology

## 2012-08-02 ENCOUNTER — Ambulatory Visit (INDEPENDENT_AMBULATORY_CARE_PROVIDER_SITE_OTHER): Payer: Medicare Other | Admitting: Neurology

## 2012-08-02 VITALS — BP 136/75 | HR 90

## 2012-08-02 DIAGNOSIS — M549 Dorsalgia, unspecified: Secondary | ICD-10-CM | POA: Diagnosis not present

## 2012-08-02 NOTE — Progress Notes (Signed)
   Reason for visit: Progressive supranuclear palsy  Emily Mccullough is an 67 y.o. female  History of present illness:  Emily Mccullough is a 67 year old right-handed white female with a history of a progressive supranuclear palsy. The patient has had ongoing progression of her deficits, and her dysarthria has worsened over time. The patient continues to deny that she has any problems with swallowing. The patient is able to take her medications without choking. The patient continues to have some back and right hip pain, but the oxycodone is controlling the pain. The patient indicates that she has increased pain when trying to stand up. The patient is able to stand for transfers, but she is nonambulatory otherwise. The patient has had an occasional fall. The patient is able to read without any significant difficulty. The patient returns for an evaluation. No other new medical issues have come up since last seen.   ROS:  Out of a complete 14 system review of symptoms, the patient complains only of the following symptoms, and all other reviewed systems are negative.  Moles Easy bruising Allergies Runny nose slurred speech Disinterest in activities   Blood pressure 136/75, pulse 90.  Physical Exam  General: The patient is alert and cooperative at the time of the examination. The patient is moderately obese.  Skin: No significant peripheral edema is noted.   Neurologic Exam  Cranial nerves: Facial symmetry is present. Speech is quite dysarthric, not aphasic. Speech is hypophonic. Extraocular movements are full, with the exception that there may be some restriction of superior gaze. Visual fields are full.  Motor: The patient has good strength in all 4 extremities.  Coordination: The patient has good finger-nose-finger and heel-to-shin bilaterally.  Gait and station: The patient is nonambulatory, she is wheelchair-bound. Gait was not attempted.   Reflexes: Deep tendon reflexes are  symmetric.   Assessment/Plan:  One. Progressive supranuclear palsy  2. Gait disorder  3. Chronic low back pain, right hip pain  The patient will be continued on the oxycodone. The patient is having more and more problems with speech, and she has severe dysarthria and hypophonia. At some point, swallowing may become an issue. The patient will follow up through this office in 6-8 months.   Marlan Palau MD 08/02/2012 3:10 PM

## 2012-08-02 NOTE — Patient Instructions (Addendum)
Progressive Supranuclear Palsy Progressive supranuclear palsy (PSP) is a rare brain disorder. It causes serious and permanent problems with control of gait and balance. The symptoms of PSP are caused by a gradual deterioration of brain cells in a few tiny but important places at the base of the brain. This region is called the brainstem. SYMPTOMS   The most obvious sign of the disease is an inability to aim the eyes properly. This happens because of lesions in the area of the brain that coordinates eye movements. Some patients describe this effect as a blurring.  PSP patients often show changes mood and behavior. This includes:  Depression.  Apathy.  Progressive mild dementia.  The pattern of signs and symptoms can vary greatly from person to person. DIAGNOSIS  PSP is often misdiagnosed because some of its symptoms are very much like those of:  Parkinson's disease.  Alzheimer's disease.  Rare neurodegenerative disorders, such as Creutzfeldt-Jakob disease. The key to establishing the diagnosis of PSP is the identification of early gait instability and difficulty moving the eyes, the hallmark of the disease. Also important is ruling out other similar disorders, some of which are treatable. Although PSP gets progressively worse, no one dies from PSP itself. TREATMENT  There is currently no effective treatment for PSP. Scientists are searching for better ways to manage the disease.  In some patients, the slowness, stiffness, and balance problems of PSP may respond to antiparkinsonian agents such as levodopa, or levodopa combined with anticholinergic agents. But the effect is usually temporary.  The speech, vision, and swallowing difficulties usually do not respond to any drug treatment. Another group of drugs that has been of some modest success in PSP are antidepressant medications. The most commonly used of these drugs are Elavil, and Tofranil. The anti-PSP benefit of these drugs seems  not to be related to their ability to relieve depression.  Non-drug treatment for PSP can take many forms. Patients often use weighted walking aids because of their tendency to fall backward.  Bifocals or special glasses called prisms are sometimes prescribed for PSP patients. They can correct the difficulty of looking down.  Formal physical therapy is of no proven benefit in PSP. But certain exercises can keep the joints limber.  A surgical procedure may be needed when there are swallowing disturbances. It is called a gastrostomy. This surgery involves the placement of a tube through the skin of the abdomen into the stomach (intestine) for feeding purposes. Document Released: 04/23/2002 Document Revised: 07/26/2011 Document Reviewed: 05/03/2005 [facility] Patient Information 2013 [place], [place].

## 2012-08-08 ENCOUNTER — Encounter: Payer: Self-pay | Admitting: *Deleted

## 2012-08-08 ENCOUNTER — Other Ambulatory Visit: Payer: Self-pay | Admitting: Internal Medicine

## 2012-08-08 NOTE — Telephone Encounter (Signed)
Refill done. Pt is due for an OV.  

## 2012-08-09 NOTE — Telephone Encounter (Signed)
Pt schedule CPE 4.21.14 Refill done.

## 2012-08-25 ENCOUNTER — Other Ambulatory Visit: Payer: Self-pay

## 2012-08-25 MED ORDER — OXYCODONE-ACETAMINOPHEN 10-325 MG PO TABS: 1.0000 | ORAL_TABLET | Freq: Four times a day (QID) | ORAL | Status: DC | PRN

## 2012-08-25 NOTE — Telephone Encounter (Signed)
Spouse called requesting a refill on Oxycodone.  They would like the rx mailed to them when ready.

## 2012-09-01 ENCOUNTER — Encounter: Payer: Self-pay | Admitting: Lab

## 2012-09-04 ENCOUNTER — Encounter: Payer: Self-pay | Admitting: Internal Medicine

## 2012-09-04 ENCOUNTER — Ambulatory Visit (INDEPENDENT_AMBULATORY_CARE_PROVIDER_SITE_OTHER): Payer: Medicare Other | Admitting: Internal Medicine

## 2012-09-04 VITALS — BP 132/82 | HR 83 | Temp 98.2°F

## 2012-09-04 DIAGNOSIS — I10 Essential (primary) hypertension: Secondary | ICD-10-CM

## 2012-09-04 DIAGNOSIS — G238 Other specified degenerative diseases of basal ganglia: Secondary | ICD-10-CM | POA: Diagnosis not present

## 2012-09-04 DIAGNOSIS — F329 Major depressive disorder, single episode, unspecified: Secondary | ICD-10-CM

## 2012-09-04 DIAGNOSIS — Z79899 Other long term (current) drug therapy: Secondary | ICD-10-CM | POA: Diagnosis not present

## 2012-09-04 DIAGNOSIS — E039 Hypothyroidism, unspecified: Secondary | ICD-10-CM | POA: Diagnosis not present

## 2012-09-04 DIAGNOSIS — E538 Deficiency of other specified B group vitamins: Secondary | ICD-10-CM

## 2012-09-04 DIAGNOSIS — J309 Allergic rhinitis, unspecified: Secondary | ICD-10-CM | POA: Diagnosis not present

## 2012-09-04 DIAGNOSIS — E785 Hyperlipidemia, unspecified: Secondary | ICD-10-CM

## 2012-09-04 DIAGNOSIS — Z5181 Encounter for therapeutic drug level monitoring: Secondary | ICD-10-CM | POA: Diagnosis not present

## 2012-09-04 DIAGNOSIS — Z853 Personal history of malignant neoplasm of breast: Secondary | ICD-10-CM

## 2012-09-04 DIAGNOSIS — M810 Age-related osteoporosis without current pathological fracture: Secondary | ICD-10-CM

## 2012-09-04 MED ORDER — FLUTICASONE PROPIONATE 50 MCG/ACT NA SUSP: 2.0000 | Freq: Every day | NASAL | Status: DC

## 2012-09-04 NOTE — Assessment & Plan Note (Signed)
At this point, she is taking Elavil, symptoms are currently well controlled

## 2012-09-04 NOTE — Assessment & Plan Note (Signed)
Good medication compliance, check a TSH 

## 2012-09-04 NOTE — Assessment & Plan Note (Addendum)
Has a diagnosis of osteoporosis, No DEXA in the chart that I can tell; about 2 years ago we talk about not to pursue further DEXAs

## 2012-09-04 NOTE — Assessment & Plan Note (Signed)
H/o def, on no supplements: labs

## 2012-09-04 NOTE — Patient Instructions (Addendum)
Use Flonase consistently, Claritin 10 mg OTC daily as needed as well. If your allergy symptoms don't improve, let me know. Please come back in 6 months, fasting for a checkup

## 2012-09-04 NOTE — Assessment & Plan Note (Signed)
Seems well controlled, refill medications as needed, labs.

## 2012-09-04 NOTE — Assessment & Plan Note (Signed)
Not using Flonase consistently, refill provided, if symptoms persist, she will call because will need eyedrops

## 2012-09-04 NOTE — Assessment & Plan Note (Signed)
Patient not fasting today

## 2012-09-04 NOTE — Assessment & Plan Note (Signed)
Per neurology 

## 2012-09-04 NOTE — Assessment & Plan Note (Signed)
Dx 1996, s/p R mastectomy, last MMG 04-2009 (-), wishes not to pursue further eval, hard for her to tolerate MMGs (can't stand)

## 2012-09-04 NOTE — Progress Notes (Signed)
  Subjective:    Patient ID: Emily Mccullough, female    DOB: Nov 04, 1945, 67 y.o.   MRN: [id number]  HPI Here for a routine office visit, last visit with me 07/2011. Declined a CPX PSP-- sees neurology regulalrly Hypertension, good medication compliance, no ambulatory BPs, BP today very good. History of allergies, both nasal and eye itching and congestion, has not been using Flonase regularly. H/o  GERD, takes medications routinely, very rarely has symptoms.  Past Medical History:   Parkinson, Dx 12-09--neuro Dr Anne Hahn , Dx change to Progressive Supranuclaer Palsy in 2010   Depression   GERD   osteoporosis   Hyperlipidemia   Hypothyroidism   Hypertension   BACK PAIN, CHRONIC   HOARSENESS, CHRONIC   COLONIC POLYPS, HX OF   POSTMENOPAUSAL STATUS   h/o SOB ---> s/p a negative Cardio-pulmonary stress test 08-27-04, normal PFT s 2006   Breast cancer, hx of 1996   2006 Melanoma in situ nose, s/p Mohs surgery   Allergic rhinitis    Past Surgical History:   Mastectomy   Hysterectomy and oophorectomy 1990   Arm Fx   Spinal fusion 10-24-07, 6 screws   Cataract extraction ----> R   Review of Systems Denies chest pain or shortness of breath No abdominal pain or diarrhea. Appetite is normal, denies having problems swallowing such as choking. No anxiety or depression per se, understandably with PSP she feels discouraged some days.    Objective:   Physical Exam  General -- alert, well-developed, No apparent distress, sitting in a wheelchair.   Lungs -- normal respiratory effort, no intercostal retractions, no accessory muscle use, and normal breath sounds.   Heart-- normal rate, regular rhythm, no murmur, and no gallop.   Extremities-- no pretibial edema bilaterally  Neurologic-- alert & oriented X3 , Dysarthric, speech is hypophonic. Motor is symmetric. Psych-- Cognition and judgment appear intact. Alert and cooperative with normal attention span and concentration.  not anxious  appearing and not depressed appearing.       Assessment & Plan:

## 2012-09-05 LAB — BASIC METABOLIC PANEL
BUN: 11 mg/dL (ref 6–23)
CO2: 28 mEq/L (ref 19–32)
Calcium: 9.2 mg/dL (ref 8.4–10.5)
Creatinine, Ser: 0.7 mg/dL (ref 0.4–1.2)

## 2012-09-05 LAB — CBC WITH DIFFERENTIAL/PLATELET
Basophils Absolute: 0.1 10*3/uL (ref 0.0–0.1)
Basophils Relative: 0.5 % (ref 0.0–3.0)
Eosinophils Absolute: 0.1 10*3/uL (ref 0.0–0.7)
Lymphocytes Relative: 21.3 % (ref 12.0–46.0)
MCHC: 33 g/dL (ref 30.0–36.0)
MCV: 79.6 fl (ref 78.0–100.0)
Monocytes Absolute: 0.3 10*3/uL (ref 0.1–1.0)
Neutrophils Relative %: 74 % (ref 43.0–77.0)
Platelets: 328 10*3/uL (ref 150.0–400.0)
RBC: 4.15 Mil/uL (ref 3.87–5.11)
WBC: 9.8 10*3/uL (ref 4.5–10.5)

## 2012-09-05 LAB — VITAMIN B12: Vitamin B-12: 569 pg/mL (ref 211–911)

## 2012-09-05 LAB — TSH: TSH: 0.82 u[IU]/mL (ref 0.35–5.50)

## 2012-09-06 ENCOUNTER — Other Ambulatory Visit: Payer: Self-pay | Admitting: Internal Medicine

## 2012-09-06 NOTE — Telephone Encounter (Signed)
Refill done.  

## 2012-09-20 ENCOUNTER — Other Ambulatory Visit: Payer: Self-pay | Admitting: General Practice

## 2012-09-20 ENCOUNTER — Telehealth: Payer: Self-pay | Admitting: *Deleted

## 2012-09-20 MED ORDER — LEVOTHYROXINE SODIUM 125 MCG PO TABS: ORAL_TABLET | ORAL | Status: DC

## 2012-09-20 MED ORDER — FUROSEMIDE 20 MG PO TABS: ORAL_TABLET | ORAL | Status: DC

## 2012-09-20 NOTE — Telephone Encounter (Signed)
Refill done.  

## 2012-09-20 NOTE — Telephone Encounter (Signed)
Med filled.  

## 2012-09-29 ENCOUNTER — Other Ambulatory Visit: Payer: Self-pay | Admitting: Internal Medicine

## 2012-09-29 NOTE — Telephone Encounter (Signed)
Refill done.  

## 2012-10-01 ENCOUNTER — Other Ambulatory Visit: Payer: Self-pay | Admitting: Internal Medicine

## 2012-10-02 NOTE — Telephone Encounter (Signed)
Refill done.  

## 2012-10-05 DIAGNOSIS — H902 Conductive hearing loss, unspecified: Secondary | ICD-10-CM | POA: Diagnosis not present

## 2012-10-05 DIAGNOSIS — H6120 Impacted cerumen, unspecified ear: Secondary | ICD-10-CM | POA: Diagnosis not present

## 2012-10-13 ENCOUNTER — Other Ambulatory Visit: Payer: Self-pay

## 2012-10-13 MED ORDER — OXYCODONE-ACETAMINOPHEN 10-325 MG PO TABS: 1.0000 | ORAL_TABLET | Freq: Four times a day (QID) | ORAL | Status: DC | PRN

## 2012-10-13 NOTE — Telephone Encounter (Signed)
Spouse called, left message saying patient is down to one Oxycodone and must get refill today.  Call back number [phone]

## 2012-11-21 ENCOUNTER — Other Ambulatory Visit: Payer: Self-pay

## 2012-11-21 MED ORDER — OXYCODONE-ACETAMINOPHEN 10-325 MG PO TABS: 1.0000 | ORAL_TABLET | Freq: Four times a day (QID) | ORAL | Status: DC | PRN

## 2012-11-21 NOTE — Telephone Encounter (Signed)
Spouse called, left message requesting a refill on Oxycodone.  They would like the Rx mailed to them when it's ready.

## 2012-12-14 ENCOUNTER — Telehealth: Payer: Self-pay | Admitting: Neurology

## 2012-12-14 NOTE — Telephone Encounter (Signed)
Pt called back to r/s.

## 2012-12-20 ENCOUNTER — Other Ambulatory Visit: Payer: Self-pay

## 2012-12-22 ENCOUNTER — Other Ambulatory Visit: Payer: Self-pay

## 2012-12-22 MED ORDER — OXYCODONE-ACETAMINOPHEN 10-325 MG PO TABS: 1.0000 | ORAL_TABLET | Freq: Four times a day (QID) | ORAL | Status: DC | PRN

## 2012-12-22 NOTE — Telephone Encounter (Signed)
Rx signed and mailed.

## 2012-12-22 NOTE — Telephone Encounter (Signed)
Spouse called requesting a refill on Oxycodone.  They would like the Rx mailed to them when it's ready.

## 2012-12-28 DIAGNOSIS — H2589 Other age-related cataract: Secondary | ICD-10-CM | POA: Diagnosis not present

## 2012-12-28 DIAGNOSIS — Z961 Presence of intraocular lens: Secondary | ICD-10-CM | POA: Diagnosis not present

## 2012-12-28 DIAGNOSIS — H04129 Dry eye syndrome of unspecified lacrimal gland: Secondary | ICD-10-CM | POA: Diagnosis not present

## 2012-12-29 ENCOUNTER — Other Ambulatory Visit: Payer: Self-pay | Admitting: Internal Medicine

## 2012-12-29 NOTE — Telephone Encounter (Signed)
Spoke with pharmacy. Refills left on file. Request sent in error.  

## 2013-01-19 ENCOUNTER — Other Ambulatory Visit: Payer: Self-pay

## 2013-01-19 MED ORDER — OXYCODONE-ACETAMINOPHEN 10-325 MG PO TABS: 1.0000 | ORAL_TABLET | Freq: Four times a day (QID) | ORAL | Status: DC | PRN

## 2013-01-19 NOTE — Telephone Encounter (Signed)
Rx signed and mailed.

## 2013-01-19 NOTE — Telephone Encounter (Signed)
Mr Raglin called requesting a refill on Oxycodone for the patient.  He would like the Rx mailed to them when it's ready.

## 2013-01-26 ENCOUNTER — Other Ambulatory Visit: Payer: Self-pay | Admitting: Internal Medicine

## 2013-01-26 NOTE — Telephone Encounter (Signed)
Med filled.  

## 2013-01-31 ENCOUNTER — Other Ambulatory Visit: Payer: Self-pay | Admitting: Internal Medicine

## 2013-01-31 NOTE — Telephone Encounter (Signed)
rx refilled per protocol. DJR  

## 2013-02-02 ENCOUNTER — Ambulatory Visit: Payer: Medicare Other | Admitting: Neurology

## 2013-02-19 ENCOUNTER — Ambulatory Visit (INDEPENDENT_AMBULATORY_CARE_PROVIDER_SITE_OTHER): Payer: Medicare Other | Admitting: Neurology

## 2013-02-19 ENCOUNTER — Encounter: Payer: Self-pay | Admitting: Neurology

## 2013-02-19 VITALS — BP 140/87 | HR 87

## 2013-02-19 DIAGNOSIS — G238 Other specified degenerative diseases of basal ganglia: Secondary | ICD-10-CM

## 2013-02-19 MED ORDER — OXYCODONE HCL 15 MG PO TABS: 15.0000 mg | ORAL_TABLET | Freq: Four times a day (QID) | ORAL | Status: DC | PRN

## 2013-02-19 NOTE — Progress Notes (Signed)
Reason for visit: Progressive supranuclear palsy  Emily Mccullough is an 67 y.o. female  History of present illness:  Emily Mccullough is a 67 year old right-handed white female with a history of progressive supranuclear palsy. The patient has had ongoing progression of her speech. The patient is having a lot of difficulty with vocalization, with hypophonia and dysarthria. The patient still indicates that she has not had any problems swallowing food or fluids or her medications. The patient has had an occasional fall since last seen, usually with trying to get up out of bed, sliding onto the floor. The patient has ongoing back pain, and the Percocet is not helping her pain throughout the day. The patient returns to this office for an evaluation.  Past Medical History  Diagnosis Date  . Parkinson disease 12/09    neuro Dr.Willis, dx change to progressive Supranuclaer palsy in 2010  . Depression   . GERD (gastroesophageal reflux disease)   . Osteoporosis   . Hyperlipidemia   . Hypothyroidism   . Hypertension   . Back pain   . Hoarseness   . Colonic polyp   . Postmenopausal status   . SOB (shortness of breath)   . Allergic rhinitis   . Abnormality of gait 08/02/2012  . Obese     Moderate  . Breast CA 1996  . Melanoma 2006    in situ nose,s/p mohs surgery  . Skin cancer   . Headache(784.0)   . Migraine   . Lumbago     Past Surgical History  Procedure Laterality Date  . Mastectomy    . Total abdominal hysterectomy w/ bilateral salpingoophorectomy    . Fracture arm    . Spinal fusion  10/24/07    6 screws  . Cataract extraction      right    Family History  Problem Relation Age of Onset  . Coronary artery disease Father     MI at age 5  . Stroke      GF  . Emphysema Father   . Colon cancer Father   . Diabetes Mother   . Hypertension Mother   . Hypertension Father   . Breast cancer Maternal Aunt     Social history:  reports that she has quit smoking. She does not  have any smokeless tobacco history on file. She reports that she does not drink alcohol. Her drug history is not on file.   No Known Allergies  Medications:  Current Outpatient Prescriptions on File Prior to Visit  Medication Sig Dispense Refill  . amitriptyline (ELAVIL) 25 MG tablet TAKE 1 TABLET BY MOUTH TWICE DAILY  180 tablet  1  . CALCIUM-VITAMIN D PO Take by mouth.        . fluticasone (FLONASE) 50 MCG/ACT nasal spray Place 2 sprays into the nose daily.  16 g  10  . furosemide (LASIX) 20 MG tablet TAKE 2 TABLETS BY MOUTH DAILY  180 tablet  0  . levothyroxine (SYNTHROID, LEVOTHROID) 125 MCG tablet TAKE 1 TABLET BY MOUTH DAILY  30 tablet  6  . losartan (COZAAR) 100 MG tablet TAKE 1 TABLET BY MOUTH EVERY DAY  30 tablet  11  . losartan (COZAAR) 100 MG tablet TAKE 1 TABLET BY MOUTH EVERY DAY  30 tablet  11  . Multiple Vitamin (MULTIVITAMIN) capsule Take 1 capsule by mouth daily.        . pantoprazole (PROTONIX) 40 MG tablet TAKE 1 TABLET BY MOUTH TWICE DAILY  60 tablet  0  . pantoprazole (PROTONIX) 40 MG tablet TAKE 1 TABLET BY MOUTH TWICE DAILY  60 tablet  0  . potassium chloride SA (K-DUR,KLOR-CON) 20 MEQ tablet TAKE 2 TABLETS BY MOUTH TWICE DAILY  360 tablet  0  . potassium chloride SA (K-DUR,KLOR-CON) 20 MEQ tablet TAKE 2 TABLETS BY MOUTH TWICE DAILY  120 tablet  6  . PROAIR HFA 108 (90 BASE) MCG/ACT inhaler INHALE 2 PUFFS INTO THE LUNGS FOUR TIMES DAILY  8.5 g  1   No current facility-administered medications on file prior to visit.    ROS:  Out of a complete 14 system review of symptoms, the patient complains only of the following symptoms, and all other reviewed systems are negative.  Blurred vision Itching sensations Feeling hot Gait disorder Speech problems  Blood pressure 140/87, pulse 87, weight 0 lb (0 kg).  Physical Exam  General: The patient is alert and cooperative at the time of the examination.  Skin: No significant peripheral edema is noted.   Neurologic  Exam  Cranial nerves: Facial symmetry is present. Speech is hypophonic, dysarthric. Extraocular movements are full. Visual fields are full.  Motor: The patient has good strength in all 4 extremities.  Coordination: The patient has good finger-nose-finger and heel-to-shin bilaterally.  Gait and station: The patient is wheelchair-bound, cannot be ambulated. No drift is seen.  Reflexes: Deep tendon reflexes are symmetric.   Assessment/Plan:  One. Progressive supranuclear palsy  2. Gait disorder  3. Chronic low back pain  The patient is having ongoing difficulty with pain. The patient will be placed on the 15 mg oxycodone tablets. The patient will need to contact our office if she does develops problems with swallowing. For now, the patient has adequate equipment at home for her transfers. The patient has a bedside commode, and a wheelchair. The patient followup in 6 months.  Marlan Palau MD 02/19/2013 7:36 PM  [facility] [address] [place], [place] [address]  Phone [phone] Fax [phone]

## 2013-03-04 ENCOUNTER — Other Ambulatory Visit: Payer: Self-pay | Admitting: Internal Medicine

## 2013-03-05 ENCOUNTER — Other Ambulatory Visit: Payer: Self-pay | Admitting: *Deleted

## 2013-03-05 MED ORDER — PANTOPRAZOLE SODIUM 40 MG PO TBEC: DELAYED_RELEASE_TABLET | ORAL | Status: DC

## 2013-03-05 NOTE — Telephone Encounter (Signed)
Protonix refill sent to pharmacy

## 2013-03-06 ENCOUNTER — Ambulatory Visit (INDEPENDENT_AMBULATORY_CARE_PROVIDER_SITE_OTHER): Payer: Medicare Other | Admitting: Internal Medicine

## 2013-03-06 ENCOUNTER — Encounter: Payer: Self-pay | Admitting: Internal Medicine

## 2013-03-06 VITALS — BP 116/78 | HR 86 | Temp 98.6°F

## 2013-03-06 DIAGNOSIS — M549 Dorsalgia, unspecified: Secondary | ICD-10-CM

## 2013-03-06 DIAGNOSIS — E785 Hyperlipidemia, unspecified: Secondary | ICD-10-CM | POA: Diagnosis not present

## 2013-03-06 DIAGNOSIS — E039 Hypothyroidism, unspecified: Secondary | ICD-10-CM

## 2013-03-06 DIAGNOSIS — I10 Essential (primary) hypertension: Secondary | ICD-10-CM

## 2013-03-06 DIAGNOSIS — Z23 Encounter for immunization: Secondary | ICD-10-CM

## 2013-03-06 DIAGNOSIS — E538 Deficiency of other specified B group vitamins: Secondary | ICD-10-CM

## 2013-03-06 DIAGNOSIS — D649 Anemia, unspecified: Secondary | ICD-10-CM | POA: Diagnosis not present

## 2013-03-06 DIAGNOSIS — Z0000 Encounter for general adult medical examination without abnormal findings: Secondary | ICD-10-CM

## 2013-03-06 LAB — LIPID PANEL
Cholesterol: 226 mg/dL — ABNORMAL HIGH (ref 0–200)
Total CHOL/HDL Ratio: 5
Triglycerides: 242 mg/dL — ABNORMAL HIGH (ref 0.0–149.0)

## 2013-03-06 LAB — TSH: TSH: 0.22 u[IU]/mL — ABNORMAL LOW (ref 0.35–5.50)

## 2013-03-06 LAB — BASIC METABOLIC PANEL
CO2: 28 mEq/L (ref 19–32)
Calcium: 9.6 mg/dL (ref 8.4–10.5)
Creatinine, Ser: 0.7 mg/dL (ref 0.4–1.2)
GFR: 86.96 mL/min (ref >60.00)
Sodium: 140 mEq/L (ref 135–145)

## 2013-03-06 NOTE — Assessment & Plan Note (Signed)
Well-controlled, check a BMP, no change 

## 2013-03-06 NOTE — Assessment & Plan Note (Addendum)
Mild anemia per labs 4-14, last colonoscopy in 2008 (was a complicated procedure), was recommended to have a repeat colonoscopy 3 years later.   At this point, given other comorbidities, I don't think is in her best interest to have another colonoscopy, pt agrees , monitor a Hg today

## 2013-03-06 NOTE — Assessment & Plan Note (Signed)
Due for labs

## 2013-03-06 NOTE — Assessment & Plan Note (Signed)
Last B12 within normal

## 2013-03-06 NOTE — Patient Instructions (Signed)
Get your blood work before you leave  Next visit in 4 months for a check up  

## 2013-03-06 NOTE — Progress Notes (Signed)
  Subjective:    Patient ID: Emily Mccullough, female    DOB: February 17, 1946, 66 y.o.   MRN: [id number]  HPI Routine office visit, here with her husband. Communication is limited due to PSP however she was able to let me know that she is doing okay and taking all her medications. Could not tell me her amb BPs. Currently not feeling anxious or depressed. Has back pain, neurology prescribed Oxycodone, pain under better control.  Past Medical History  Diagnosis Date  . PSP (progressive supranuclear palsy) 12/09    neuro Dr.Willis, initially dx as parkinson, dx changed to PSP in 2010  . Depression   . GERD (gastroesophageal reflux disease)   . Osteoporosis   . Hyperlipidemia   . Hypothyroidism   . Hypertension   . Chronic back pain   . Hoarseness   . Colonic polyp   . Postmenopausal status   . SOB (shortness of breath)     s/p a negative Cardio-pulmonary stress test 08-27-04, normal PFT s 2006    . Allergic rhinitis   . Obese     Moderate  . Breast CA 1996  . Melanoma 2006    in situ nose,s/p mohs surgery  . Headache(784.0)     migraines    Past Surgical History  Procedure Laterality Date  . Mastectomy    . Total abdominal hysterectomy w/ bilateral salpingoophorectomy  1990  . Fracture arm    . Spinal fusion  10/24/07    6 screws  . Cataract extraction      right   History   Social History  . Marital Status: Married    Spouse Name: N/A    Number of Children: N/A  . Years of Education: N/A   Occupational History  . disable    Social History Main Topics  . Smoking status: Former Games developer  . Smokeless tobacco: Never Used  . Alcohol Use: No  . Drug Use: Not on file  . Sexual Activity: Not on file   Other Topics Concern  . Not on file   Social History Narrative  . No narrative on file     Review of Systems No chest pain or shortness or breath No recent falls No lower extremity edema reported (some edema on exam) No nausea-vomiting    Objective:   Physical  Exam BP 116/78  Pulse 86  Temp(Src) 98.6 F (37 C)  SpO2 95%  General -- alert, well-developed, NAD. Seems well kept, clean clothes, good hygine  Lungs -- normal respiratory effort, no intercostal retractions, no accessory muscle use, and normal breath sounds.  Heart-- normal rate, regular rhythm, no murmur.   Extremities-- trace pretibial edema bilaterally  Neurologic--  PSP changes , no formal neuro exam Psych--   No anxious appearing , no depressed appearing.      Assessment & Plan:

## 2013-03-06 NOTE — Assessment & Plan Note (Signed)
Td 05 Flu shot today PNM shot today Will discuss other issues on RTC

## 2013-03-06 NOTE — Assessment & Plan Note (Addendum)
We discussed further cholesterol checks and she would like to keep checking. Labs today (although pt is not fasting)

## 2013-03-06 NOTE — Assessment & Plan Note (Addendum)
Recently neurology prescribe oxycodone, patient reports better control at this time. RF meds if/when needed UDS @ this office low risk 08-2012

## 2013-03-12 ENCOUNTER — Telehealth: Payer: Self-pay | Admitting: Internal Medicine

## 2013-03-12 MED ORDER — LEVOTHYROXINE SODIUM 125 MCG PO TABS: ORAL_TABLET | ORAL | Status: DC

## 2013-03-12 NOTE — Telephone Encounter (Signed)
Received no fax from [facility] about pts rx request. rx ordered and sent to pharmacy. Pt notified. DJR

## 2013-03-12 NOTE — Telephone Encounter (Signed)
Patient husband called about refilling his wife levothyroxine (SYNTHROID, LEVOTHROID) 125 MCG tablet. [facility] stated that they did not have that dosage in and was going to fax another request over. Patient husband was upset and wanted to see how long does it take to refill a rx. thanks

## 2013-03-13 ENCOUNTER — Other Ambulatory Visit: Payer: Self-pay | Admitting: Internal Medicine

## 2013-03-13 NOTE — Telephone Encounter (Signed)
Patient's husband called very upset and using foul language in regards to the patient's rx. Per lab result note on 03/09/13 it says "Needs to decrease synthroid 125 mcg to 100 mcg daily, call a new prescription #30 and 3 refills."   Pt's husband states that he does not understand why we keep sending this prescription in for to her [facility]. Please correct. Patient husband wants a call from our office when this is complete.

## 2013-03-13 NOTE — Telephone Encounter (Signed)
Levothyroxine 100 mcg prescription sent to [facility] pharmacy. #30, 3 refills. Patient's husband notified.

## 2013-03-15 ENCOUNTER — Encounter: Payer: Self-pay | Admitting: Internal Medicine

## 2013-03-16 ENCOUNTER — Other Ambulatory Visit: Payer: Self-pay | Admitting: *Deleted

## 2013-03-16 MED ORDER — NORTRIPTYLINE HCL 25 MG PO CAPS: 25.0000 mg | ORAL_CAPSULE | Freq: Every day | ORAL | Status: DC

## 2013-03-16 NOTE — Telephone Encounter (Signed)
Message copied by Eustace Quail on Fri Mar 16, 2013  4:12 PM ------      Message from: Willow Ora E      Created: Fri Mar 16, 2013  3:49 PM      Regarding: send a Rx        nortriptyline 25 mg one tablet twice a day #180 and 1 Rf      D/c Amitriptyline from the medication list ------

## 2013-03-16 NOTE — Telephone Encounter (Signed)
rx ordered nortriptyline 25 mg per dr Drue Novel request and to D/C amitriptyline. DJR

## 2013-03-22 ENCOUNTER — Other Ambulatory Visit: Payer: Self-pay | Admitting: Neurology

## 2013-03-22 ENCOUNTER — Telehealth: Payer: Self-pay | Admitting: Neurology

## 2013-03-22 MED ORDER — OXYCODONE HCL 15 MG PO TABS: 15.0000 mg | ORAL_TABLET | Freq: Four times a day (QID) | ORAL | Status: DC | PRN

## 2013-03-23 NOTE — Telephone Encounter (Signed)
Call patient and left message that her prescription was being mail out today and if she has any questions or concerns to call the office.

## 2013-03-29 ENCOUNTER — Other Ambulatory Visit: Payer: Self-pay | Admitting: Internal Medicine

## 2013-03-30 NOTE — Telephone Encounter (Signed)
Pantoprazole refilled. 

## 2013-04-19 ENCOUNTER — Other Ambulatory Visit: Payer: Self-pay

## 2013-04-19 MED ORDER — OXYCODONE HCL 15 MG PO TABS: 15.0000 mg | ORAL_TABLET | Freq: Four times a day (QID) | ORAL | Status: DC | PRN

## 2013-04-19 NOTE — Telephone Encounter (Signed)
Spouse called requesting a refill on Oxycodone.  They would like the Rx mailed when it's ready.

## 2013-04-20 NOTE — Telephone Encounter (Signed)
Called patient to inform her that her prescription was being mailed out today and if she has any other problems, questions or concerns to call the office.

## 2013-04-23 ENCOUNTER — Other Ambulatory Visit (INDEPENDENT_AMBULATORY_CARE_PROVIDER_SITE_OTHER): Payer: Medicare Other

## 2013-04-23 DIAGNOSIS — E039 Hypothyroidism, unspecified: Secondary | ICD-10-CM | POA: Diagnosis not present

## 2013-04-24 LAB — TSH: TSH: 0.71 u[IU]/mL (ref 0.35–5.50)

## 2013-04-30 ENCOUNTER — Other Ambulatory Visit: Payer: Self-pay | Admitting: Internal Medicine

## 2013-04-30 NOTE — Telephone Encounter (Signed)
rx refilled per protocol. DJR  

## 2013-05-22 ENCOUNTER — Other Ambulatory Visit: Payer: Self-pay

## 2013-05-22 MED ORDER — OXYCODONE HCL 15 MG PO TABS: 15.0000 mg | ORAL_TABLET | Freq: Four times a day (QID) | ORAL | Status: DC | PRN

## 2013-05-22 NOTE — Telephone Encounter (Signed)
Mr Glennon Mac called requesting a refill on Oxycodone for the pt.  They would like the Rx mailed to them when it is ready.

## 2013-05-22 NOTE — Telephone Encounter (Signed)
Rx for Oxycodone mailed. VM left that Rx in mail today.

## 2013-05-24 ENCOUNTER — Other Ambulatory Visit: Payer: Self-pay | Admitting: Internal Medicine

## 2013-06-20 ENCOUNTER — Other Ambulatory Visit: Payer: Self-pay | Admitting: Neurology

## 2013-06-20 MED ORDER — OXYCODONE HCL 15 MG PO TABS: 15.0000 mg | ORAL_TABLET | Freq: Four times a day (QID) | ORAL | Status: DC | PRN

## 2013-06-20 NOTE — Telephone Encounter (Signed)
Pt's husband called in for a refill on his wife's oxycodone.  They have asked that it be mailed to them. Please call if necessary.  Thank you

## 2013-06-20 NOTE — Telephone Encounter (Signed)
Patient's Rx was mailed out today. °

## 2013-06-22 ENCOUNTER — Other Ambulatory Visit: Payer: Self-pay | Admitting: Internal Medicine

## 2013-07-06 ENCOUNTER — Ambulatory Visit: Payer: Medicare Other | Admitting: Internal Medicine

## 2013-07-13 ENCOUNTER — Encounter: Payer: Self-pay | Admitting: Internal Medicine

## 2013-07-13 ENCOUNTER — Ambulatory Visit (INDEPENDENT_AMBULATORY_CARE_PROVIDER_SITE_OTHER): Payer: Medicare Other | Admitting: Internal Medicine

## 2013-07-13 VITALS — BP 124/81 | HR 90 | Temp 98.0°F

## 2013-07-13 DIAGNOSIS — F3289 Other specified depressive episodes: Secondary | ICD-10-CM

## 2013-07-13 DIAGNOSIS — E039 Hypothyroidism, unspecified: Secondary | ICD-10-CM | POA: Diagnosis not present

## 2013-07-13 DIAGNOSIS — M549 Dorsalgia, unspecified: Secondary | ICD-10-CM

## 2013-07-13 DIAGNOSIS — F329 Major depressive disorder, single episode, unspecified: Secondary | ICD-10-CM

## 2013-07-13 DIAGNOSIS — I10 Essential (primary) hypertension: Secondary | ICD-10-CM | POA: Diagnosis not present

## 2013-07-13 NOTE — Assessment & Plan Note (Addendum)
Will try to obtain UDS today, getting samples may be a challenge. Addendum---- unable to obtain a UDS

## 2013-07-13 NOTE — Patient Instructions (Addendum)
Go to the lab before you leave : UDS   Next visit is for routine check up regards your   blood pressure , depression  , thyroid  in 3 months  No need to come back fasting Please make an appointment

## 2013-07-13 NOTE — Assessment & Plan Note (Signed)
Symptoms well controlled at this time.

## 2013-07-13 NOTE — Progress Notes (Signed)
Subjective:    Patient ID: Emily Mccullough, female    DOB: 07/17/1945, 68 y.o.   MRN: [id number]  DOS:  07/13/2013 Reason for visit: ROV Here with her husband for a routine office visit In general feels well, no change in her general status. Medication list reviewed, good compliance without apparent side effects.  ROS Communication with the patient is somehow difficult but I was able to get the following information: No fever or chills Emotionally doing well Taking pain medication with good control of her symptoms Occasionally her urine has a distinctive odor but denies any dysuria, frequency or gross hematuria. Denies nausea, vomiting, diarrhea  Past Medical History  Diagnosis Date  . PSP (progressive supranuclear palsy) 12/09    neuro Dr.Willis, initially dx as parkinson, dx changed to PSP in 2010  . Depression   . GERD (gastroesophageal reflux disease)   . Osteoporosis   . Hyperlipidemia   . Hypothyroidism   . Hypertension   . Chronic back pain   . Hoarseness   . Colonic polyp   . Postmenopausal status   . SOB (shortness of breath)     s/p a negative Cardio-pulmonary stress test 08-27-04, normal PFT s 2006    . Allergic rhinitis   . Obese     Moderate  . Breast CA 1996  . Melanoma 2006    in situ nose,s/p mohs surgery  . Headache(784.0)     migraines     Past Surgical History  Procedure Laterality Date  . Mastectomy    . Total abdominal hysterectomy w/ bilateral salpingoophorectomy  1990  . Fracture arm    . Spinal fusion  10/24/07    6 screws  . Cataract extraction      right    History   Social History  . Marital Status: Married    Spouse Name: N/A    Number of Children: N/A  . Years of Education: N/A   Occupational History  . disable    Social History Main Topics  . Smoking status: Former Research scientist (life sciences)  . Smokeless tobacco: Never Used  . Alcohol Use: No  . Drug Use: Not on file  . Sexual Activity: Not on file   Other Topics Concern  . Not on  file   Social History Narrative  . No narrative on file        Medication List       This list is accurate as of: 07/13/13 11:59 PM.  Always use your most recent med list.               CALCIUM-VITAMIN D PO  Take by mouth.     fluticasone 50 MCG/ACT nasal spray  Commonly known as:  FLONASE  Place 2 sprays into the nose daily.     furosemide 20 MG tablet  Commonly known as:  LASIX  TAKE 2 TABLETS BY MOUTH DAILY     levothyroxine 100 MCG tablet  Commonly known as:  SYNTHROID, LEVOTHROID  TAKE 1 TABLET BY MOUTH DAILY BEFORE BREAKFAST     losartan 100 MG tablet  Commonly known as:  COZAAR  TAKE 1 TABLET BY MOUTH EVERY DAY     multivitamin capsule  Take 1 capsule by mouth daily.     nortriptyline 25 MG capsule  Commonly known as:  PAMELOR  Take 1 capsule (25 mg total) by mouth at bedtime.     oxyCODONE 15 MG immediate release tablet  Commonly known as:  ROXICODONE  Take 1  tablet (15 mg total) by mouth every 6 (six) hours as needed for pain (Must last 28 days).     pantoprazole 40 MG tablet  Commonly known as:  PROTONIX  TAKE 1 TABLET BY MOUTH TWICE DAILY     potassium chloride SA 20 MEQ tablet  Commonly known as:  K-DUR,KLOR-CON  TAKE 2 TABLETS BY MOUTH TWICE DAILY     PROAIR HFA 108 (90 BASE) MCG/ACT inhaler  Generic drug:  albuterol  INHALE 2 PUFFS INTO THE LUNGS FOUR TIMES DAILY           Objective:   Physical Exam BP 124/81  Pulse 90  Temp(Src) 98 F (36.7 C)  SpO2 97% General -- alert, well-developed,Wheelchair-bound.  HEENT-- Not pale.  Lungs -- normal respiratory effort, no intercostal retractions, no accessory muscle use, and normal breath sounds.  Heart-- normal rate, regular rhythm, no murmur.  Extremities-- no pretibial edema bilaterally   Psych--   No anxious or depressed appearing.      Assessment & Plan:

## 2013-07-13 NOTE — Assessment & Plan Note (Signed)
Well-controlled, last BMP normal, reassess when she comes back in 3 months

## 2013-07-13 NOTE — Assessment & Plan Note (Signed)
Last TSH stable, no change, reassess in 3 months

## 2013-07-16 ENCOUNTER — Telehealth: Payer: Self-pay | Admitting: Internal Medicine

## 2013-07-16 NOTE — Telephone Encounter (Signed)
Relevant patient education assigned to patient using Emmi. ° °

## 2013-07-20 ENCOUNTER — Other Ambulatory Visit: Payer: Self-pay | Admitting: Neurology

## 2013-07-20 MED ORDER — OXYCODONE HCL 15 MG PO TABS: 15.0000 mg | ORAL_TABLET | Freq: Four times a day (QID) | ORAL | Status: DC | PRN

## 2013-07-20 NOTE — Telephone Encounter (Signed)
Patient's husband was notified that the rx would be mailed out today.

## 2013-07-20 NOTE — Telephone Encounter (Signed)
Patient needs written Rx for Oxycodone--please mail to patient

## 2013-07-21 ENCOUNTER — Other Ambulatory Visit: Payer: Self-pay | Admitting: Internal Medicine

## 2013-08-05 ENCOUNTER — Other Ambulatory Visit: Payer: Self-pay | Admitting: Internal Medicine

## 2013-08-16 ENCOUNTER — Encounter: Payer: Self-pay | Admitting: Internal Medicine

## 2013-08-23 ENCOUNTER — Encounter: Payer: Self-pay | Admitting: Neurology

## 2013-08-23 ENCOUNTER — Ambulatory Visit (INDEPENDENT_AMBULATORY_CARE_PROVIDER_SITE_OTHER): Payer: Medicare Other | Admitting: Neurology

## 2013-08-23 VITALS — BP 128/80 | HR 60

## 2013-08-23 DIAGNOSIS — R269 Unspecified abnormalities of gait and mobility: Secondary | ICD-10-CM | POA: Diagnosis not present

## 2013-08-23 DIAGNOSIS — G238 Other specified degenerative diseases of basal ganglia: Secondary | ICD-10-CM | POA: Diagnosis not present

## 2013-08-23 MED ORDER — OXYCODONE HCL 15 MG PO TABS: 15.0000 mg | ORAL_TABLET | Freq: Four times a day (QID) | ORAL | Status: DC | PRN

## 2013-08-23 NOTE — Progress Notes (Signed)
Reason for visit: Progressive supranuclear palsy  Emily Mccullough is an 68 y.o. female  History of present illness:  Emily Mccullough is a 68 year old right-handed white female with a history of a progressive supranuclear palsy. The patient has been essentially nonambulatory, she is able to stand for transfers. The patient has a bedside commode that she uses at nighttime. The patient has not had any significant falls since last seen. The patient denies problems with swallowing, but her speech has become much more dysarthric, and hypophonic, and difficult to understand. The patient will use a tablet to type and communicate. The patient has not had problems controlling the bowels or the bladder. The patient is on oxycodone for pain, and the dose was increased to a 15 mg tablet 4 times daily. The patient indicates that this level of pain medication is effective for her.   Past Medical History  Diagnosis Date  . PSP (progressive supranuclear palsy) 12/09    neuro Dr.Jalisa Sacco, initially dx as parkinson, dx changed to PSP in 2010  . Depression   . GERD (gastroesophageal reflux disease)   . Osteoporosis   . Hyperlipidemia   . Hypothyroidism   . Hypertension   . Chronic back pain   . Hoarseness   . Colonic polyp   . Postmenopausal status   . SOB (shortness of breath)     s/p a negative Cardio-pulmonary stress test 08-27-04, normal PFT s 2006    . Allergic rhinitis   . Obese     Moderate  . Breast CA 1996  . Melanoma 2006    in situ nose,s/p mohs surgery  . Headache(784.0)     migraines     Past Surgical History  Procedure Laterality Date  . Mastectomy    . Total abdominal hysterectomy w/ bilateral salpingoophorectomy  1990  . Fracture arm    . Spinal fusion  10/24/07    6 screws  . Cataract extraction      right    Family History  Problem Relation Age of Onset  . Coronary artery disease Father     MI at age 75  . Stroke      GF  . Emphysema Father   . Colon cancer Father     . Diabetes Mother   . Hypertension Mother   . Hypertension Father   . Breast cancer Maternal Aunt     Social history:  reports that she has quit smoking. She has never used smokeless tobacco. She reports that she does not drink alcohol. Her drug history is not on file.    Allergies  Allergen Reactions  . Neosporin [Neomycin-Bacitracin Zn-Polymyx] Rash    Medications:  Current Outpatient Prescriptions on File Prior to Visit  Medication Sig Dispense Refill  . CALCIUM-VITAMIN D PO Take by mouth.        . fluticasone (FLONASE) 50 MCG/ACT nasal spray Place 2 sprays into the nose daily.  16 g  10  . furosemide (LASIX) 20 MG tablet TAKE 2 TABLETS BY MOUTH EVERY DAY  180 tablet  0  . levothyroxine (SYNTHROID, LEVOTHROID) 100 MCG tablet TAKE 1 TABLET BY MOUTH EVERY DAY BEFORE BREAKFAST  30 tablet  2  . losartan (COZAAR) 100 MG tablet TAKE 1 TABLET BY MOUTH EVERY DAY  30 tablet  11  . Multiple Vitamin (MULTIVITAMIN) capsule Take 1 capsule by mouth daily.        . nortriptyline (PAMELOR) 25 MG capsule Take 1 capsule (25 mg total) by mouth  at bedtime.  180 capsule  1  . pantoprazole (PROTONIX) 40 MG tablet TAKE 1 TABLET BY MOUTH TWICE DAILY  60 tablet  0  . potassium chloride SA (K-DUR,KLOR-CON) 20 MEQ tablet TAKE 2 TABLETS BY MOUTH TWICE DAILY  120 tablet  0  . PROAIR HFA 108 (90 BASE) MCG/ACT inhaler INHALE 2 PUFFS INTO THE LUNGS FOUR TIMES DAILY  8.5 g  1   No current facility-administered medications on file prior to visit.    ROS:  Out of a complete 14 system review of symptoms, the patient complains only of the following symptoms, and all other reviewed systems are negative.  Speech difficulty Back pain Daytime sleepiness Drooling  Blood pressure 128/80, pulse 60, weight 0 lb (0 kg).  Physical Exam  General: The patient is alert and cooperative at the time of the examination. The patient is moderately obese.  Skin: No significant peripheral edema is noted.   Neurologic  Exam  Mental status: The patient is oriented x 3.  Cranial nerves: Facial symmetry is present. Speech is quite dysarthric, hypophonic. Extraocular movements are full. Visual fields are full. Prominent masking of the face is seen. The patient has reduced mobility of the tongue, can protrude the tongue from the mouth.  Motor: The patient has good strength in all 4 extremities.  Sensory examination: Soft touch sensation is symmetric on the face, arms, or legs.  Coordination: The patient has good finger-nose-finger and heel-to-shin bilaterally.  Gait and station: The patient is able to stand, cannot effectively ambulate. Feet are "glued to the floor".  Reflexes: Deep tendon reflexes are symmetric.   Assessment/Plan:  One. Progressive supranuclear palsy  2. Gait disorder  3. Chronic low back pain  The patient will continue the oxycodone for now, a prescription was written today. The patient will followup through this office in 6 months. The patient will need to watch for difficulty with dysphagia that may occur in the future.  Jill Alexanders MD 08/23/2013 7:52 PM  [facility] [address] [place], [place] [address]  Phone [phone] Fax [phone]

## 2013-08-23 NOTE — Patient Instructions (Signed)
Progressive Supranuclear Palsy Progressive supranuclear palsy (PSP) is a rare brain disorder. It causes serious and permanent problems with control of gait and balance. The symptoms of PSP are caused by a gradual deterioration of brain cells in a few tiny but important places at the base of the brain. This region is called the brainstem. SYMPTOMS   The most obvious sign of the disease is an inability to aim the eyes properly. This happens because of lesions in the area of the brain that coordinates eye movements. Some patients describe this effect as a blurring.  PSP patients often show changes mood and behavior. This includes:  Depression.  Apathy.  Progressive mild dementia.  The pattern of signs and symptoms can vary greatly from person to person. DIAGNOSIS  PSP is often misdiagnosed because some of its symptoms are very much like those of:  Parkinson's disease.  Alzheimer's disease.  Rare neurodegenerative disorders, such as Creutzfeldt-Jakob disease. The key to establishing the diagnosis of PSP is the identification of early gait instability and difficulty moving the eyes, the hallmark of the disease. Also important is ruling out other similar disorders, some of which are treatable. Although PSP gets progressively worse, no one dies from PSP itself. TREATMENT  There is currently no effective treatment for PSP. Scientists are searching for better ways to manage the disease.  In some patients, the slowness, stiffness, and balance problems of PSP may respond to antiparkinsonian agents such as levodopa, or levodopa combined with anticholinergic agents. But the effect is usually temporary.  The speech, vision, and swallowing difficulties usually do not respond to any drug treatment. Another group of drugs that has been of some modest success in PSP are antidepressant medications. The most commonly used of these drugs are Elavil, and Tofranil. The anti-PSP benefit of these drugs seems  not to be related to their ability to relieve depression.  Non-drug treatment for PSP can take many forms. Patients often use weighted walking aids because of their tendency to fall backward.  Bifocals or special glasses called prisms are sometimes prescribed for PSP patients. They can correct the difficulty of looking down.  Formal physical therapy is of no proven benefit in PSP. But certain exercises can keep the joints limber.  A surgical procedure may be needed when there are swallowing disturbances. It is called a gastrostomy. This surgery involves the placement of a tube through the skin of the abdomen into the stomach (intestine) for feeding purposes. Document Released: 04/23/2002 Document Revised: 07/26/2011 Document Reviewed: 05/03/2005 [employer] Patient Information 2014 [employer].  

## 2013-08-31 ENCOUNTER — Other Ambulatory Visit (INDEPENDENT_AMBULATORY_CARE_PROVIDER_SITE_OTHER): Payer: Medicare Other

## 2013-08-31 ENCOUNTER — Telehealth: Payer: Self-pay | Admitting: Internal Medicine

## 2013-08-31 DIAGNOSIS — N390 Urinary tract infection, site not specified: Secondary | ICD-10-CM

## 2013-08-31 DIAGNOSIS — B952 Enterococcus as the cause of diseases classified elsewhere: Secondary | ICD-10-CM

## 2013-08-31 LAB — POCT URINALYSIS DIPSTICK
Bilirubin, UA: NEGATIVE
Glucose, UA: NEGATIVE
Ketones, UA: NEGATIVE
Nitrite, UA: POSITIVE
PROTEIN UA: NEGATIVE
Spec Grav, UA: >=1.03
Urobilinogen, UA: 0.2
pH, UA: 6

## 2013-08-31 NOTE — Telephone Encounter (Signed)
4.17.15  Pt called in to let Dr. Larose Kells know that it only burns when she urinates and is sitting.  Please advise.

## 2013-09-03 LAB — URINE CULTURE: Colony Count: >=100000

## 2013-09-03 MED ORDER — CIPROFLOXACIN HCL 500 MG PO TABS: 500.0000 mg | ORAL_TABLET | Freq: Two times a day (BID) | ORAL | Status: DC

## 2013-09-03 NOTE — Telephone Encounter (Signed)
Ciprofloxacin 500 mg one by mouth twice a day #6 no refills. Let us know if not improving

## 2013-09-03 NOTE — Telephone Encounter (Signed)
Rx for cipro sent to [facility], pts husband made aware.

## 2013-09-03 NOTE — Telephone Encounter (Signed)
Results from urine culture show E Coli, what do you want to prescribe?

## 2013-09-19 ENCOUNTER — Other Ambulatory Visit: Payer: Self-pay | Admitting: Internal Medicine

## 2013-09-23 ENCOUNTER — Other Ambulatory Visit: Payer: Self-pay | Admitting: Internal Medicine

## 2013-09-24 NOTE — Telephone Encounter (Signed)
Pantoprazole refilled per protocol. JG//[facility]

## 2013-09-30 ENCOUNTER — Other Ambulatory Visit: Payer: Self-pay | Admitting: Neurology

## 2013-10-01 MED ORDER — OXYCODONE HCL 15 MG PO TABS: 15.0000 mg | ORAL_TABLET | Freq: Four times a day (QID) | ORAL | Status: DC | PRN

## 2013-10-01 NOTE — Telephone Encounter (Signed)
Request forwarded to provider for approval  

## 2013-10-02 NOTE — Telephone Encounter (Signed)
Pt's Rx was mailed out today. °

## 2013-10-10 ENCOUNTER — Encounter: Payer: Self-pay | Admitting: Internal Medicine

## 2013-10-10 ENCOUNTER — Ambulatory Visit (INDEPENDENT_AMBULATORY_CARE_PROVIDER_SITE_OTHER): Payer: Medicare Other | Admitting: Internal Medicine

## 2013-10-10 VITALS — BP 124/81 | HR 86 | Temp 97.7°F

## 2013-10-10 DIAGNOSIS — Z23 Encounter for immunization: Secondary | ICD-10-CM

## 2013-10-10 DIAGNOSIS — I10 Essential (primary) hypertension: Secondary | ICD-10-CM | POA: Diagnosis not present

## 2013-10-10 DIAGNOSIS — B952 Enterococcus as the cause of diseases classified elsewhere: Secondary | ICD-10-CM

## 2013-10-10 DIAGNOSIS — F3289 Other specified depressive episodes: Secondary | ICD-10-CM | POA: Diagnosis not present

## 2013-10-10 DIAGNOSIS — F329 Major depressive disorder, single episode, unspecified: Secondary | ICD-10-CM

## 2013-10-10 DIAGNOSIS — Z0000 Encounter for general adult medical examination without abnormal findings: Secondary | ICD-10-CM

## 2013-10-10 DIAGNOSIS — N390 Urinary tract infection, site not specified: Secondary | ICD-10-CM | POA: Diagnosis not present

## 2013-10-10 DIAGNOSIS — E039 Hypothyroidism, unspecified: Secondary | ICD-10-CM

## 2013-10-10 NOTE — Assessment & Plan Note (Signed)
Due for labs

## 2013-10-10 NOTE — Progress Notes (Signed)
Subjective:    Patient ID: Emily Mccullough, female    DOB: [birth year], 68 y.o.   MRN: [id number]  DOS:  10/10/2013 Type of  Visit: routine visit Hypertension,  good medication compliance, no ambulatory BPs Hypothyroidism, good medication compliance, chart review, due for a TSH UTI, last urine culture showed a UTI, status post Cipro, now asymptomatic. Today we also discussed the appropriateness of prevnar and a  colonoscopy, see assessment and plan  ROS Communication somewhat difficult but I was able to obtain the following information from the patient: Denies fever or chills No chest pain or difficulty breathing No nausea, vomiting, diarrhea or blood in the stools. No anxiety or depression  Past Medical History  Diagnosis Date  . PSP (progressive supranuclear palsy) 12/09    neuro Dr.Willis, initially dx as parkinson, dx changed to PSP in 2010  . Depression   . GERD (gastroesophageal reflux disease)   . Osteoporosis   . Hyperlipidemia   . Hypothyroidism   . Hypertension   . Chronic back pain   . Hoarseness   . Colonic polyp   . Postmenopausal status   . SOB (shortness of breath)     s/p a negative Cardio-pulmonary stress test 08-27-04, normal PFT s 2006    . Allergic rhinitis   . Obese     Moderate  . Breast CA 1996  . Melanoma 2006    in situ nose,s/p mohs surgery  . Headache(784.0)     migraines     Past Surgical History  Procedure Laterality Date  . Mastectomy    . Total abdominal hysterectomy w/ bilateral salpingoophorectomy  1990  . Fracture arm    . Spinal fusion  10/24/07    6 screws  . Cataract extraction      right    History   Social History  . Marital Status: Married    Spouse Name: N/A    Number of Children: N/A  . Years of Education: N/A   Occupational History  . disable    Social History Main Topics  . Smoking status: Former Research scientist (life sciences)  . Smokeless tobacco: Never Used  . Alcohol Use: No  . Drug Use: Not on file  . Sexual Activity: Not on  file   Other Topics Concern  . Not on file   Social History Narrative  . No narrative on file        Medication List       This list is accurate as of: 10/10/13  5:37 PM.  Always use your most recent med list.               CALCIUM-VITAMIN D PO  Take by mouth.     fluticasone 50 MCG/ACT nasal spray  Commonly known as:  FLONASE  Place 2 sprays into the nose daily.     furosemide 20 MG tablet  Commonly known as:  LASIX  TAKE 2 TABLETS BY MOUTH EVERY DAY     levothyroxine 100 MCG tablet  Commonly known as:  SYNTHROID, LEVOTHROID  TAKE 1 TABLET BY MOUTH EVERY DAY BEFORE BREAKFAST     losartan 100 MG tablet  Commonly known as:  COZAAR  TAKE 1 TABLET BY MOUTH EVERY DAY     multivitamin capsule  Take 1 capsule by mouth daily.     nortriptyline 25 MG capsule  Commonly known as:  PAMELOR  Take 1 capsule (25 mg total) by mouth at bedtime.     oxyCODONE 15 MG immediate release  tablet  Commonly known as:  ROXICODONE  Take 1 tablet (15 mg total) by mouth every 6 (six) hours as needed for pain (Must last 28 days).     pantoprazole 40 MG tablet  Commonly known as:  PROTONIX  TAKE 1 TABLET BY MOUTH TWICE DAILY     potassium chloride SA 20 MEQ tablet  Commonly known as:  K-DUR,KLOR-CON  TAKE 2 TABLETS BY MOUTH TWICE DAILY     PROAIR HFA 108 (90 BASE) MCG/ACT inhaler  Generic drug:  albuterol  INHALE 2 PUFFS INTO THE LUNGS FOUR TIMES DAILY           Objective:   Physical Exam BP 124/81  Pulse 86  Temp(Src) 97.7 F (36.5 C)  SpO2 95% General -- alert, well-developed, NAD.  Lungs -- normal respiratory effort, no intercostal retractions, no accessory muscle use, and normal breath sounds.  Heart-- normal rate, regular rhythm, no murmur.   Extremities-- trace  pretibial edema bilaterally  Neurologic--  Alert,ox3 Facial symmetry is present. Speech is quite dysarthric, hypophonic.  Prominent masking of the face is seen.   Psych--  No anxious or depressed  appearing.          Assessment & Plan:  UTI, Status post antibiotics. Currently asymptomatic, request for her urinary symptoms come back

## 2013-10-10 NOTE — Assessment & Plan Note (Signed)
Seems well controlled, check a BMP, otherwise no change

## 2013-10-10 NOTE — Progress Notes (Signed)
Pre visit review using our clinic review tool, if applicable. No additional management support is needed unless otherwise documented below in the visit note. 

## 2013-10-10 NOTE — Assessment & Plan Note (Signed)
Symptoms well controlled 

## 2013-10-10 NOTE — Patient Instructions (Signed)
Get your blood work before you leave   Next visit is for a physical exam in 6 months , fasting Please make an appointment

## 2013-10-10 NOTE — Assessment & Plan Note (Signed)
Today we discussed Prevnar and she agreed to get one Had a colonoscopy 09-2006, 2 polyps, tubular adenomas.a notation was made that from the next colonoscopy she will need for deep sedation. Discussed possibly referral to GI, risk of more adenoma and benefits of cancer screening but at this point, the patient is not interested in pursuing further colonoscopies.

## 2013-10-11 ENCOUNTER — Telehealth: Payer: Self-pay | Admitting: Internal Medicine

## 2013-10-11 LAB — BASIC METABOLIC PANEL
BUN: 11 mg/dL (ref 6–23)
CALCIUM: 10 mg/dL (ref 8.4–10.5)
CO2: 29 meq/L (ref 19–32)
Chloride: 103 mEq/L (ref 96–112)
Creatinine, Ser: 0.8 mg/dL (ref 0.4–1.2)
GFR: 71.5 mL/min (ref >60.00)
Glucose, Bld: 78 mg/dL (ref 70–99)
Potassium: 4.1 mEq/L (ref 3.5–5.1)
SODIUM: 140 meq/L (ref 135–145)

## 2013-10-11 LAB — TSH: TSH: 1.03 u[IU]/mL (ref 0.35–4.50)

## 2013-10-11 NOTE — Telephone Encounter (Signed)
Relevant patient education assigned to patient using Emmi. ° °

## 2013-10-23 ENCOUNTER — Other Ambulatory Visit: Payer: Self-pay | Admitting: Internal Medicine

## 2013-10-23 DIAGNOSIS — E039 Hypothyroidism, unspecified: Secondary | ICD-10-CM

## 2013-10-23 NOTE — Telephone Encounter (Signed)
Refill for synthroid sent to [facility] in [place]

## 2013-11-05 ENCOUNTER — Other Ambulatory Visit: Payer: Self-pay | Admitting: Internal Medicine

## 2013-11-15 ENCOUNTER — Other Ambulatory Visit: Payer: Self-pay | Admitting: Neurology

## 2013-11-16 ENCOUNTER — Other Ambulatory Visit: Payer: Self-pay | Admitting: Internal Medicine

## 2013-11-16 DIAGNOSIS — I10 Essential (primary) hypertension: Secondary | ICD-10-CM

## 2013-11-19 ENCOUNTER — Other Ambulatory Visit: Payer: Self-pay

## 2013-11-19 MED ORDER — OXYCODONE HCL 15 MG PO TABS: 15.0000 mg | ORAL_TABLET | Freq: Four times a day (QID) | ORAL | Status: DC | PRN

## 2013-11-19 NOTE — Telephone Encounter (Signed)
Refill for losartan sent to [facility]

## 2013-11-19 NOTE — Telephone Encounter (Signed)
Dr Willis is out of the office.  Forwarding request to W[facility] for approval.  

## 2013-11-19 NOTE — Telephone Encounter (Signed)
Pt's Rx was mailed out today.

## 2013-11-22 ENCOUNTER — Other Ambulatory Visit: Payer: Self-pay | Admitting: *Deleted

## 2013-11-22 DIAGNOSIS — I10 Essential (primary) hypertension: Secondary | ICD-10-CM

## 2013-11-22 MED ORDER — LOSARTAN POTASSIUM 100 MG PO TABS: ORAL_TABLET | ORAL | Status: DC

## 2013-12-13 ENCOUNTER — Encounter: Payer: Self-pay | Admitting: Neurology

## 2013-12-21 ENCOUNTER — Other Ambulatory Visit: Payer: Self-pay | Admitting: Neurology

## 2013-12-21 ENCOUNTER — Other Ambulatory Visit: Payer: Self-pay | Admitting: Internal Medicine

## 2013-12-21 MED ORDER — OXYCODONE HCL 15 MG PO TABS: 15.0000 mg | ORAL_TABLET | Freq: Four times a day (QID) | ORAL | Status: DC | PRN

## 2013-12-21 NOTE — Telephone Encounter (Signed)
Dr Willis is out of the office.  Forwarding request to W[facility] for approval.  

## 2013-12-24 NOTE — Telephone Encounter (Signed)
Pt's Rx was mailed out today.

## 2013-12-25 ENCOUNTER — Other Ambulatory Visit: Payer: Self-pay | Admitting: Neurology

## 2013-12-25 NOTE — Telephone Encounter (Signed)
Dr Willis is out of the office.  Forwarding request to W[facility] for approval.  

## 2013-12-26 MED ORDER — OXYCODONE HCL 15 MG PO TABS: 15.0000 mg | ORAL_TABLET | Freq: Four times a day (QID) | ORAL | Status: DC | PRN

## 2014-01-24 DIAGNOSIS — H2589 Other age-related cataract: Secondary | ICD-10-CM | POA: Diagnosis not present

## 2014-01-24 DIAGNOSIS — H02839 Dermatochalasis of unspecified eye, unspecified eyelid: Secondary | ICD-10-CM | POA: Diagnosis not present

## 2014-01-24 DIAGNOSIS — Z961 Presence of intraocular lens: Secondary | ICD-10-CM | POA: Diagnosis not present

## 2014-02-01 ENCOUNTER — Other Ambulatory Visit: Payer: Self-pay | Admitting: Neurology

## 2014-02-01 MED ORDER — OXYCODONE HCL 15 MG PO TABS: 15.0000 mg | ORAL_TABLET | Freq: Four times a day (QID) | ORAL | Status: DC | PRN

## 2014-02-01 NOTE — Telephone Encounter (Signed)
Rx entered, request forwarded to provider for approval

## 2014-02-01 NOTE — Telephone Encounter (Signed)
Patient's husband calling to request that patient's oxycodone refill script be mailed to them, please call and advise.

## 2014-02-04 NOTE — Telephone Encounter (Signed)
Called pt and left message informing her that her Rx was ready to be picked up at the front desk and if she has any other problems, questions or concerns to call the office.  °

## 2014-02-21 ENCOUNTER — Other Ambulatory Visit: Payer: Self-pay | Admitting: Internal Medicine

## 2014-03-01 ENCOUNTER — Ambulatory Visit: Payer: Medicare Other | Admitting: Neurology

## 2014-03-30 ENCOUNTER — Other Ambulatory Visit: Payer: Self-pay | Admitting: Internal Medicine

## 2014-04-04 ENCOUNTER — Other Ambulatory Visit: Payer: Self-pay | Admitting: Internal Medicine

## 2014-04-05 ENCOUNTER — Other Ambulatory Visit: Payer: Self-pay

## 2014-04-05 MED ORDER — POTASSIUM CHLORIDE CRYS ER 20 MEQ PO TBCR: 40.0000 meq | EXTENDED_RELEASE_TABLET | Freq: Two times a day (BID) | ORAL | Status: DC

## 2014-04-12 ENCOUNTER — Encounter: Payer: Medicare Other | Admitting: Internal Medicine

## 2014-04-15 ENCOUNTER — Other Ambulatory Visit: Payer: Self-pay | Admitting: Internal Medicine

## 2014-05-06 ENCOUNTER — Ambulatory Visit (INDEPENDENT_AMBULATORY_CARE_PROVIDER_SITE_OTHER): Payer: Medicare Other | Admitting: Internal Medicine

## 2014-05-06 ENCOUNTER — Ambulatory Visit (INDEPENDENT_AMBULATORY_CARE_PROVIDER_SITE_OTHER): Payer: Medicare Other

## 2014-05-06 ENCOUNTER — Encounter: Payer: Self-pay | Admitting: Internal Medicine

## 2014-05-06 DIAGNOSIS — E034 Atrophy of thyroid (acquired): Secondary | ICD-10-CM

## 2014-05-06 DIAGNOSIS — E038 Other specified hypothyroidism: Secondary | ICD-10-CM

## 2014-05-06 DIAGNOSIS — E785 Hyperlipidemia, unspecified: Secondary | ICD-10-CM | POA: Diagnosis not present

## 2014-05-06 DIAGNOSIS — I10 Essential (primary) hypertension: Secondary | ICD-10-CM

## 2014-05-06 DIAGNOSIS — Z23 Encounter for immunization: Secondary | ICD-10-CM

## 2014-05-06 DIAGNOSIS — M810 Age-related osteoporosis without current pathological fracture: Secondary | ICD-10-CM

## 2014-05-06 DIAGNOSIS — Z0000 Encounter for general adult medical examination without abnormal findings: Secondary | ICD-10-CM

## 2014-05-06 DIAGNOSIS — D649 Anemia, unspecified: Secondary | ICD-10-CM

## 2014-05-06 NOTE — Assessment & Plan Note (Addendum)
Td 05 and today Had a pnm shot  Prevnar 2015 Flu  today zostavax -- discuss on RTC  History of breast cancer, the patient clearly told me she is not interested in further mammograms or Pap smears.  Had a colonoscopy 09-2006, 2 polyps, tubular adenomas, A notation was made that from the next colonoscopy she will need for deep sedation. Before we discussed  possibly referral to GI, risk of more adenoma and benefits of cancer screening but   patient is not interested in pursuing further colonoscopies. We are checking her labs, if she has iron deficiency anemia we'll have to revisit the issue. Patient aware

## 2014-05-06 NOTE — Assessment & Plan Note (Signed)
Continue with losartan, check a BMP

## 2014-05-06 NOTE — Progress Notes (Signed)
Subjective:    Patient ID: Emily Mccullough, female    DOB: 05/17/1946, 68 y.o.   MRN: [id number]  DOS:  05/06/2014 Type of visit - description : here w/ husband to helps w/ exam Here for Medicare AWV:  1. Risk factors based on Past M, S, F history: reviewed 2. Physical Activities:  Very limited at this time  3. Depression/mood: neg screening  4. Hearing:  No problemss noted or reported  5. ADL's:  Able to self care for the most part, husband helps 13. Fall Risk: high, prevention discussed  7. home Safety: does feel safe at home  8. Height, weight, & visual acuity: see VS, saw the eye doctor this year 9. Counseling: provided 10. Labs ordered based on risk factors: if needed  11. Referral Coordination: if needed 12. Care Plan, see assessment and plan  13. Cognitive Assessment: motor skills limited, a ox3 14. Care team updated-- neuro Dr Jannifer Franklin 15. Personalized plan provided,  see instructions  In addition, today we discussed the following: hypertension, good medication compliance, not ambulatory BPs, BP today is very good Hypothyroidism, good medication compliance.   ROS  Has not complained about chest pain, difficulty breathing or lower extremity edema No nausea, vomiting, diarrhea or blood in the stools.    Past Medical History  Diagnosis Date  . PSP (progressive supranuclear palsy) 12/09    neuro Dr.Willis, initially dx as parkinson, dx changed to PSP in 2010  . Depression   . GERD (gastroesophageal reflux disease)   . Osteoporosis   . Hyperlipidemia   . Hypothyroidism   . Hypertension   . Chronic back pain   . Hoarseness   . Colonic polyp   . Postmenopausal status   . SOB (shortness of breath)     s/p a negative Cardio-pulmonary stress test 08-27-04, normal PFT s 2006    . Allergic rhinitis   . Obese     Moderate  . Breast CA 1996  . Melanoma 2006    in situ nose,s/p mohs surgery  . Headache(784.0)     migraines     Past Surgical History  Procedure  Laterality Date  . Mastectomy    . Total abdominal hysterectomy w/ bilateral salpingoophorectomy  1990  . Fracture arm    . Spinal fusion  10/24/07    6 screws  . Cataract extraction      right    History   Social History  . Marital Status: Married    Spouse Name: N/A    Number of Children: 0  . Years of Education: N/A   Occupational History  . disable    Social History Main Topics  . Smoking status: Former Research scientist (life sciences)  . Smokeless tobacco: Never Used  . Alcohol Use: No  . Drug Use: Not on file  . Sexual Activity: Not on file   Other Topics Concern  . Not on file   Social History Narrative   Household-- pt ans husband     Family History  Problem Relation Age of Onset  . Coronary artery disease Father     MI at age 40  . Stroke      GF  . Emphysema Father   . Colon cancer Father     age  . Diabetes Mother   . Hypertension Mother   . Hypertension Father   . Breast cancer Maternal Aunt        Medication List       This list is accurate  as of: 05/06/14 11:59 PM.  Always use your most recent med list.               CALCIUM-VITAMIN D PO  Take by mouth.     fluticasone 50 MCG/ACT nasal spray  Commonly known as:  FLONASE  Place 2 sprays into the nose daily.     furosemide 20 MG tablet  Commonly known as:  LASIX  TAKE 2 TABLETS BY MOUTH DAILY     levothyroxine 100 MCG tablet  Commonly known as:  SYNTHROID, LEVOTHROID  TAKE 1 TABLET BY MOUTH EVERY DAY BEFORE BREAKFAST     losartan 100 MG tablet  Commonly known as:  COZAAR  TAKE 1 TABLET BY MOUTH EVERY DAY     multivitamin capsule  Take 1 capsule by mouth daily.     nortriptyline 25 MG capsule  Commonly known as:  PAMELOR  TAKE 1 CAPSULE BY MOUTH AT BEDTIME     oxyCODONE 15 MG immediate release tablet  Commonly known as:  ROXICODONE  Take 1 tablet (15 mg total) by mouth every 6 (six) hours as needed for pain (Must last 28 days).     pantoprazole 40 MG tablet  Commonly known as:  PROTONIX    TAKE 1 TABLET BY MOUTH TWICE DAILY     potassium chloride SA 20 MEQ tablet  Commonly known as:  K-DUR,KLOR-CON  Take 2 tablets (40 mEq total) by mouth 2 (two) times daily.     PROAIR HFA 108 (90 BASE) MCG/ACT inhaler  Generic drug:  albuterol  INHALE 2 PUFFS INTO THE LUNGS FOUR TIMES DAILY           Objective:   Physical Exam BP 132/68 mmHg  Pulse 89  Temp(Src) 97.5 F (36.4 C) (Oral)  Wt 182 lb (82.555 kg)  SpO2 98% General -- alert, well-developed, NAD.  HEENT-- Not pale.  Lungs -- normal respiratory effort, no intercostal retractions, no accessory muscle use, and normal breath sounds.  Heart-- normal rate, regular rhythm, no murmur.  Abdomen-- Not distended, good bowel sounds,soft, non-tender. Extremities-- no pretibial edema bilaterally  Neurologic--  alert & oriented X3. Speech dysarthric, motor strength symmetrically decreased. Psych--   No anxious or depressed appearing.        Assessment & Plan:

## 2014-05-06 NOTE — Assessment & Plan Note (Signed)
Will check a CBC, iron and ferritin. She does not like to pursue another colonoscopy, if she has severe anemia or iron deficiency we'll have to revisit the issue.

## 2014-05-06 NOTE — Assessment & Plan Note (Signed)
On no medications, labs today

## 2014-05-06 NOTE — Patient Instructions (Signed)
Get your blood work before you leave     Please come back to the office in 6 months  for a routine check up  No  fasting       Fall Prevention and Home Safety Falls cause injuries and can affect all age groups. It is possible to use preventive measures to significantly decrease the likelihood of falls. There are many simple measures which can make your home safer and prevent falls. OUTDOORS  Repair cracks and edges of walkways and driveways.  Remove high doorway thresholds.  Trim shrubbery on the main path into your home.  Have good outside lighting.  Clear walkways of tools, rocks, debris, and clutter.  Check that handrails are not broken and are securely fastened. Both sides of steps should have handrails.  Have leaves, snow, and ice cleared regularly.  Use sand or salt on walkways during winter months.  In the garage, clean up grease or oil spills. BATHROOM  Install night lights.  Install grab bars by the toilet and in the tub and shower.  Use non-skid mats or decals in the tub or shower.  Place a plastic non-slip stool in the shower to sit on, if needed.  Keep floors dry and clean up all water on the floor immediately.  Remove soap buildup in the tub or shower on a regular basis.  Secure bath mats with non-slip, double-sided rug tape.  Remove throw rugs and tripping hazards from the floors. BEDROOMS  Install night lights.  Make sure a bedside light is easy to reach.  Do not use oversized bedding.  Keep a telephone by your bedside.  Have a firm chair with side arms to use for getting dressed.  Remove throw rugs and tripping hazards from the floor. KITCHEN  Keep handles on pots and pans turned toward the center of the stove. Use back burners when possible.  Clean up spills quickly and allow time for drying.  Avoid walking on wet floors.  Avoid hot utensils and knives.  Position shelves so they are not too high or low.  Place commonly used  objects within easy reach.  If necessary, use a sturdy step stool with a grab bar when reaching.  Keep electrical cables out of the way.  Do not use floor polish or wax that makes floors slippery. If you must use wax, use non-skid floor wax.  Remove throw rugs and tripping hazards from the floor. STAIRWAYS  Never leave objects on stairs.  Place handrails on both sides of stairways and use them. Fix any loose handrails. Make sure handrails on both sides of the stairways are as long as the stairs.  Check carpeting to make sure it is firmly attached along stairs. Make repairs to worn or loose carpet promptly.  Avoid placing throw rugs at the top or bottom of stairways, or properly secure the rug with carpet tape to prevent slippage. Get rid of throw rugs, if possible.  Have an electrician put in a light switch at the top and bottom of the stairs. OTHER FALL PREVENTION TIPS  Wear low-heel or rubber-soled shoes that are supportive and fit well. Wear closed toe shoes.  When using a stepladder, make sure it is fully opened and both spreaders are firmly locked. Do not climb a closed stepladder.  Add color or contrast paint or tape to grab bars and handrails in your home. Place contrasting color strips on first and last steps.  Learn and use mobility aids as needed. Install an Dealer  emergency response system.  Turn on lights to avoid dark areas. Replace light bulbs that burn out immediately. Get light switches that glow.  Arrange furniture to create clear pathways. Keep furniture in the same place.  Firmly attach carpet with non-skid or double-sided tape.  Eliminate uneven floor surfaces.  Select a carpet pattern that does not visually hide the edge of steps.  Be aware of all pets. OTHER HOME SAFETY TIPS  Set the water temperature for 120 F (48.8 C).  Keep emergency numbers on or near the telephone.  Keep smoke detectors on every level of the home and near sleeping  areas. Document Released: 04/23/2002 Document Revised: 11/02/2011 Document Reviewed: 07/23/2011 [facility] Patient Information 2015 [place], [place]. This information is not intended to replace advice given to you by your health care provider. Make sure you discuss any questions you have with your health care provider.     Preventive Care for Adults  Ages 93 years and over  Blood pressure check.** / Every 1 to 2 years.  Lipid and cholesterol check.** / Every 5 years beginning at age 88 years.  Lung cancer screening. / Every year if you are aged 63-80 years and have a 30-pack-year history of smoking and currently smoke or have quit within the past 15 years. Yearly screening is stopped once you have quit smoking for at least 15 years or develop a health problem that would prevent you from having lung cancer treatment.  Clinical breast exam.** / Every year after age 107 years.  BRCA-related cancer risk assessment.** / For women who have family members with a BRCA-related cancer (breast, ovarian, tubal, or peritoneal cancers).  Mammogram.** / Every year beginning at age 74 years and continuing for as long as you are in good health. Consult with your health care provider.  Pap test.** / Every 3 years starting at age 24 years through age 80 or 32 years with 3 consecutive normal Pap tests. Testing can be stopped between 65 and 70 years with 3 consecutive normal Pap tests and no abnormal Pap or HPV tests in the past 10 years.  HPV screening.** / Every 3 years from ages 32 years through ages 11 or 63 years with a history of 3 consecutive normal Pap tests. Testing can be stopped between 65 and 70 years with 3 consecutive normal Pap tests and no abnormal Pap or HPV tests in the past 10 years.  Fecal occult blood test (FOBT) of stool. / Every year beginning at age 53 years and continuing until age 65 years. You may not need to do this test if you get a colonoscopy every 10 years.  Flexible sigmoidoscopy  or colonoscopy.** / Every 5 years for a flexible sigmoidoscopy or every 10 years for a colonoscopy beginning at age 47 years and continuing until age 21 years.  Hepatitis C blood test.** / For all people born from [birth year] through 1965 and any individual with known risks for hepatitis C.  Osteoporosis screening.** / A one-time screening for women ages 1 years and over and women at risk for fractures or osteoporosis.  Skin self-exam. / Monthly.  Influenza vaccine. / Every year.  Tetanus, diphtheria, and acellular pertussis (Tdap/Td) vaccine.** / 1 dose of Td every 10 years.  Varicella vaccine.** / Consult your health care provider.  Zoster vaccine.** / 1 dose for adults aged 40 years or older.  Pneumococcal 13-valent conjugate (PCV13) vaccine.** / Consult your health care provider.  Pneumococcal polysaccharide (PPSV23) vaccine.** / 1 dose for all adults  aged 71 years and older.  Meningococcal vaccine.** / Consult your health care provider.  Hepatitis A vaccine.** / Consult your health care provider.  Hepatitis B vaccine.** / Consult your health care provider.  Haemophilus influenzae type b (Hib) vaccine.** / Consult your health care provider. ** Family history and personal history of risk and conditions may change your health care provider's recommendations. Document Released: 06/29/2001 Document Revised: 09/17/2013 Document Reviewed: 09/28/2010 [facility] Patient Information 2015 [place], [place]. This information is not intended to replace advice given to you by your health care provider. Make sure you discuss any questions you have with your health care provider.

## 2014-05-06 NOTE — Progress Notes (Signed)
Pre visit review using our clinic review tool, if applicable. No additional management support is needed unless otherwise documented below in the visit note. 

## 2014-05-06 NOTE — Assessment & Plan Note (Addendum)
Offered to do a bone density test-- declined

## 2014-05-06 NOTE — Assessment & Plan Note (Signed)
Good compliance with Synthroid, check a TSH

## 2014-05-07 LAB — COMPREHENSIVE METABOLIC PANEL
ALBUMIN: 4 g/dL (ref 3.5–5.2)
ALK PHOS: 62 U/L (ref 39–117)
ALT: 26 U/L (ref 0–35)
AST: 25 U/L (ref 0–37)
BILIRUBIN TOTAL: 0.5 mg/dL (ref 0.2–1.2)
BUN: 8 mg/dL (ref 6–23)
CO2: 23 mEq/L (ref 19–32)
Calcium: 9.8 mg/dL (ref 8.4–10.5)
Chloride: 110 mEq/L (ref 96–112)
Creatinine, Ser: 0.7 mg/dL (ref 0.4–1.2)
GFR: 91.09 mL/min (ref >60.00)
Glucose, Bld: 89 mg/dL (ref 70–99)
POTASSIUM: 4.6 meq/L (ref 3.5–5.1)
Sodium: 142 mEq/L (ref 135–145)
Total Protein: 7.2 g/dL (ref 6.0–8.3)

## 2014-05-07 LAB — CBC WITH DIFFERENTIAL/PLATELET
Basophils Absolute: 0.1 10*3/uL (ref 0.0–0.1)
Basophils Relative: 0.7 % (ref 0.0–3.0)
EOS PCT: 0.4 % (ref 0.0–5.0)
Eosinophils Absolute: 0 10*3/uL (ref 0.0–0.7)
HCT: 34.9 % — ABNORMAL LOW (ref 36.0–46.0)
Hemoglobin: 11.3 g/dL — ABNORMAL LOW (ref 12.0–15.0)
Lymphocytes Relative: 27.9 % (ref 12.0–46.0)
Lymphs Abs: 2.5 10*3/uL (ref 0.7–4.0)
MCHC: 32.2 g/dL (ref 30.0–36.0)
MCV: 84.1 fl (ref 78.0–100.0)
MONOS PCT: 7.1 % (ref 3.0–12.0)
Monocytes Absolute: 0.6 10*3/uL (ref 0.1–1.0)
NEUTROS PCT: 63.9 % (ref 43.0–77.0)
Neutro Abs: 5.7 10*3/uL (ref 1.4–7.7)
PLATELETS: 353 10*3/uL (ref 150.0–400.0)
RBC: 4.16 Mil/uL (ref 3.87–5.11)
RDW: 16.6 % — ABNORMAL HIGH (ref 11.5–15.5)
WBC: 8.9 10*3/uL (ref 4.0–10.5)

## 2014-05-07 LAB — LIPID PANEL
CHOL/HDL RATIO: 5
CHOLESTEROL: 231 mg/dL — AB (ref 0–200)
HDL: 47.3 mg/dL (ref >39.00)
NONHDL: 183.7
TRIGLYCERIDES: 300 mg/dL — AB (ref 0.0–149.0)
VLDL: 60 mg/dL — AB (ref 0.0–40.0)

## 2014-05-07 LAB — TSH: TSH: 0.04 u[IU]/mL — ABNORMAL LOW (ref 0.35–4.50)

## 2014-05-07 LAB — FERRITIN: FERRITIN: 16.2 ng/mL (ref 10.0–291.0)

## 2014-05-07 LAB — LDL CHOLESTEROL, DIRECT: Direct LDL: 136.1 mg/dL

## 2014-05-07 LAB — IRON: Iron: 75 ug/dL (ref 42–145)

## 2014-05-09 MED ORDER — LEVOTHYROXINE SODIUM 75 MCG PO TABS: 75.0000 ug | ORAL_TABLET | Freq: Every day | ORAL | Status: DC

## 2014-05-09 NOTE — Addendum Note (Signed)
Addended by: Wilfrid Lund on: 05/09/2014 12:22 PM   Modules accepted: Orders, Medications

## 2014-05-15 ENCOUNTER — Encounter: Payer: Self-pay | Admitting: Neurology

## 2014-05-15 ENCOUNTER — Ambulatory Visit (INDEPENDENT_AMBULATORY_CARE_PROVIDER_SITE_OTHER): Payer: Medicare Other | Admitting: Neurology

## 2014-05-15 VITALS — BP 147/83 | HR 90 | Ht 66.0 in | Wt 181.0 lb

## 2014-05-15 DIAGNOSIS — R269 Unspecified abnormalities of gait and mobility: Secondary | ICD-10-CM | POA: Diagnosis not present

## 2014-05-15 DIAGNOSIS — G231 Progressive supranuclear ophthalmoplegia [Steele-Richardson-Olszewski]: Secondary | ICD-10-CM | POA: Diagnosis not present

## 2014-05-15 MED ORDER — OXYCODONE HCL 15 MG PO TABS: 15.0000 mg | ORAL_TABLET | Freq: Four times a day (QID) | ORAL | Status: DC | PRN

## 2014-05-15 NOTE — Patient Instructions (Signed)
Progressive Supranuclear Palsy Progressive supranuclear palsy is a rare brain disorder that causes a gradual deterioration of cells at the base of the brain. It causes serious problems with walking, eye movement, and balance. It may also cause behavior changes, depression, and loss of mental capacity (dementia).  Progressive supranuclear palsy tends to get worse over time.  CAUSES The exact cause of progressive supranuclear palsy is not known. In some cases, the condition may be caused by genes passed down through families (inherited). RISK FACTORS Progressive supranuclear palsy usually begins after age 71 and is slightly more common in men. SIGNS AND SYMPTOMS  The pattern of signs and symptoms can vary greatly from person to person. Signs and symptoms may include:  Loss of balance while walking.  Walking that is stiff and awkward.  Unexplained falls.  Inability to focus the eyes properly or blurred vision.  Changes in mood and behavior. These include:  Lack of feeling or emotion (apathy).  Irritability.  Sudden laughing or crying.  Personality changes.  Progressive mild dementia.  Difficulty swallowing and speaking. DIAGNOSIS  Your health care provider can diagnose progressive supranuclear palsy from your signs and symptoms. The main symptoms your health care provider will check for are:  Poor balance.  Vision problems.  Slurred speech.  Mental or behavioral changes. Your health care provider may also do some tests to rule out other causes of your symptoms. This may include checking your spinal fluid for abnormal proteins and taking images of your brain to look for areas of nerve cell loss. TREATMENT There is no cure for progressive supranuclear palsy. No treatment will slow the progression of the disease. Your treatment will be based on your symptoms. Treatment may include:  Medicines for Parkinson's disease. These may help with slowness, stiffness, and balance  problems.  Antidepressant medicines to treat depression.  Glasses called prisms to help with blurry vision.  Walking aids to prevent falls.  A surgical procedure to place a feeding tube into your stomach (gastrostomy) if swallowing becomes very hard. HOME CARE INSTRUCTIONS Work closely with your team of health care providers. Your team may include:  A physical therapist. Have him or her help you come up with a safe exercise program.  A speech and language therapist. Have him or her teach you ways to swallow foods and liquids safely. This specialist can also help you speak more clearly.  An occupational therapist. Have him or her help you learn to use walking aids and make your home safe. SEEK MEDICAL CARE IF:  You have chills or fever.  Your symptoms are getting worse.  You develop new symptoms.  You are having trouble getting enough nutrition.  You have a cough that will not go away.  You have shortness of breath.  You are anxious or depressed.  You are not getting enough support at home. SEEK IMMEDIATE MEDICAL CARE IF:  You choke, cough, or have trouble breathing after eating or drinking.  You hurt yourself in a fall.  You no longer feel safe at home. Document Released: 04/23/2002 Document Revised: 09/17/2013 Document Reviewed: 05/02/2013 [facility] Patient Information 2015 [place], [place]. This information is not intended to replace advice given to you by your health care provider. Make sure you discuss any questions you have with your health care provider.

## 2014-05-15 NOTE — Progress Notes (Signed)
Reason for visit: Progressive supranuclear palsy  Emily HACKWORTH is an 68 y.o. female  History of present illness:  Emily Mccullough is a 68 year old right-handed white female with a history of progressive supranuclear palsy. She continues to progress slowly over the years, and she is gradually getting to the point where she is unable to speak. The patient denies any problems with swallowing surprisingly. She is still able to read a newspaper or a book. She uses a typing board for communication. She has occasional falls when she tries to go from the bedside commode to the bed. She has not injured herself recently. She does have some chronic low back pain, and she has been out of her oxycodone for one month. The pain has been more severe during this period of time. The patient denies any other new medical issues that have come up since last seen.  Past Medical History  Diagnosis Date  . PSP (progressive supranuclear palsy) 12/09    neuro Dr.Willis, initially dx as parkinson, dx changed to PSP in 2010  . Depression   . GERD (gastroesophageal reflux disease)   . Osteoporosis   . Hyperlipidemia   . Hypothyroidism   . Hypertension   . Chronic back pain   . Hoarseness   . Colonic polyp   . Postmenopausal status   . SOB (shortness of breath)     s/p a negative Cardio-pulmonary stress test 08-27-04, normal PFT s 2006    . Allergic rhinitis   . Obese     Moderate  . Breast CA 1996  . Melanoma 2006    in situ nose,s/p mohs surgery  . Headache(784.0)     migraines     Past Surgical History  Procedure Laterality Date  . Mastectomy    . Total abdominal hysterectomy w/ bilateral salpingoophorectomy  1990  . Fracture arm    . Spinal fusion  10/24/07    6 screws  . Cataract extraction      right    Family History  Problem Relation Age of Onset  . Coronary artery disease Father     MI at age 63  . Emphysema Father   . Colon cancer Father     age  . Hypertension Father   . Stroke       GF  . Diabetes Mother   . Hypertension Mother   . Breast cancer Maternal Aunt     Social history:  reports that she has quit smoking. She has never used smokeless tobacco. She reports that she does not drink alcohol. Her drug history is not on file.    Allergies  Allergen Reactions  . Neosporin [Neomycin-Bacitracin Zn-Polymyx] Rash    Medications:  Current Outpatient Prescriptions on File Prior to Visit  Medication Sig Dispense Refill  . CALCIUM-VITAMIN D PO Take by mouth.      . furosemide (LASIX) 20 MG tablet TAKE 2 TABLETS BY MOUTH DAILY 180 tablet 1  . levothyroxine (SYNTHROID, LEVOTHROID) 75 MCG tablet Take 1 tablet (75 mcg total) by mouth daily before breakfast. 30 tablet 1  . losartan (COZAAR) 100 MG tablet TAKE 1 TABLET BY MOUTH EVERY DAY 90 tablet 1  . Multiple Vitamin (MULTIVITAMIN) capsule Take 1 capsule by mouth daily.      . nortriptyline (PAMELOR) 25 MG capsule TAKE 1 CAPSULE BY MOUTH AT BEDTIME 180 capsule 0  . pantoprazole (PROTONIX) 40 MG tablet TAKE 1 TABLET BY MOUTH TWICE DAILY 60 tablet 3  . potassium chloride  SA (K-DUR,KLOR-CON) 20 MEQ tablet Take 2 tablets (40 mEq total) by mouth 2 (two) times daily. 120 tablet 3   No current facility-administered medications on file prior to visit.    ROS:  Out of a complete 14 system review of symptoms, the patient complains only of the following symptoms, and all other reviewed systems are negative.  Drooling Back pain Bruising easily Speech problems  Blood pressure 147/83, pulse 90, height 5\' 6"  (1.676 m), weight 181 lb (82.101 kg).  Physical Exam  General: The patient is alert and cooperative at the time of the examination.  Skin: No significant peripheral edema is noted.   Neurologic Exam  Mental status: The patient is oriented x 3.  Cranial nerves: Facial symmetry is present, but the patient has masking of the face. She is slowly able to sig her tongue out and move it side-to-side. Speech is  dysphonic, whispery, low amplitude. Extraocular movements are full. Visual fields are full.  Motor: The patient has good strength in all 4 extremities. Increased motor tone is noted throughout.  Sensory examination: Soft touch sensation is symmetric on the face, arms, and legs.  Coordination: The patient has good finger-nose-finger and heel-to-shin bilaterally.  Gait and station: The patient is essentially nonambulatory, she is wheelchair-bound.  Reflexes: Deep tendon reflexes are symmetric.   Assessment/Plan:  1. Progressive supranuclear palsy  2. Chronic low back pain  3. Gait disorder  The patient is continuing to worsen with her ability to speak. She surprisingly indicates that she is not having any problems with swallowing. She will need to pay close attention to this, and contact me if this becomes an issue. She otherwise will follow-up in 6 months. A prescription was given for the oxycodone.  Jill Alexanders MD 05/15/2014 7:02 PM  [facility] [address] [place], [place] [address]  Phone [phone] Fax [phone]

## 2014-06-14 ENCOUNTER — Other Ambulatory Visit: Payer: Self-pay | Admitting: Internal Medicine

## 2014-06-18 ENCOUNTER — Other Ambulatory Visit: Payer: Self-pay | Admitting: Neurology

## 2014-06-18 MED ORDER — OXYCODONE HCL 15 MG PO TABS: 15.0000 mg | ORAL_TABLET | Freq: Four times a day (QID) | ORAL | Status: DC | PRN

## 2014-06-18 NOTE — Telephone Encounter (Signed)
Request entered, forwarded to provider for approval.  

## 2014-06-18 NOTE — Telephone Encounter (Signed)
Patient's husband is calling for a written Rx for Oxycodone.  Please call.

## 2014-06-19 ENCOUNTER — Telehealth: Payer: Self-pay

## 2014-06-19 NOTE — Telephone Encounter (Signed)
Called patient, spoke to spouse and informed Rx ready for pick up at front desk. Patient verbalized understanding.

## 2014-06-27 ENCOUNTER — Other Ambulatory Visit: Payer: Self-pay | Admitting: Internal Medicine

## 2014-07-01 ENCOUNTER — Other Ambulatory Visit: Payer: Self-pay | Admitting: Internal Medicine

## 2014-07-10 ENCOUNTER — Other Ambulatory Visit (INDEPENDENT_AMBULATORY_CARE_PROVIDER_SITE_OTHER): Payer: Medicare Other

## 2014-07-10 DIAGNOSIS — E034 Atrophy of thyroid (acquired): Secondary | ICD-10-CM | POA: Diagnosis not present

## 2014-07-10 DIAGNOSIS — E038 Other specified hypothyroidism: Secondary | ICD-10-CM | POA: Diagnosis not present

## 2014-07-10 LAB — TSH: TSH: 0.91 u[IU]/mL (ref 0.35–4.50)

## 2014-07-17 ENCOUNTER — Encounter: Payer: Self-pay | Admitting: Internal Medicine

## 2014-07-23 ENCOUNTER — Other Ambulatory Visit: Payer: Self-pay | Admitting: Neurology

## 2014-07-23 MED ORDER — OXYCODONE HCL 15 MG PO TABS: 15.0000 mg | ORAL_TABLET | Freq: Four times a day (QID) | ORAL | Status: DC | PRN

## 2014-07-23 NOTE — Telephone Encounter (Signed)
Spouse requesting refill for Rx oxyCODONE (ROXICODONE) 15 MG immediate release tablet.  Please call when ready for pick up.

## 2014-07-25 ENCOUNTER — Telehealth: Payer: Self-pay

## 2014-07-25 NOTE — Telephone Encounter (Signed)
Patient husband called and will pick Rx up either today or tomorrow. He is aware the office closes at 12 tomorrow.

## 2014-07-25 NOTE — Telephone Encounter (Signed)
Called patient to inform Rx ready for pick up at front desk. No answer on hp#. Mobile number answers then "dead air".

## 2014-08-07 ENCOUNTER — Ambulatory Visit (INDEPENDENT_AMBULATORY_CARE_PROVIDER_SITE_OTHER): Payer: Medicare Other | Admitting: Physician Assistant

## 2014-08-07 VITALS — BP 146/69 | HR 84 | Temp 98.4°F | Resp 18 | Ht 66.0 in

## 2014-08-07 DIAGNOSIS — M5432 Sciatica, left side: Secondary | ICD-10-CM | POA: Diagnosis not present

## 2014-08-07 MED ORDER — METHYLPREDNISOLONE (PAK) 4 MG PO TABS: ORAL_TABLET | ORAL | Status: DC

## 2014-08-07 NOTE — Patient Instructions (Addendum)
I believe that you have aggravated your sciatic nerve when you fell getting into bed. I am going to prescribe a Medrol Dose Pack for you to take which will hopefully calm down that sciatic nerve. Continue to take your prescribed oxycodone and alternate tylenol and motrin. Also try and exerice the left leg as much as possible. If the pain is not improving in the next three days or is getting worse, please follow up with [place]. Also, please be careful not to fall anymore. I hope you feel better soon.    Sciatica Sciatica is pain, weakness, numbness, or tingling along the path of the sciatic nerve. The nerve starts in the lower back and runs down the back of each leg. The nerve controls the muscles in the lower leg and in the back of the knee, while also providing sensation to the back of the thigh, lower leg, and the sole of your foot. Sciatica is a symptom of another medical condition. For instance, nerve damage or certain conditions, such as a herniated disk or bone spur on the spine, pinch or put pressure on the sciatic nerve. This causes the pain, weakness, or other sensations normally associated with sciatica. Generally, sciatica only affects one side of the body. CAUSES   Herniated or slipped disc.  Degenerative disk disease.  A pain disorder involving the narrow muscle in the buttocks (piriformis syndrome).  Pelvic injury or fracture.  Pregnancy.  Tumor (rare). SYMPTOMS  Symptoms can vary from mild to very severe. The symptoms usually travel from the low back to the buttocks and down the back of the leg. Symptoms can include:  Mild tingling or dull aches in the lower back, leg, or hip.  Numbness in the back of the calf or sole of the foot.  Burning sensations in the lower back, leg, or hip.  Sharp pains in the lower back, leg, or hip.  Leg weakness.  Severe back pain inhibiting movement. These symptoms may get worse with coughing, sneezing, laughing, or prolonged sitting or  standing. Also, being overweight may worsen symptoms. DIAGNOSIS  Your caregiver will perform a physical exam to look for common symptoms of sciatica. He or she may ask you to do certain movements or activities that would trigger sciatic nerve pain. Other tests may be performed to find the cause of the sciatica. These may include:  Blood tests.  X-rays.  Imaging tests, such as an MRI or CT scan. TREATMENT  Treatment is directed at the cause of the sciatic pain. Sometimes, treatment is not necessary and the pain and discomfort goes away on its own. If treatment is needed, your caregiver may suggest:  Over-the-counter medicines to relieve pain.  Prescription medicines, such as anti-inflammatory medicine, muscle relaxants, or narcotics.  Applying heat or ice to the painful area.  Steroid injections to lessen pain, irritation, and inflammation around the nerve.  Reducing activity during periods of pain.  Exercising and stretching to strengthen your abdomen and improve flexibility of your spine. Your caregiver may suggest losing weight if the extra weight makes the back pain worse.  Physical therapy.  Surgery to eliminate what is pressing or pinching the nerve, such as a bone spur or part of a herniated disk. HOME CARE INSTRUCTIONS   Only take over-the-counter or prescription medicines for pain or discomfort as directed by your caregiver.  Apply ice to the affected area for 20 minutes, 3-4 times a day for the first 48-72 hours. Then try heat in the same way.  Exercise, stretch, or perform your usual activities if these do not aggravate your pain.  Attend physical therapy sessions as directed by your caregiver.  Keep all follow-up appointments as directed by your caregiver.  Do not wear high heels or shoes that do not provide proper support.  Check your mattress to see if it is too soft. A firm mattress may lessen your pain and discomfort. SEEK IMMEDIATE MEDICAL CARE IF:   You  lose control of your bowel or bladder (incontinence).  You have increasing weakness in the lower back, pelvis, buttocks, or legs.  You have redness or swelling of your back.  You have a burning sensation when you urinate.  You have pain that gets worse when you lie down or awakens you at night.  Your pain is worse than you have experienced in the past.  Your pain is lasting longer than 4 weeks.  You are suddenly losing weight without reason. MAKE SURE YOU:  Understand these instructions.  Will watch your condition.  Will get help right away if you are not doing well or get worse. Document Released: 04/27/2001 Document Revised: 11/02/2011 Document Reviewed: 09/12/2011 [facility] Patient Information 2015 [place], [place]. This information is not intended to replace advice given to you by your health care provider. Make sure you discuss any questions you have with your health care provider.

## 2014-08-07 NOTE — Progress Notes (Signed)
Pre visit review using our clinic review tool, if applicable. No additional management support is needed unless otherwise documented below in the visit note/SLS  

## 2014-08-07 NOTE — Progress Notes (Signed)
   Subjective:    Patient ID: Emily Mccullough, female    DOB: [birth year], 69 y.o.   MRN: [id number]  HPI  Patient presents to the office with her brother with the complaint of two weeks of constant (burning) pain in her left leg. Per patient it hurts in her groin when she picks up or moves her left leg, this pain then travels down the front of the leg. When she is sitting the pain is in her butt and runs down the side of her leg. Patient endorses that this pain feels like her sciatica. She also endorses falling two weeks ago while getting into bed. She landed on her butt and the pain started after that. The pain has not improved or been worse. Has been taking her prescribed oxycodone and alternating tylenol and motrin. She does endorse intermittent numbness and tingling in her toes. Denies urinary or fecal incontinence.   Review of Systems  ROS is limited due to patient's medical condition. She is not able to get out of wheelchair. Seel HPI    Objective:   Physical Exam  Constitutional: She is oriented to person, place, and time. She appears well-nourished. No distress.  Musculoskeletal: She exhibits tenderness.  Pain in left leg, hip and buttocks. No bruising noted.   Neurological: She is alert and oriented to person, place, and time.  Unable to speak, types on Ipad. Unable to get out of wheelchair. Does very limited standing at home.   Skin: Skin is warm and dry.          Assessment & Plan:  Sciatica - Start Medrol Dose pack - Continue to alternate tylenol and motrin - Continue to take prescribed oxycodone - Try and exercise/stretch left leg - Cautioned patient about falls

## 2014-08-20 ENCOUNTER — Other Ambulatory Visit: Payer: Self-pay | Admitting: Internal Medicine

## 2014-08-21 ENCOUNTER — Other Ambulatory Visit: Payer: Self-pay | Admitting: Internal Medicine

## 2014-08-22 ENCOUNTER — Telehealth: Payer: Self-pay

## 2014-08-22 NOTE — Telephone Encounter (Signed)
Appointment scheduled for 12/05/14 at 1200.

## 2014-08-22 NOTE — Telephone Encounter (Signed)
Left voicemail asking patient to call back to reschedule July appointment due to Dr. Jannifer Franklin being out of the office that day. Patient and spouse can be scheduled for 0800 11/21/14 or 11/29/14.

## 2014-08-28 ENCOUNTER — Other Ambulatory Visit: Payer: Self-pay | Admitting: Neurology

## 2014-08-28 MED ORDER — OXYCODONE HCL 15 MG PO TABS: 15.0000 mg | ORAL_TABLET | Freq: Four times a day (QID) | ORAL | Status: DC | PRN

## 2014-08-28 NOTE — Telephone Encounter (Signed)
Request entered, forwarded to provider for approval.  

## 2014-08-28 NOTE — Telephone Encounter (Signed)
Pt's spouse is calling requesting a written Rx for oxyCODONE (ROXICODONE) 15 MG immediate release tablet. Please call when ready for pick up.

## 2014-08-29 ENCOUNTER — Telehealth: Payer: Self-pay

## 2014-08-29 NOTE — Telephone Encounter (Signed)
I spoke to patient's husband, Glennon Mac, and informed him that patient has a prescription ready to be picked up at the front desk with photo id.

## 2014-09-06 ENCOUNTER — Ambulatory Visit: Payer: PRIVATE HEALTH INSURANCE | Admitting: Internal Medicine

## 2014-09-18 ENCOUNTER — Encounter: Payer: Self-pay | Admitting: Internal Medicine

## 2014-09-18 ENCOUNTER — Ambulatory Visit (INDEPENDENT_AMBULATORY_CARE_PROVIDER_SITE_OTHER): Payer: Medicare Other | Admitting: Internal Medicine

## 2014-09-18 VITALS — BP 124/68 | HR 73 | Temp 98.0°F | Ht 66.0 in | Wt 180.0 lb

## 2014-09-18 DIAGNOSIS — E034 Atrophy of thyroid (acquired): Secondary | ICD-10-CM | POA: Diagnosis not present

## 2014-09-18 DIAGNOSIS — G231 Progressive supranuclear ophthalmoplegia [Steele-Richardson-Olszewski]: Secondary | ICD-10-CM

## 2014-09-18 DIAGNOSIS — I10 Essential (primary) hypertension: Secondary | ICD-10-CM

## 2014-09-18 DIAGNOSIS — E038 Other specified hypothyroidism: Secondary | ICD-10-CM

## 2014-09-18 MED ORDER — LOSARTAN POTASSIUM 100 MG PO TABS: 100.0000 mg | ORAL_TABLET | Freq: Every day | ORAL | Status: DC

## 2014-09-18 MED ORDER — FUROSEMIDE 20 MG PO TABS: 40.0000 mg | ORAL_TABLET | Freq: Every day | ORAL | Status: DC

## 2014-09-18 MED ORDER — POTASSIUM CHLORIDE CRYS ER 20 MEQ PO TBCR: 40.0000 meq | EXTENDED_RELEASE_TABLET | Freq: Two times a day (BID) | ORAL | Status: DC

## 2014-09-18 MED ORDER — NORTRIPTYLINE HCL 25 MG PO CAPS: 25.0000 mg | ORAL_CAPSULE | Freq: Every day | ORAL | Status: DC

## 2014-09-18 MED ORDER — PANTOPRAZOLE SODIUM 40 MG PO TBEC: 40.0000 mg | DELAYED_RELEASE_TABLET | Freq: Two times a day (BID) | ORAL | Status: DC

## 2014-09-18 MED ORDER — LEVOTHYROXINE SODIUM 75 MCG PO TABS: 75.0000 ug | ORAL_TABLET | Freq: Every day | ORAL | Status: DC

## 2014-09-18 NOTE — Progress Notes (Addendum)
Subjective:    Patient ID: Emily Mccullough, female    DOB: 06-05-1945, 69 y.o.   MRN: [id number]  DOS:  09/18/2014 Type of visit - description : rov, here for a mobility examination. Interval history:  The patient is 69 years old, she has progressive supranuclear palsy, she is gradually deteriorating. At this point, she does not have edema, her vital signs are stable and the O2 saturation is satisfactory. She does report pain in the back for which she is taking painkillers. Denies chest pain or difficulty breathing. For a while she has been unable to ambulate but is able to transfer safely with the help of her husband. No recent falls. She is really willing and motivated to use her power mobility device and in my opinion is safe for her to do so..   A cane or walker are not appropriate because she does not have the strength  flexibility to use them safely. A manual wheelchair will not work for her as she does not have the strength to push herself and her husband is also weak. Her generalized lack of flexibility and strength make her posture difficult consequently she will be unable to safely use a scooter.     Review of Systems Meds reviewed, good compliance, multiple refills needed today Labs reviewed, not due for any lab  Past Medical History  Diagnosis Date  . PSP (progressive supranuclear palsy) 12/09    neuro Dr.Willis, initially dx as parkinson, dx changed to PSP in 2010  . Depression   . GERD (gastroesophageal reflux disease)   . Osteoporosis   . Hyperlipidemia   . Hypothyroidism   . Hypertension   . Chronic back pain   . Hoarseness   . Colonic polyp   . Postmenopausal status   . SOB (shortness of breath)     s/p a negative Cardio-pulmonary stress test 08-27-04, normal PFT s 2006    . Allergic rhinitis   . Obese     Moderate  . Breast CA 1996  . Melanoma 2006    in situ nose,s/p mohs surgery  . Headache(784.0)     migraines     Past Surgical History    Procedure Laterality Date  . Mastectomy    . Total abdominal hysterectomy w/ bilateral salpingoophorectomy  1990  . Fracture arm    . Spinal fusion  10/24/07    6 screws  . Cataract extraction      right    History   Social History  . Marital Status: Married    Spouse Name: N/A  . Number of Children: 0  . Years of Education: N/A   Occupational History  . disable    Social History Main Topics  . Smoking status: Former Research scientist (life sciences)  . Smokeless tobacco: Never Used  . Alcohol Use: No  . Drug Use: Not on file  . Sexual Activity: Not on file   Other Topics Concern  . Not on file   Social History Narrative   Household-- pt ans husband   Patient is right handed   Patient drinks some some caffeine weekly.        Medication List       This list is accurate as of: 09/18/14  6:33 PM.  Always use your most recent med list.               acetaminophen 500 MG tablet  Commonly known as:  TYLENOL  Take 500 mg by mouth every 6 (six) hours  as needed for moderate pain.     CALCIUM-VITAMIN D PO  Take 1 tablet by mouth daily.     furosemide 20 MG tablet  Commonly known as:  LASIX  Take 2 tablets (40 mg total) by mouth daily.     ibuprofen 200 MG tablet  Commonly known as:  ADVIL,MOTRIN  Take 200 mg by mouth every 6 (six) hours as needed for moderate pain.     levothyroxine 75 MCG tablet  Commonly known as:  SYNTHROID, LEVOTHROID  Take 1 tablet (75 mcg total) by mouth daily before breakfast.     losartan 100 MG tablet  Commonly known as:  COZAAR  Take 1 tablet (100 mg total) by mouth daily.     multivitamin capsule  Take 1 capsule by mouth daily.     nortriptyline 25 MG capsule  Commonly known as:  PAMELOR  Take 1 capsule (25 mg total) by mouth at bedtime.     oxyCODONE 15 MG immediate release tablet  Commonly known as:  ROXICODONE  Take 1 tablet (15 mg total) by mouth every 6 (six) hours as needed for pain (Must last 28 days).     pantoprazole 40 MG tablet   Commonly known as:  PROTONIX  Take 1 tablet (40 mg total) by mouth 2 (two) times daily.     potassium chloride SA 20 MEQ tablet  Commonly known as:  K-DUR,KLOR-CON  Take 2 tablets (40 mEq total) by mouth 2 (two) times daily.           Objective:   Physical Exam BP 124/68 mmHg  Pulse 73  Temp(Src) 98 F (36.7 C) (Oral)  Ht 5\' 6"  (1.676 m)  Wt 180 lb (81.647 kg)  BMI 29.07 kg/m2  SpO2 99%  General:   Well developed, well nourished .  Lungs:  CTA B Normal respiratory effort, no intercostal retractions, no accessory muscle use. Heart: RRR,  no murmur.  No pretibial edema bilaterally  Skin: Not pale. Not jaundice Neurologic:  Mental status: The patient is oriented x 3. Cranial nerves: Facial symmetry is present, but the patient has masking of the face.  Speech is dysphonic, whispery, low amplitude.   Motor:  Strength is decreased but symmetric (~3/5, tone increased), very slow moving her extremities. Increased motor tone is noted throughout. Gait and station: The patient is essentially nonambulatory, I did not attempt a gait trial Psych--  No anxious or depressed appearing.       Assessment & Plan:    Mobility examination: The patient definitely qualify for a power mobility device due to PSP. Rx and OV note will be sent Addendum: Due to the immobility impairment, the patient has difficult time moving around the house and transferring. The personal mobility device will help her go to the bathroom, kitchen. Her upper and lower extremities motor strength is decrease with increased tone. She also has back pain, takes pain medication, without pain medication the pain is as high as 8/10. In my opinion the patient can safely operate PMD  Hypothyroidism, refill medications  Hypertension, refill medication, BP well-controlled today  Next visit 3 months  Today , I spent more than 25   min with the patient: >50% of the time  coordinating her care

## 2014-09-18 NOTE — Patient Instructions (Signed)
Come back to the office in 3 months   for a routine check up

## 2014-09-18 NOTE — Progress Notes (Signed)
Pre visit review using our clinic review tool, if applicable. No additional management support is needed unless otherwise documented below in the visit note. 

## 2014-09-18 NOTE — Assessment & Plan Note (Addendum)
Gradually deteriorating over the years, mobility exam performed today, she quite excited about a mobility device.

## 2014-09-19 ENCOUNTER — Other Ambulatory Visit: Payer: Self-pay | Admitting: *Deleted

## 2014-09-19 DIAGNOSIS — G231 Progressive supranuclear ophthalmoplegia [Steele-Richardson-Olszewski]: Secondary | ICD-10-CM

## 2014-09-23 ENCOUNTER — Other Ambulatory Visit: Payer: Self-pay | Admitting: Internal Medicine

## 2014-09-26 ENCOUNTER — Telehealth: Payer: Self-pay | Admitting: *Deleted

## 2014-09-26 NOTE — Telephone Encounter (Signed)
Faxed OV note with that section starred back to [place] at [facility]. JG//CMA

## 2014-09-26 NOTE — Telephone Encounter (Signed)
It is there, see under  physical exam:   Motor: Strength is decreased but symmetric (~3/5, tone increased), very slow moving her extremities. Increased motor tone is noted throughout.

## 2014-09-26 NOTE — Telephone Encounter (Signed)
Received a phone call from Marianna at [facility]. She stated the only thing missing from the OV note is the strength of pt's upper and lower extremities. She states once the OV note reflects this information, they can send the power chair to the patient.

## 2014-09-26 NOTE — Telephone Encounter (Signed)
Dr. Larose Kells adding information to last office note concerning [facility]. Updated notes faxed to [facility], Attn: Joelyn Oms at [phone] successfully. JG//CMA

## 2014-09-26 NOTE — Telephone Encounter (Signed)
Production description form was faxed at 3:23 to fax [phone] and at the  bottom of form its says "Final Step" which is requesting MD signature. Please fax to [phone] telephone [phone] Reference [phone]

## 2014-09-27 NOTE — Telephone Encounter (Signed)
[facility] is re faxing to [phone] Attention: Murtis Sink

## 2014-09-27 NOTE — Telephone Encounter (Signed)
This form has not been received as of 7:28 am today.

## 2014-09-30 ENCOUNTER — Other Ambulatory Visit: Payer: Self-pay | Admitting: Neurology

## 2014-09-30 MED ORDER — OXYCODONE HCL 15 MG PO TABS: 15.0000 mg | ORAL_TABLET | Freq: Four times a day (QID) | ORAL | Status: DC | PRN

## 2014-09-30 NOTE — Telephone Encounter (Signed)
Form received, signed by Dr. Larose Kells and faxed back to [facility] successfully. Sent for scanning. JG//CMA

## 2014-09-30 NOTE — Telephone Encounter (Signed)
Glennon Mac, pt's husband called requesting a refill for oxyCODONE (ROXICODONE) 15 MG immediate release tablet. Patient would like to pick the script up around 11AM on Tuesday 10/01/14 if possible since he will be around the area. Patient was advised the script will be ready for pick up in 24hrs unless he hears otherwise by the nurse. Glennon Mac can be reached @ [phone]

## 2014-09-30 NOTE — Telephone Encounter (Signed)
Request entered, forwarded to [facility] for approval because Dr Jannifer Franklin is out of the office.

## 2014-09-30 NOTE — Telephone Encounter (Signed)
Melita from [facility] calling in wanting to know if received a detailed product prescription. This was sent out after the 1st paperwork.  [phone]

## 2014-10-01 ENCOUNTER — Telehealth: Payer: Self-pay

## 2014-10-01 NOTE — Telephone Encounter (Signed)
Caller: Baldo Ash @ [facility] Ph#: [phone] Ref Id#: [id number] Faxed paperwork stating "final step" in bold letters at bottom of form. She states it was faxed in 09/30/14 at 2:50pm. Please notify it is received and please return to her.

## 2014-10-01 NOTE — Telephone Encounter (Signed)
We have faxed all of the paperwork back to [facility] that we have received. I tried returning call to [facility], but their office doesn't open until 8. I will call back at they time to ensure they have everything they need. JG//CMA

## 2014-10-01 NOTE — Telephone Encounter (Signed)
Please read previous phone note, this form has already been faxed yesterday and I have the fax confirmation.

## 2014-10-01 NOTE — Telephone Encounter (Signed)
Rx ready for pick up. 

## 2014-10-30 ENCOUNTER — Other Ambulatory Visit: Payer: Self-pay | Admitting: Neurology

## 2014-10-30 MED ORDER — OXYCODONE HCL 15 MG PO TABS: 15.0000 mg | ORAL_TABLET | Freq: Four times a day (QID) | ORAL | Status: DC | PRN

## 2014-10-30 NOTE — Telephone Encounter (Signed)
Request entered, forwarded to provider for approval.  

## 2014-10-30 NOTE — Telephone Encounter (Signed)
Patient's husband is calling for his wife's written Rx oxycodone 15 mg. Thanks!

## 2014-10-31 ENCOUNTER — Telehealth: Payer: Self-pay

## 2014-10-31 NOTE — Telephone Encounter (Signed)
Rx ready for pick up. 

## 2014-11-04 ENCOUNTER — Ambulatory Visit: Payer: Medicare Other | Admitting: Internal Medicine

## 2014-11-13 ENCOUNTER — Telehealth: Payer: Self-pay | Admitting: *Deleted

## 2014-11-13 NOTE — Telephone Encounter (Signed)
LMOVM regarding pts mammogram.

## 2014-11-19 ENCOUNTER — Ambulatory Visit: Payer: Medicare Other | Admitting: Neurology

## 2014-12-04 ENCOUNTER — Other Ambulatory Visit: Payer: Self-pay | Admitting: Neurology

## 2014-12-04 MED ORDER — OXYCODONE HCL 15 MG PO TABS: 15.0000 mg | ORAL_TABLET | Freq: Four times a day (QID) | ORAL | Status: DC | PRN

## 2014-12-04 NOTE — Telephone Encounter (Signed)
Request entered, forwarded to provider for approval.  

## 2014-12-04 NOTE — Telephone Encounter (Signed)
Patient's husband is calling to get a written Rx for oxyCODONE (ROXICODONE) 15 MG immediate release tablet. I advised the Rx will be ready in 24 hours unless otherwise advised by the nurse. Thank you.

## 2014-12-05 ENCOUNTER — Telehealth: Payer: Self-pay

## 2014-12-05 ENCOUNTER — Ambulatory Visit: Payer: Self-pay | Admitting: Neurology

## 2014-12-05 NOTE — Telephone Encounter (Signed)
Rx ready for pick up. 

## 2014-12-23 ENCOUNTER — Encounter: Payer: Self-pay | Admitting: Neurology

## 2014-12-23 ENCOUNTER — Ambulatory Visit (INDEPENDENT_AMBULATORY_CARE_PROVIDER_SITE_OTHER): Payer: Medicare Other | Admitting: Neurology

## 2014-12-23 VITALS — BP 132/75 | HR 77 | Ht 66.0 in | Wt 170.0 lb

## 2014-12-23 DIAGNOSIS — G231 Progressive supranuclear ophthalmoplegia [Steele-Richardson-Olszewski]: Secondary | ICD-10-CM

## 2014-12-23 DIAGNOSIS — R471 Dysarthria and anarthria: Secondary | ICD-10-CM | POA: Diagnosis not present

## 2014-12-23 DIAGNOSIS — R269 Unspecified abnormalities of gait and mobility: Secondary | ICD-10-CM

## 2014-12-23 HISTORY — DX: Dysarthria and anarthria: R47.1

## 2014-12-23 NOTE — Progress Notes (Signed)
Reason for visit: PSP  Emily Mccullough is an 69 y.o. female  History of present illness:  Emily Mccullough is a 69 year old right-handed white female with a history of progressive supranuclear palsy. The patient has had increasing problems with speech, her speech is essentially unintelligible at this time. The patient has a Data processing manager for this purpose. The patient has not had any falls with transfers, she can stand, she reports increased back pain with standing. She indicates that if she takes her oxycodone on a scheduled basis, she can stay ahead of the pain. She denies any problems with choking with swallowing, she is able to take her pills fairly well. She does have some problems with coughing, but not associated with meals. The patient is sleeping fairly well. She returns for an evaluation.  Past Medical History  Diagnosis Date  . PSP (progressive supranuclear palsy) 12/09    neuro Dr.Dayshia Ballinas, initially dx as parkinson, dx changed to PSP in 2010  . Depression   . GERD (gastroesophageal reflux disease)   . Osteoporosis   . Hyperlipidemia   . Hypothyroidism   . Hypertension   . Chronic back pain   . Hoarseness   . Colonic polyp   . Postmenopausal status   . SOB (shortness of breath)     s/p a negative Cardio-pulmonary stress test 08-27-04, normal PFT s 2006    . Allergic rhinitis   . Obese     Moderate  . Breast CA 1996  . Melanoma 2006    in situ nose,s/p mohs surgery  . Headache(784.0)     migraines   . Severe dysarthria 12/23/2014    Past Surgical History  Procedure Laterality Date  . Mastectomy    . Total abdominal hysterectomy w/ bilateral salpingoophorectomy  1990  . Fracture arm    . Spinal fusion  10/24/07    6 screws  . Cataract extraction      right    Family History  Problem Relation Age of Onset  . Coronary artery disease Father     MI at age 35  . Emphysema Father   . Colon cancer Father     age  . Hypertension Father   . Stroke      GF  .  Diabetes Mother   . Hypertension Mother   . Breast cancer Maternal Aunt     Social history:  reports that she has quit smoking. She has never used smokeless tobacco. She reports that she does not drink alcohol or use illicit drugs.    Allergies  Allergen Reactions  . Neosporin [Neomycin-Bacitracin Zn-Polymyx] Rash    Medications:  Prior to Admission medications   Medication Sig Start Date End Date Taking? Authorizing Provider  acetaminophen (TYLENOL) 500 MG tablet Take 500 mg by mouth every 6 (six) hours as needed for moderate pain.    Historical Provider, MD  CALCIUM-VITAMIN D PO Take 1 tablet by mouth daily.     Historical Provider, MD  furosemide (LASIX) 20 MG tablet Take 2 tablets (40 mg total) by mouth daily. 09/18/14   Colon Branch, MD  ibuprofen (ADVIL,MOTRIN) 200 MG tablet Take 200 mg by mouth every 6 (six) hours as needed for moderate pain.    Historical Provider, MD  levothyroxine (SYNTHROID, LEVOTHROID) 75 MCG tablet Take 1 tablet (75 mcg total) by mouth daily before breakfast. 09/18/14   Colon Branch, MD  losartan (COZAAR) 100 MG tablet Take 1 tablet (100 mg total) by mouth daily.  09/18/14   Colon Branch, MD  Multiple Vitamin (MULTIVITAMIN) capsule Take 1 capsule by mouth daily.      Historical Provider, MD  nortriptyline (PAMELOR) 25 MG capsule Take 1 capsule (25 mg total) by mouth at bedtime. 09/18/14   Colon Branch, MD  oxyCODONE (ROXICODONE) 15 MG immediate release tablet Take 1 tablet (15 mg total) by mouth every 6 (six) hours as needed for pain (Must last 28 days). 12/04/14   Kathrynn Ducking, MD  pantoprazole (PROTONIX) 40 MG tablet Take 1 tablet (40 mg total) by mouth 2 (two) times daily. 09/18/14   Colon Branch, MD  potassium chloride SA (K-DUR,KLOR-CON) 20 MEQ tablet Take 2 tablets (40 mEq total) by mouth 2 (two) times daily. 09/18/14   Colon Branch, MD    ROS:  Out of a complete 14 system review of symptoms, the patient complains only of the following symptoms, and all other reviewed  systems are negative.  Drooling Speech problems Gait disturbance  Blood pressure 132/75, pulse 77, height 5\' 6"  (1.676 m), weight 170 lb (77.111 kg).  Physical Exam  General: The patient is alert and cooperative at the time of the examination. The patient is moderately obese.  Respiratory: Lung fields are clear.  Cardiovascular: Regular rate and rhythm, no obvious murmurs are noted.  Skin: No significant peripheral edema is noted.   Neurologic Exam  Mental status: The patient is alert and oriented x 3 at the time of the examination. The patient has apparent normal recent and remote memory, with an apparently normal attention span and concentration ability.   Cranial nerves: Facial symmetry is present. Speech is severely hypophonic, dysarthric. Extraocular movements are full. Visual fields are full. Masking of the face is seen  Motor: The patient has good strength in all 4 extremities.  Sensory examination: Soft touch sensation is symmetric on the face, arms, and legs.  Coordination: The patient has good finger-nose-finger and heel-to-shin bilaterally.  Gait and station: The patient is able to stand up, she cannot effectively walk.  Reflexes: Deep tendon reflexes are symmetric.   Assessment/Plan:  1. Progressive supranuclear palsy  2. Gait disorder  3. Severe dysarthria  The patient indicates that she is not having problems with swallowing. This will need to be evaluated closely. The patient is having coughing on a regular basis, lung fields today are clear. The patient may require swallowing study in the future. She will follow-up in about 6 months.  Jill Alexanders MD 12/23/2014 8:08 PM  [facility] [address] [place], [place] [address]  Phone [phone] Fax [phone]

## 2014-12-23 NOTE — Patient Instructions (Signed)
Progressive Supranuclear Palsy Progressive supranuclear palsy is a rare brain disorder that causes a gradual deterioration of cells at the base of the brain. It causes serious problems with walking, eye movement, and balance. It may also cause behavior changes, depression, and loss of mental capacity (dementia).  Progressive supranuclear palsy tends to get worse over time.  CAUSES The exact cause of progressive supranuclear palsy is not known. In some cases, the condition may be caused by genes passed down through families (inherited). RISK FACTORS Progressive supranuclear palsy usually begins after age 65 and is slightly more common in men. SIGNS AND SYMPTOMS  The pattern of signs and symptoms can vary greatly from person to person. Signs and symptoms may include:  Loss of balance while walking.  Walking that is stiff and awkward.  Unexplained falls.  Inability to focus the eyes properly or blurred vision.  Changes in mood and behavior. These include:  Lack of feeling or emotion (apathy).  Irritability.  Sudden laughing or crying.  Personality changes.  Progressive mild dementia.  Difficulty swallowing and speaking. DIAGNOSIS  Your health care provider can diagnose progressive supranuclear palsy from your signs and symptoms. The main symptoms your health care provider will check for are:  Poor balance.  Vision problems.  Slurred speech.  Mental or behavioral changes. Your health care provider may also do some tests to rule out other causes of your symptoms. This may include checking your spinal fluid for abnormal proteins and taking images of your brain to look for areas of nerve cell loss. TREATMENT There is no cure for progressive supranuclear palsy. No treatment will slow the progression of the disease. Your treatment will be based on your symptoms. Treatment may include:  Medicines for Parkinson's disease. These may help with slowness, stiffness, and balance  problems.  Antidepressant medicines to treat depression.  Glasses called prisms to help with blurry vision.  Walking aids to prevent falls.  A surgical procedure to place a feeding tube into your stomach (gastrostomy) if swallowing becomes very hard. HOME CARE INSTRUCTIONS Work closely with your team of health care providers. Your team may include:  A physical therapist. Have him or her help you come up with a safe exercise program.  A speech and language therapist. Have him or her teach you ways to swallow foods and liquids safely. This specialist can also help you speak more clearly.  An occupational therapist. Have him or her help you learn to use walking aids and make your home safe. SEEK MEDICAL CARE IF:  You have chills or fever.  Your symptoms are getting worse.  You develop new symptoms.  You are having trouble getting enough nutrition.  You have a cough that will not go away.  You have shortness of breath.  You are anxious or depressed.  You are not getting enough support at home. SEEK IMMEDIATE MEDICAL CARE IF:  You choke, cough, or have trouble breathing after eating or drinking.  You hurt yourself in a fall.  You no longer feel safe at home. Document Released: 04/23/2002 Document Revised: 09/17/2013 Document Reviewed: 05/02/2013 [facility] Patient Information 2015 [place], [place]. This information is not intended to replace advice given to you by your health care provider. Make sure you discuss any questions you have with your health care provider.

## 2014-12-25 ENCOUNTER — Other Ambulatory Visit: Payer: Self-pay | Admitting: Internal Medicine

## 2014-12-30 ENCOUNTER — Ambulatory Visit (INDEPENDENT_AMBULATORY_CARE_PROVIDER_SITE_OTHER): Payer: Medicare Other | Admitting: Internal Medicine

## 2014-12-30 ENCOUNTER — Encounter: Payer: Self-pay | Admitting: Internal Medicine

## 2014-12-30 VITALS — BP 122/76 | HR 74 | Temp 98.0°F

## 2014-12-30 DIAGNOSIS — E039 Hypothyroidism, unspecified: Secondary | ICD-10-CM | POA: Diagnosis not present

## 2014-12-30 DIAGNOSIS — G231 Progressive supranuclear ophthalmoplegia [Steele-Richardson-Olszewski]: Secondary | ICD-10-CM

## 2014-12-30 DIAGNOSIS — Z1159 Encounter for screening for other viral diseases: Secondary | ICD-10-CM | POA: Diagnosis not present

## 2014-12-30 DIAGNOSIS — M25512 Pain in left shoulder: Secondary | ICD-10-CM

## 2014-12-30 DIAGNOSIS — I10 Essential (primary) hypertension: Secondary | ICD-10-CM | POA: Diagnosis not present

## 2014-12-30 DIAGNOSIS — Z0000 Encounter for general adult medical examination without abnormal findings: Secondary | ICD-10-CM

## 2014-12-30 MED ORDER — AMBULATORY NON FORMULARY MEDICATION: Status: DC

## 2014-12-30 NOTE — Patient Instructions (Addendum)
Get your blood work before you leave  --------------------  For her shoulder pain: Tylenol  500 mg OTC 2 tabs a day every 8 hours as needed for pain  IBUPROFEN (Advil or Motrin) 200 mg 2 tablets every 6 hours as needed for pain.  Always take it with food because may cause gastritis and ulcers.  If you notice nausea, stomach pain, change in the color of stools --->  Stop the medicine and let us know  If the shoulder pain is not better please let us know  ------------------------------ For cough, try 44 to do spirometry if possible

## 2014-12-30 NOTE — Assessment & Plan Note (Signed)
On Synthroid 75 g, check a TSH

## 2014-12-30 NOTE — Progress Notes (Signed)
Subjective:    Patient ID: Emily Mccullough, female    DOB: 12-16-1945, 69 y.o.   MRN: [id number]  DOS:  12/30/2014 Type of visit - description : Routine visit, here with her husband Interval history: PSP: Recently seen by neurology, felt to be stable. Cough, on-off, ongoing for few months, husband noted some chest congestion. No sputum production Hypertension: good compliance of medication, BP today very good Left shoulder pain: Patient reported pain of the left shoulder, she thinks related to the fact that she   Always sleeps on her left side and when she needs to push her out of bed she uses her left arm. No injury that she can recall. Hypothyroidism, good compliance of medication. Patient would like to get Zostavax and hepatitis C screening if possible.  Review of Systems  no fever chills No chest pain or difficulty breathing No nausea, vomiting, diarrhea  Past Medical History  Diagnosis Date  . PSP (progressive supranuclear palsy) 12/09    neuro Dr.Willis, initially dx as parkinson, dx changed to PSP in 2010  . Depression   . GERD (gastroesophageal reflux disease)   . Osteoporosis   . Hyperlipidemia   . Hypothyroidism   . Hypertension   . Chronic back pain   . Hoarseness   . Colonic polyp   . Postmenopausal status   . SOB (shortness of breath)     s/p a negative Cardio-pulmonary stress test 08-27-04, normal PFT s 2006    . Allergic rhinitis   . Obese     Moderate  . Breast CA 1996  . Melanoma 2006    in situ nose,s/p mohs surgery  . Headache(784.0)     migraines   . Severe dysarthria 12/23/2014    Past Surgical History  Procedure Laterality Date  . Mastectomy    . Total abdominal hysterectomy w/ bilateral salpingoophorectomy  1990  . Fracture arm    . Spinal fusion  10/24/07    6 screws  . Cataract extraction      right    Social History   Social History  . Marital Status: Married    Spouse Name: N/A  . Number of Children: 0  . Years of Education: N/A     Occupational History  . disable    Social History Main Topics  . Smoking status: Former Research scientist (life sciences)  . Smokeless tobacco: Never Used  . Alcohol Use: No  . Drug Use: No  . Sexual Activity: Not on file   Other Topics Concern  . Not on file   Social History Narrative   Household-- pt ans husband   Patient is right handed   Patient does not drink caffeine.        Medication List       This list is accurate as of: 12/30/14  3:41 PM.  Always use your most recent med list.               acetaminophen 500 MG tablet  Commonly known as:  TYLENOL  Take 500 mg by mouth every 6 (six) hours as needed for moderate pain.     AMBULATORY NON FORMULARY MEDICATION  Incentive Spirometer Dx: G23.1 (PSP)     CALCIUM-VITAMIN D PO  Take 1 tablet by mouth daily.     furosemide 20 MG tablet  Commonly known as:  LASIX  Take 2 tablets (40 mg total) by mouth daily.     ibuprofen 200 MG tablet  Commonly known as:  ADVIL,MOTRIN  Take  200 mg by mouth every 6 (six) hours as needed for moderate pain.     levothyroxine 75 MCG tablet  Commonly known as:  SYNTHROID, LEVOTHROID  Take 1 tablet (75 mcg total) by mouth daily before breakfast.     losartan 100 MG tablet  Commonly known as:  COZAAR  Take 1 tablet (100 mg total) by mouth daily.     multivitamin capsule  Take 1 capsule by mouth daily.     nortriptyline 25 MG capsule  Commonly known as:  PAMELOR  Take 1 capsule (25 mg total) by mouth at bedtime.     oxyCODONE 15 MG immediate release tablet  Commonly known as:  ROXICODONE  Take 1 tablet (15 mg total) by mouth every 6 (six) hours as needed for pain (Must last 28 days).     pantoprazole 40 MG tablet  Commonly known as:  PROTONIX  Take 1 tablet (40 mg total) by mouth 2 (two) times daily.     potassium chloride SA 20 MEQ tablet  Commonly known as:  K-DUR,KLOR-CON  Take 2 tablets (40 mEq total) by mouth 2 (two) times daily.           Objective:   Physical Exam BP 122/76  mmHg  Pulse 74  Temp(Src) 98 F (36.7 C) (Oral)  Ht   Wt   SpO2 98% General:   Well developed, well nourished, sitting in wheelchair in no distress  HEENT:  Normocephalic . Face symmetric, atraumatic Lungs: Decreased breath sounds, no crackles, poor inspiratory effort.   Heart: RRR,  no murmur.  No pretibial edema bilaterally  Skin: Not pale. Not jaundice Neurologic:  alert & oriented X3.   severe dysarthria, motor: Symmetric, generalized weakness. MSK: Shoulders symmetric, range of motion of the left shoulder decreased particularly with passive elevation due to pain. Psych--  Behavior appropriate. No anxious or depressed appearing.      Assessment & Plan:    shoulder pain: Recommend Tylenol and Motrin as needed and assist (GI precautions discussed), see instructions, if not better will call for a sports medicine referral.

## 2014-12-30 NOTE — Progress Notes (Signed)
Pre visit review using our clinic review tool, if applicable. No additional management support is needed unless otherwise documented below in the visit note. 

## 2014-12-30 NOTE — Assessment & Plan Note (Signed)
Seems to be well-controlled, continue with losartan, potassium, Lasix. Check a BMP

## 2014-12-30 NOTE — Assessment & Plan Note (Addendum)
Request a hepatitis C screening, see orders Requests Zostavax: Contraindicated, patient is allergic to neomycin

## 2014-12-30 NOTE — Assessment & Plan Note (Addendum)
Seems to be stable. She does have chronic cough, likely due to poor air movement- poor secretion management-immobility-atelectasis d/t PSP, recommend incentive spirometry

## 2014-12-31 LAB — BASIC METABOLIC PANEL
BUN: 18 mg/dL (ref 6–23)
CALCIUM: 9.7 mg/dL (ref 8.4–10.5)
CO2: 27 mEq/L (ref 19–32)
CREATININE: 0.72 mg/dL (ref 0.40–1.20)
Chloride: 104 mEq/L (ref 96–112)
GFR: 85.11 mL/min (ref >60.00)
Glucose, Bld: 76 mg/dL (ref 70–99)
Potassium: 3.9 mEq/L (ref 3.5–5.1)
SODIUM: 140 meq/L (ref 135–145)

## 2014-12-31 LAB — HEPATITIS C ANTIBODY: HCV AB: NEGATIVE

## 2014-12-31 LAB — TSH: TSH: 1.54 u[IU]/mL (ref 0.35–4.50)

## 2015-01-07 ENCOUNTER — Telehealth: Payer: Self-pay | Admitting: Neurology

## 2015-01-07 MED ORDER — OXYCODONE HCL 15 MG PO TABS: 15.0000 mg | ORAL_TABLET | Freq: Four times a day (QID) | ORAL | Status: DC | PRN

## 2015-01-07 NOTE — Telephone Encounter (Signed)
I will write a prescription.

## 2015-01-07 NOTE — Telephone Encounter (Signed)
Patient's husband is calling to order written Rx oxycodone 15 mg.  Thanks!

## 2015-01-08 ENCOUNTER — Telehealth: Payer: Self-pay

## 2015-01-08 NOTE — Telephone Encounter (Signed)
Rx ready for pick up. 

## 2015-01-22 ENCOUNTER — Ambulatory Visit: Payer: Medicare Other | Admitting: Neurology

## 2015-01-30 DIAGNOSIS — H2512 Age-related nuclear cataract, left eye: Secondary | ICD-10-CM | POA: Diagnosis not present

## 2015-01-30 DIAGNOSIS — Z961 Presence of intraocular lens: Secondary | ICD-10-CM | POA: Diagnosis not present

## 2015-01-30 DIAGNOSIS — D3131 Benign neoplasm of right choroid: Secondary | ICD-10-CM | POA: Diagnosis not present

## 2015-01-30 DIAGNOSIS — H04123 Dry eye syndrome of bilateral lacrimal glands: Secondary | ICD-10-CM | POA: Diagnosis not present

## 2015-02-11 ENCOUNTER — Telehealth: Payer: Self-pay | Admitting: Neurology

## 2015-02-11 MED ORDER — OXYCODONE HCL 15 MG PO TABS: 15.0000 mg | ORAL_TABLET | Freq: Four times a day (QID) | ORAL | Status: DC | PRN

## 2015-02-11 NOTE — Telephone Encounter (Signed)
I called in the prescription for the oxycodone.

## 2015-02-11 NOTE — Telephone Encounter (Signed)
Pt needs refill on oxyCODONE (ROXICODONE) 15 MG immediate release tablet. Thank you

## 2015-02-12 ENCOUNTER — Telehealth: Payer: Self-pay

## 2015-02-12 NOTE — Telephone Encounter (Signed)
Rx ready for pick up. 

## 2015-03-12 ENCOUNTER — Other Ambulatory Visit: Payer: Self-pay | Admitting: Internal Medicine

## 2015-03-25 ENCOUNTER — Other Ambulatory Visit: Payer: Self-pay | Admitting: Neurology

## 2015-03-25 MED ORDER — OXYCODONE HCL 15 MG PO TABS: 15.0000 mg | ORAL_TABLET | Freq: Four times a day (QID) | ORAL | Status: DC | PRN

## 2015-03-25 NOTE — Telephone Encounter (Signed)
Pt's husband called for refill on oxyCODONE (ROXICODONE) 15 MG immediate release tablet . He sts he will picks it up everytime , I advised he was not on DPR. Advised RX will be ready within 24hrs unless informed otherwise by RN

## 2015-03-25 NOTE — Telephone Encounter (Signed)
Request entered, forwarded to provider for review.  

## 2015-03-26 ENCOUNTER — Telehealth: Payer: Self-pay

## 2015-03-26 NOTE — Telephone Encounter (Signed)
Rx placed up front for pick up. Called patient. No answer.

## 2015-03-27 ENCOUNTER — Other Ambulatory Visit: Payer: Self-pay | Admitting: Internal Medicine

## 2015-03-31 NOTE — Telephone Encounter (Signed)
Tried reaching patient again. No answer. Will send letter.

## 2015-04-03 ENCOUNTER — Telehealth: Payer: Self-pay | Admitting: Internal Medicine

## 2015-04-03 NOTE — Telephone Encounter (Signed)
Caller name:Shareef,Jackson Relation to pt: spouse Call back number: [phone]   Reason for call:  Spouse requesting PCP to recommend an agency that will assisting patient in and out of bed and her wheelchair.

## 2015-04-04 NOTE — Telephone Encounter (Signed)
FYI

## 2015-04-04 NOTE — Telephone Encounter (Signed)
Patient scheduled for 04/08/15 at 11:30am

## 2015-04-04 NOTE — Telephone Encounter (Signed)
Pt will need an OV. Last appt was 12/30/2014, Pt must be seen within 90 days of home health request. Please have Pt and/or husband schedule appt at their convenience.

## 2015-04-04 NOTE — Telephone Encounter (Signed)
LVM advising patient of message below °

## 2015-04-08 ENCOUNTER — Encounter: Payer: Self-pay | Admitting: Internal Medicine

## 2015-04-08 ENCOUNTER — Encounter (HOSPITAL_COMMUNITY): Payer: Self-pay

## 2015-04-08 ENCOUNTER — Ambulatory Visit (INDEPENDENT_AMBULATORY_CARE_PROVIDER_SITE_OTHER): Payer: Medicare Other | Admitting: Internal Medicine

## 2015-04-08 ENCOUNTER — Ambulatory Visit (HOSPITAL_BASED_OUTPATIENT_CLINIC_OR_DEPARTMENT_OTHER)
Admission: RE | Admit: 2015-04-08 | Discharge: 2015-04-08 | Disposition: A | Discharge status: Home / Self Care | Payer: Medicare Other | Source: Ambulatory Visit | Attending: Internal Medicine | Admitting: Internal Medicine

## 2015-04-08 ENCOUNTER — Inpatient Hospital Stay (HOSPITAL_BASED_OUTPATIENT_CLINIC_OR_DEPARTMENT_OTHER)
Admission: AD | Admit: 2015-04-08 | Discharge: 2015-04-13 | DRG: 871 | Disposition: A | Discharge status: Home / Self Care | Payer: Medicare Other | Source: Ambulatory Visit | Attending: Internal Medicine | Admitting: Internal Medicine

## 2015-04-08 VITALS — BP 124/76 | HR 92 | Temp 99.4°F

## 2015-04-08 DIAGNOSIS — Z9841 Cataract extraction status, right eye: Secondary | ICD-10-CM

## 2015-04-08 DIAGNOSIS — R05 Cough: Secondary | ICD-10-CM | POA: Diagnosis not present

## 2015-04-08 DIAGNOSIS — M549 Dorsalgia, unspecified: Secondary | ICD-10-CM | POA: Diagnosis present

## 2015-04-08 DIAGNOSIS — E038 Other specified hypothyroidism: Secondary | ICD-10-CM

## 2015-04-08 DIAGNOSIS — N951 Menopausal and female climacteric states: Secondary | ICD-10-CM | POA: Diagnosis not present

## 2015-04-08 DIAGNOSIS — F32A Depression, unspecified: Secondary | ICD-10-CM | POA: Diagnosis present

## 2015-04-08 DIAGNOSIS — A419 Sepsis, unspecified organism: Secondary | ICD-10-CM | POA: Diagnosis present

## 2015-04-08 DIAGNOSIS — M810 Age-related osteoporosis without current pathological fracture: Secondary | ICD-10-CM | POA: Diagnosis present

## 2015-04-08 DIAGNOSIS — G8929 Other chronic pain: Secondary | ICD-10-CM | POA: Diagnosis present

## 2015-04-08 DIAGNOSIS — Z981 Arthrodesis status: Secondary | ICD-10-CM

## 2015-04-08 DIAGNOSIS — R471 Dysarthria and anarthria: Secondary | ICD-10-CM | POA: Diagnosis not present

## 2015-04-08 DIAGNOSIS — R0602 Shortness of breath: Secondary | ICD-10-CM | POA: Diagnosis not present

## 2015-04-08 DIAGNOSIS — I10 Essential (primary) hypertension: Secondary | ICD-10-CM | POA: Diagnosis present

## 2015-04-08 DIAGNOSIS — Z85820 Personal history of malignant melanoma of skin: Secondary | ICD-10-CM | POA: Diagnosis not present

## 2015-04-08 DIAGNOSIS — J90 Pleural effusion, not elsewhere classified: Secondary | ICD-10-CM | POA: Diagnosis present

## 2015-04-08 DIAGNOSIS — K219 Gastro-esophageal reflux disease without esophagitis: Secondary | ICD-10-CM | POA: Diagnosis present

## 2015-04-08 DIAGNOSIS — Z853 Personal history of malignant neoplasm of breast: Secondary | ICD-10-CM

## 2015-04-08 DIAGNOSIS — E034 Atrophy of thyroid (acquired): Secondary | ICD-10-CM

## 2015-04-08 DIAGNOSIS — R509 Fever, unspecified: Secondary | ICD-10-CM

## 2015-04-08 DIAGNOSIS — J309 Allergic rhinitis, unspecified: Secondary | ICD-10-CM | POA: Diagnosis not present

## 2015-04-08 DIAGNOSIS — J189 Pneumonia, unspecified organism: Secondary | ICD-10-CM | POA: Diagnosis not present

## 2015-04-08 DIAGNOSIS — Z780 Asymptomatic menopausal state: Secondary | ICD-10-CM

## 2015-04-08 DIAGNOSIS — R059 Cough, unspecified: Secondary | ICD-10-CM

## 2015-04-08 DIAGNOSIS — Z87891 Personal history of nicotine dependence: Secondary | ICD-10-CM | POA: Diagnosis not present

## 2015-04-08 DIAGNOSIS — E039 Hypothyroidism, unspecified: Secondary | ICD-10-CM | POA: Diagnosis present

## 2015-04-08 DIAGNOSIS — Z79899 Other long term (current) drug therapy: Secondary | ICD-10-CM

## 2015-04-08 DIAGNOSIS — F329 Major depressive disorder, single episode, unspecified: Secondary | ICD-10-CM | POA: Diagnosis present

## 2015-04-08 DIAGNOSIS — E785 Hyperlipidemia, unspecified: Secondary | ICD-10-CM | POA: Diagnosis present

## 2015-04-08 DIAGNOSIS — G231 Progressive supranuclear ophthalmoplegia [Steele-Richardson-Olszewski]: Secondary | ICD-10-CM | POA: Diagnosis not present

## 2015-04-08 DIAGNOSIS — J168 Pneumonia due to other specified infectious organisms: Secondary | ICD-10-CM | POA: Diagnosis not present

## 2015-04-08 DIAGNOSIS — Z09 Encounter for follow-up examination after completed treatment for conditions other than malignant neoplasm: Secondary | ICD-10-CM | POA: Insufficient documentation

## 2015-04-08 LAB — COMPREHENSIVE METABOLIC PANEL
ALT: 140 U/L — ABNORMAL HIGH (ref 14–54)
AST: 82 U/L — AB (ref 15–41)
Albumin: 2.6 g/dL — ABNORMAL LOW (ref 3.5–5.0)
Alkaline Phosphatase: 203 U/L — ABNORMAL HIGH (ref 38–126)
Anion gap: 12 (ref 5–15)
BILIRUBIN TOTAL: 0.9 mg/dL (ref 0.3–1.2)
BUN: 16 mg/dL (ref 6–20)
CO2: 23 mmol/L (ref 22–32)
CREATININE: 0.59 mg/dL (ref 0.44–1.00)
Calcium: 8.8 mg/dL — ABNORMAL LOW (ref 8.9–10.3)
Chloride: 107 mmol/L (ref 101–111)
GFR calc Af Amer: >60 mL/min (ref >60)
Glucose, Bld: 112 mg/dL — ABNORMAL HIGH (ref 65–99)
POTASSIUM: 3.6 mmol/L (ref 3.5–5.1)
Sodium: 142 mmol/L (ref 135–145)
TOTAL PROTEIN: 7.2 g/dL (ref 6.5–8.1)

## 2015-04-08 LAB — CBC
HEMATOCRIT: 23.6 % — AB (ref 36.0–46.0)
Hemoglobin: 7.7 g/dL — ABNORMAL LOW (ref 12.0–15.0)
MCH: 26.6 pg (ref 26.0–34.0)
MCHC: 32.6 g/dL (ref 30.0–36.0)
MCV: 81.4 fL (ref 78.0–100.0)
Platelets: 621 10*3/uL — ABNORMAL HIGH (ref 150–400)
RBC: 2.9 MIL/uL — ABNORMAL LOW (ref 3.87–5.11)
RDW: 15.6 % — ABNORMAL HIGH (ref 11.5–15.5)
WBC: 28.9 10*3/uL — AB (ref 4.0–10.5)

## 2015-04-08 MED ORDER — SODIUM CHLORIDE 0.9 % IV SOLN: 250.0000 mL | INTRAVENOUS | Status: DC | PRN

## 2015-04-08 MED ORDER — SODIUM CHLORIDE 0.9 % IJ SOLN: 3.0000 mL | INTRAMUSCULAR | Status: DC | PRN

## 2015-04-08 MED ORDER — LOSARTAN POTASSIUM 50 MG PO TABS
100.0000 mg | ORAL_TABLET | Freq: Every day | ORAL | Status: DC
  Administered 2015-04-09 – 2015-04-13 (×5): 100 mg via ORAL
  Filled 2015-04-08 (×5): qty 2

## 2015-04-08 MED ORDER — OXYCODONE HCL 5 MG PO TABS
15.0000 mg | ORAL_TABLET | Freq: Four times a day (QID) | ORAL | Status: DC | PRN
  Administered 2015-04-12 (×2): 15 mg via ORAL
  Filled 2015-04-08 (×2): qty 3

## 2015-04-08 MED ORDER — ONDANSETRON HCL 4 MG PO TABS
4.0000 mg | ORAL_TABLET | Freq: Four times a day (QID) | ORAL | Status: DC | PRN
Start: 2015-04-08 — End: 2015-04-13

## 2015-04-08 MED ORDER — PANTOPRAZOLE SODIUM 40 MG PO TBEC
40.0000 mg | DELAYED_RELEASE_TABLET | Freq: Two times a day (BID) | ORAL | Status: DC
  Administered 2015-04-08 – 2015-04-13 (×10): 40 mg via ORAL
  Filled 2015-04-08 (×13): qty 1

## 2015-04-08 MED ORDER — ADULT MULTIVITAMIN W/MINERALS CH
1.0000 | ORAL_TABLET | Freq: Every day | ORAL | Status: DC
  Administered 2015-04-09 – 2015-04-13 (×5): 1 via ORAL
  Filled 2015-04-08 (×6): qty 1

## 2015-04-08 MED ORDER — LEVOTHYROXINE SODIUM 75 MCG PO TABS
75.0000 ug | ORAL_TABLET | Freq: Every day | ORAL | Status: DC
  Administered 2015-04-09 – 2015-04-13 (×5): 75 ug via ORAL
  Filled 2015-04-08 (×5): qty 1

## 2015-04-08 MED ORDER — ACETAMINOPHEN 325 MG PO TABS: 650.0000 mg | ORAL_TABLET | Freq: Four times a day (QID) | ORAL | Status: DC | PRN

## 2015-04-08 MED ORDER — SODIUM CHLORIDE 0.9 % IJ SOLN
3.0000 mL | Freq: Two times a day (BID) | INTRAMUSCULAR | Status: DC
  Administered 2015-04-08 – 2015-04-12 (×5): 3 mL via INTRAVENOUS

## 2015-04-08 MED ORDER — ONDANSETRON HCL 4 MG/2ML IJ SOLN: 4.0000 mg | Freq: Four times a day (QID) | INTRAMUSCULAR | Status: DC | PRN

## 2015-04-08 MED ORDER — NORTRIPTYLINE HCL 25 MG PO CAPS
25.0000 mg | ORAL_CAPSULE | Freq: Every day | ORAL | Status: DC
  Administered 2015-04-08 – 2015-04-12 (×5): 25 mg via ORAL
  Filled 2015-04-08 (×5): qty 1

## 2015-04-08 MED ORDER — DEXTROSE 5 % IV SOLN
500.0000 mg | INTRAVENOUS | Status: DC
  Administered 2015-04-08: 500 mg via INTRAVENOUS
  Filled 2015-04-08 (×2): qty 500

## 2015-04-08 MED ORDER — CEFTRIAXONE SODIUM 1 G IJ SOLR
1.0000 g | INTRAMUSCULAR | Status: DC
  Administered 2015-04-08: 1 g via INTRAVENOUS
  Filled 2015-04-08 (×2): qty 10

## 2015-04-08 MED ORDER — ACETAMINOPHEN 650 MG RE SUPP: 650.0000 mg | Freq: Four times a day (QID) | RECTAL | Status: DC | PRN

## 2015-04-08 NOTE — Assessment & Plan Note (Signed)
Pneumonia Patient presented to the office not feeling well for the last 3 weeks, chest x-ray confirmed bilateral pneumonia. She is high risk for  complications, I recommend inpatient therapy. The family is in complete agreement. Case discussed with Dr. Allyson Sabal, will send her to [facility] admitting office via private vehicle

## 2015-04-08 NOTE — H&P (Signed)
[facility] History and Physical  Emily Mccullough [id number] DOB: 1945/07/22 DOA: 04/08/2015  Referring physician: Dr. Kathlene November, PCP PCP: Kathlene November, MD  Specialists:   Chief Complaint: Cough  HPI: Emily Mccullough is a 69 y.o. female  With a history of depression, hypertension, hypothyroidism, PSP presented as a direct admission from Dr. Larose Kells.  Patient presented to his office today after feeling poorly with cough for the past 3 weeks. Patient did appear more somnolent than usual. She's been very weak. Patient was also noted to have a low-grade fever at home. States that she's had a productive cough with bright yellow sputum. Lives at home with her husband. Denies any sick contacts, recent travel. Patient has not received her implants a vaccination this year.  While at her PCPs office today, chest x-ray conducted showing pneumonia. Patient [facility] to [facility].  Review of Systems:  Constitutional: Denies fever, chills, diaphoresis, appetite change and fatigue.  HEENT: Denies photophobia, eye pain, redness, hearing loss, ear pain, congestion, sore throat, rhinorrhea, sneezing, mouth sores, trouble swallowing, neck pain, neck stiffness and tinnitus.   Respiratory: Complains of cough  and mild shortness of breath for 3 weeks Cardiovascular: Denies chest pain, palpitations and leg swelling.  Gastrointestinal: Denies nausea, vomiting, abdominal pain, diarrhea, constipation, blood in stool and abdominal distention.  Genitourinary: Denies dysuria, urgency, frequency, hematuria, flank pain and difficulty urinating.  Musculoskeletal: Denies myalgias, back pain, joint swelling, arthralgias and gait problem.  Skin: Denies pallor, rash and wound.  Neurological: Denies dizziness, seizures, syncope, weakness, light-headedness, numbness and headaches.  Hematological: Denies adenopathy. Easy bruising, personal or family bleeding history  Psychiatric/Behavioral: Denies  suicidal ideation, mood changes, confusion, nervousness, sleep disturbance and agitation  Past Medical History  Diagnosis Date  . PSP (progressive supranuclear palsy) ([place]) 12/09    neuro Dr.Willis, initially dx as parkinson, dx changed to PSP in 2010  . Depression   . GERD (gastroesophageal reflux disease)   . Osteoporosis   . Hyperlipidemia   . Hypothyroidism   . Hypertension   . Chronic back pain   . Hoarseness   . Colonic polyp   . Postmenopausal status   . SOB (shortness of breath)     s/p a negative Cardio-pulmonary stress test 08-27-04, normal PFT s 2006    . Allergic rhinitis   . Obese     Moderate  . Breast CA ([place]) 1996  . Melanoma ([place]) 2006    in situ nose,s/p mohs surgery  . Headache(784.0)     migraines   . Severe dysarthria 12/23/2014   Past Surgical History  Procedure Laterality Date  . Mastectomy    . Total abdominal hysterectomy w/ bilateral salpingoophorectomy  1990  . Fracture arm    . Spinal fusion  10/24/07    6 screws  . Cataract extraction      right   Social History:  reports that she has quit smoking. She has never used smokeless tobacco. She reports that she does not drink alcohol or use illicit drugs.   Allergies  Allergen Reactions  . Zostavax [Zoster Vaccine Live]     Unable to take d/t Neosporin allergy  . Neosporin [Neomycin-Bacitracin Zn-Polymyx] Rash    Family History  Problem Relation Age of Onset  . Coronary artery disease Father     MI at age 71  . Emphysema Father   . Colon cancer Father     age  . Hypertension Father   . Stroke  GF  . Diabetes Mother   . Hypertension Mother   . Breast cancer Maternal Aunt     Prior to Admission medications   Medication Sig Start Date End Date Taking? Authorizing Provider  acetaminophen (TYLENOL) 500 MG tablet Take 500 mg by mouth every 6 (six) hours as needed for moderate pain.   Yes Historical Provider, MD  CALCIUM-VITAMIN D PO Take 1 tablet by mouth daily.    Yes Historical  Provider, MD  furosemide (LASIX) 20 MG tablet Take 2 tablets (40 mg total) by mouth daily. 03/12/15  Yes Colon Branch, MD  ibuprofen (ADVIL,MOTRIN) 200 MG tablet Take 600 mg by mouth every 6 (six) hours as needed for moderate pain.    Yes Historical Provider, MD  levothyroxine (SYNTHROID, LEVOTHROID) 75 MCG tablet Take 1 tablet (75 mcg total) by mouth daily before breakfast. 09/18/14  Yes Colon Branch, MD  losartan (COZAAR) 100 MG tablet Take 1 tablet (100 mg total) by mouth daily. 09/18/14  Yes Colon Branch, MD  MAGNESIUM PO Take 1 tablet by mouth every evening.   Yes Historical Provider, MD  Multiple Vitamin (MULTIVITAMIN) capsule Take 1 capsule by mouth daily.     Yes Historical Provider, MD  nortriptyline (PAMELOR) 25 MG capsule Take 1 capsule (25 mg total) by mouth at bedtime. 09/18/14  Yes Colon Branch, MD  oxyCODONE (ROXICODONE) 15 MG immediate release tablet Take 1 tablet (15 mg total) by mouth every 6 (six) hours as needed for pain (Must last 28 days). 03/25/15  Yes Kathrynn Ducking, MD  pantoprazole (PROTONIX) 40 MG tablet Take 1 tablet (40 mg total) by mouth 2 (two) times daily. 09/18/14  Yes Colon Branch, MD  potassium chloride SA (K-DUR,KLOR-CON) 20 MEQ tablet Take 2 tablets (40 mEq total) by mouth 2 (two) times daily. 03/27/15  Yes Colon Branch, MD  AMBULATORY NON FORMULARY MEDICATION Incentive Spirometer Dx: G23.1 (PSP) 12/30/14   Colon Branch, MD   Physical Exam: Filed Vitals:   04/08/15 1400  BP: 129/65  Pulse: 84  Temp: 97.7 F (36.5 C)  Resp: 18     General: Well developed, well nourished, no apparent distress, somnolent  HEENT: NCAT, PERRLA, EOMI, Anicteic Sclera, mucous membranes mildly dry.   Neck: Supple, no JVD, no masses  Cardiovascular: S1 S2 auscultated, no rubs, murmurs or gallops. Regular rate and rhythm.  Respiratory: Poor inspiration effort, decreased lung sounds, few crackles  Abdomen: Soft, nontender, nondistended, + bowel sounds  Extremities: warm dry without cyanosis  clubbing or edema  Neuro: AAOx3, cranial nerves grossly intact. Strength 5/5 in patient's upper and lower extremities bilaterally  Skin: Without rashes exudates or nodules  Psych: Normal affect and demeanor  Labs on Admission:  Basic Metabolic Panel: No results for input(s): NA, K, CL, CO2, GLUCOSE, BUN, CREATININE, CALCIUM, MG, PHOS in the last 168 hours. Liver Function Tests: No results for input(s): AST, ALT, ALKPHOS, BILITOT, PROT, ALBUMIN in the last 168 hours. No results for input(s): LIPASE, AMYLASE in the last 168 hours. No results for input(s): AMMONIA in the last 168 hours. CBC: No results for input(s): WBC, NEUTROABS, HGB, HCT, MCV, PLT in the last 168 hours. Cardiac Enzymes: No results for input(s): CKTOTAL, CKMB, CKMBINDEX, TROPONINI in the last 168 hours.  BNP (last 3 results) No results for input(s): BNP in the last 8760 hours.  ProBNP (last 3 results) No results for input(s): PROBNP in the last 8760 hours.  CBG: No results for input(s): GLUCAP  in the last 168 hours.  Radiological Exams on Admission: Dg Chest 2 View  04/08/2015  CLINICAL DATA:  Cough and fever.  Evaluate for pneumonia EXAM: CHEST  2 VIEW COMPARISON:  05/15/2008 FINDINGS: Dense airspace disease at the left base with pleural effusion. No cavitary finding. There is likely also airspace disease at the medial right base. No edema or air leak. Normal non obscured heart size. IMPRESSION: 1. Bibasilar pneumonia, more extensive on the left where there is small pleural effusion. 2. Followup PA and lateral chest X-ray is recommended in 3-4 weeks following trial of antibiotic therapy to ensure resolution and exclude underlying malignancy. Electronically Signed   By: Monte Fantasia M.D.   On: 04/08/2015 12:29    EKG: None  Assessment/Plan  Bibasilar pneumonia -Patient is instructed admission from Dr. Larose Kells -Patient has had cough for the last 3 weeks, and presented to her primary care physician's  office -Chest x-ray showed bilateral pneumonia with small pleural effusion on the left; recommended repeat chest x-ray in 3-4 weeks -Will start patient on azithromycin and ceftriaxone -Pending CBC, CMP, blood cultures, sputum culture, Legionella and strep pneumonia antigens  Hypothyroidism -Continue Synthroid  Hypertension -Continue losartan -Lasix held until labs obtained  History of breast cancer and melanoma -Stable  Depression -Continue Pamelor  GERD -Continue Protonix  Generalized weakness -Will consult PT and OT  History of PSP -Follows with Dr. Jannifer Franklin, neurology  DVT prophylaxis: SCDs  Code Status: Full  Condition: Guarded  Family Communication: None at bedside. Admission, patients condition and plan of care including tests being ordered have been discussed with the patient, who indicates understanding and agrees with the plan and Code Status.  Disposition Plan: Admitted.  Time spent: 60 minutes  Brittanya Winburn D.O. [facility] Pager [phone]  If 7PM-7AM, please contact night-coverage [email or web address] Password [facility] 04/08/2015, 4:02 PM

## 2015-04-08 NOTE — Progress Notes (Signed)
Patient ID: Emily Mccullough, female   DOB: 1946/03/02, 69 y.o.   MRN: [id number]  69 year old right-handed white female with a history of progressive supranuclear palsy, seen at PCP for cough m,cxr shows Bibasilar pneumonia, more extensive on the left where there is small pleural effusion.Dr Larose Kells would like to admit her for PNA, SATS 94% ON RA, BP in the [id number] systolic ,admit to tele

## 2015-04-08 NOTE — Progress Notes (Signed)
Subjective:    Patient ID: Emily Mccullough, female    DOB: October 17, 1945, 69 y.o.   MRN: [id number]  DOS:  04/08/2015 Type of visit - description : Here for a checkup. Interval history: Visit initially set up because she needed a face-to-face encounter in order to get home health however the family reports she has not been doing well for the last 3 weeks: More sleepy, weaker than usual. This week in 3 opportunities she slid on the floor when she was trying to get into bed. Husband  had to call a   family member or EMS to get her up. We noticed that she has low-grade fever today despite the fact that she took Advil this morning, they report she has been coughing for the last 10 days, increasingly worse, worse at night   Review of Systems The ROS is done through the family and also by asking pt yeas  and no questions  Appetite is okay No difficulty breathing No nausea, vomiting or abdominal pain No dysuria or gross hematuria.   Past Medical History  Diagnosis Date  . PSP (progressive supranuclear palsy) ([place]) 12/09    neuro Dr.Willis, initially dx as parkinson, dx changed to PSP in 2010  . Depression   . GERD (gastroesophageal reflux disease)   . Osteoporosis   . Hyperlipidemia   . Hypothyroidism   . Hypertension   . Chronic back pain   . Hoarseness   . Colonic polyp   . Postmenopausal status   . SOB (shortness of breath)     s/p a negative Cardio-pulmonary stress test 08-27-04, normal PFT s 2006    . Allergic rhinitis   . Obese     Moderate  . Breast CA ([place]) 1996  . Melanoma ([place]) 2006    in situ nose,s/p mohs surgery  . Headache(784.0)     migraines   . Severe dysarthria 12/23/2014    Past Surgical History  Procedure Laterality Date  . Mastectomy    . Total abdominal hysterectomy w/ bilateral salpingoophorectomy  1990  . Fracture arm    . Spinal fusion  10/24/07    6 screws  . Cataract extraction      right    Social History   Social History  . Marital  Status: Married    Spouse Name: N/A  . Number of Children: 0  . Years of Education: N/A   Occupational History  . disable    Social History Main Topics  . Smoking status: Former Research scientist (life sciences)  . Smokeless tobacco: Never Used  . Alcohol Use: No  . Drug Use: No  . Sexual Activity: No   Other Topics Concern  . Not on file   Social History Narrative   Household-- pt ans husband   Patient is right handed   Patient does not drink caffeine.        Medication List       This list is accurate as of: 04/08/15  2:01 PM.  Always use your most recent med list.               acetaminophen 500 MG tablet  Commonly known as:  TYLENOL  Take 500 mg by mouth every 6 (six) hours as needed for moderate pain.     AMBULATORY NON FORMULARY MEDICATION  Incentive Spirometer Dx: G23.1 (PSP)     CALCIUM-VITAMIN D PO  Take 1 tablet by mouth daily.     furosemide 20 MG tablet  Commonly known as:  LASIX  Take 2 tablets (40 mg total) by mouth daily.     ibuprofen 200 MG tablet  Commonly known as:  ADVIL,MOTRIN  Take 600 mg by mouth every 6 (six) hours as needed for moderate pain.     levothyroxine 75 MCG tablet  Commonly known as:  SYNTHROID, LEVOTHROID  Take 1 tablet (75 mcg total) by mouth daily before breakfast.     losartan 100 MG tablet  Commonly known as:  COZAAR  Take 1 tablet (100 mg total) by mouth daily.     multivitamin capsule  Take 1 capsule by mouth daily.     nortriptyline 25 MG capsule  Commonly known as:  PAMELOR  Take 1 capsule (25 mg total) by mouth at bedtime.     oxyCODONE 15 MG immediate release tablet  Commonly known as:  ROXICODONE  Take 1 tablet (15 mg total) by mouth every 6 (six) hours as needed for pain (Must last 28 days).     pantoprazole 40 MG tablet  Commonly known as:  PROTONIX  Take 1 tablet (40 mg total) by mouth 2 (two) times daily.     potassium chloride SA 20 MEQ tablet  Commonly known as:  K-DUR,KLOR-CON  Take 2 tablets (40 mEq total) by  mouth 2 (two) times daily.           Objective:   Physical Exam BP 124/76 mmHg  Pulse 92  Temp(Src) 99.4 F (37.4 C) (Oral)  Ht   Wt   SpO2 96% General:   Well developed,  sitting a wheelchair in NAD.  HEENT:  Normocephalic . Face symmetric, atraumatic Lungs:   poor inspiratory effort, rhonchi at the bases, few crackles B?  Normal respiratory effort, no intercostal retractions, no accessory muscle use. Heart: RRR,  no murmur.  no pretibial edema bilaterally  Abdomen:  Not distended, soft, non-tender.  Skin: Not pale. Not jaundice Neurologic:  alert , no formal testing but seems oriented 3 , followed commands, she understands and answer my questions appropriately. Psych--   No anxious or depressed appearing.    Assessment & Plan:   Assessment> HTN Hyperlipidemia Hypothyroidism Depression Osteoporosis GERD PSP dx 2009 initially dx w/ Parkinson, dx changed to PSP 2010 H/o Breast cancer 1996 H/o Melanoma 2006 H/o SOB: Negative cardiopulmonary stress test 2006, normal PFTs 2006   Plan: Pneumonia Patient presented to the office not feeling well for the last 3 weeks, chest x-ray confirmed bilateral pneumonia. She is high risk for  complications, I recommend inpatient therapy. The family is in complete agreement. Case discussed with Dr. Allyson Sabal, will send her to [facility] admitting office via private vehicle

## 2015-04-08 NOTE — Patient Instructions (Addendum)
Please go to [facility] admitting office for a direct  Admission (telemetry)  Room 1409  I already talk with the  doctor on call ( Dr Allyson Sabal)

## 2015-04-08 NOTE — Progress Notes (Signed)
ANTIBIOTIC CONSULT NOTE - INITIAL  Pharmacy Consult for Ceftriaxone, antibiotic renal dose adjustment Indication: pneumonia  Allergies  Allergen Reactions  . Zostavax [Zoster Vaccine Live]     Unable to take d/t Neosporin allergy  . Neosporin [Neomycin-Bacitracin Zn-Polymyx] Rash    Patient Measurements: Height: 5\' 6"  (167.6 cm) Weight: 175 lb 11.3 oz (79.7 kg) IBW/kg (Calculated) : 59.3  Vital Signs: Temp: 97.7 F (36.5 C) (11/22 1400) Temp Source: Oral (11/22 1400) BP: 129/65 mmHg (11/22 1400) Pulse Rate: 84 (11/22 1400) Intake/Output from previous day:   Intake/Output from this shift:    Labs: No results for input(s): WBC, HGB, PLT, LABCREA, CREATININE in the last 72 hours. CrCl cannot be calculated (Patient has no serum creatinine result on file.). No results for input(s): VANCOTROUGH, VANCOPEAK, VANCORANDOM, GENTTROUGH, GENTPEAK, GENTRANDOM, TOBRATROUGH, TOBRAPEAK, TOBRARND, AMIKACINPEAK, AMIKACINTROU, AMIKACIN in the last 72 hours.   Microbiology: No results found for this or any previous visit (from the past 720 hour(s)).  Medical History: Past Medical History  Diagnosis Date  . PSP (progressive supranuclear palsy) ([place]) 12/09    neuro Dr.Willis, initially dx as parkinson, dx changed to PSP in 2010  . Depression   . GERD (gastroesophageal reflux disease)   . Osteoporosis   . Hyperlipidemia   . Hypothyroidism   . Hypertension   . Chronic back pain   . Hoarseness   . Colonic polyp   . Postmenopausal status   . SOB (shortness of breath)     s/p a negative Cardio-pulmonary stress test 08-27-04, normal PFT s 2006    . Allergic rhinitis   . Obese     Moderate  . Breast CA ([place]) 1996  . Melanoma ([place]) 2006    in situ nose,s/p mohs surgery  . Headache(784.0)     migraines   . Severe dysarthria 12/23/2014    Assessment: 42 yoF seen by PCP today with symptoms of lethargy, weakness, low-grade fever, cough. CXR confirmed bilateral pneumonia.  Will be admitted  for treatment.  Azithromycin and Ceftriaxone per pharmacy.  11/22 >> Azithromycin 500 mg IV q24h >> 11/22 >> Ceftriaxone 1g IV q24h >>  Goal of Therapy:  Eradication of infection  Plan:  Ceftriaxone 1g IV q24h. Azithromycin 500 mg IV q24h dosing appropriate. Since these antibiotics do not need renal adjustment, need for further dosage adjustment appears unlikely at present.    Will sign off at this time.  Please reconsult if a change in clinical status warrants re-evaluation of dosage.   Hershal Coria 04/08/2015,3:58 PM

## 2015-04-08 NOTE — Progress Notes (Signed)
Pre visit review using our clinic review tool, if applicable. No additional management support is needed unless otherwise documented below in the visit note. 

## 2015-04-09 DIAGNOSIS — F329 Major depressive disorder, single episode, unspecified: Secondary | ICD-10-CM

## 2015-04-09 DIAGNOSIS — I10 Essential (primary) hypertension: Secondary | ICD-10-CM

## 2015-04-09 DIAGNOSIS — Z853 Personal history of malignant neoplasm of breast: Secondary | ICD-10-CM

## 2015-04-09 LAB — BASIC METABOLIC PANEL
Anion gap: 12 (ref 5–15)
BUN: 13 mg/dL (ref 6–20)
CALCIUM: 8.4 mg/dL — AB (ref 8.9–10.3)
CHLORIDE: 104 mmol/L (ref 101–111)
CO2: 22 mmol/L (ref 22–32)
CREATININE: 0.66 mg/dL (ref 0.44–1.00)
GLUCOSE: 119 mg/dL — AB (ref 65–99)
Potassium: 3.2 mmol/L — ABNORMAL LOW (ref 3.5–5.1)
SODIUM: 138 mmol/L (ref 135–145)

## 2015-04-09 LAB — CBC
HCT: 25.2 % — ABNORMAL LOW (ref 36.0–46.0)
HEMOGLOBIN: 8.2 g/dL — AB (ref 12.0–15.0)
MCH: 26.1 pg (ref 26.0–34.0)
MCHC: 32.5 g/dL (ref 30.0–36.0)
MCV: 80.3 fL (ref 78.0–100.0)
PLATELETS: 678 10*3/uL — AB (ref 150–400)
RBC: 3.14 MIL/uL — ABNORMAL LOW (ref 3.87–5.11)
RDW: 15.5 % (ref 11.5–15.5)
WBC: 26.7 10*3/uL — ABNORMAL HIGH (ref 4.0–10.5)

## 2015-04-09 LAB — HIV ANTIBODY (ROUTINE TESTING W REFLEX): HIV Screen 4th Generation wRfx: NONREACTIVE

## 2015-04-09 MED ORDER — PIPERACILLIN-TAZOBACTAM 3.375 G IVPB
3.3750 g | Freq: Three times a day (TID) | INTRAVENOUS | Status: DC
  Administered 2015-04-09 – 2015-04-12 (×9): 3.375 g via INTRAVENOUS
  Filled 2015-04-09 (×10): qty 50

## 2015-04-09 MED ORDER — VANCOMYCIN HCL IN DEXTROSE 1-5 GM/200ML-% IV SOLN
1000.0000 mg | Freq: Two times a day (BID) | INTRAVENOUS | Status: AC
  Administered 2015-04-09 – 2015-04-11 (×5): 1000 mg via INTRAVENOUS
  Filled 2015-04-09 (×5): qty 200

## 2015-04-09 MED ORDER — POTASSIUM CHLORIDE CRYS ER 20 MEQ PO TBCR
40.0000 meq | EXTENDED_RELEASE_TABLET | Freq: Once | ORAL | Status: AC
  Administered 2015-04-09: 40 meq via ORAL
  Filled 2015-04-09: qty 2

## 2015-04-09 NOTE — Care Management Note (Signed)
Case Management Note  Patient Details  Name: Emily Mccullough MRN: [id number] Date of Birth: May 01, 1946  Subjective/Objective:   69 y/o f admitted w/PNA. From home. PT-recc HHPT. Will provide patient w/HHC agency provider list await choice.                 Action/Plan:d/c plan home w/HHPT.   Expected Discharge Date:   (UNKNOWN)               Expected Discharge Plan:  [place]  In-House Referral:     Discharge planning Services  CM Consult  Post Acute Care Choice:    Choice offered to:  Patient  DME Arranged:    DME Agency:     HH Arranged:    [place] Agency:     Status of Service:  In process, will continue to follow  Medicare Important Message Given:    Date Medicare IM Given:    Medicare IM give by:    Date Additional Medicare IM Given:    Additional Medicare Important Message give by:     If discussed at [place] of Stay Meetings, dates discussed:    Additional Comments:  Dessa Phi, RN 04/09/2015, 1:57 PM

## 2015-04-09 NOTE — Evaluation (Signed)
Occupational Therapy Evaluation Patient Details Name: Emily Mccullough MRN: [id number] DOB: 1945-09-02 Today's Date: 04/09/2015    History of Present Illness 69 yo female admitted with pneumonia, has a history of depression, hypertension, hypothyroidism, and PSP (progressive supranuclear palsy).    Clinical Impression   Pt admitted with above. She demonstrates the below listed deficits and will benefit from continued OT to maximize safety and independence with BADLs.  Pt presents to OT with generalized weakness.   Currently, she requires total A for all aspects of ADLs, and max - total A for functional activity.  She fatigues quite rapidly with EOB sitting - sat 89-92% on RA, and dyspnea 3//- 4/4.  Family reports she required supervision - min A for functional transfers (used power w/c), and required min - mod A for ADLs so demonstrates a SIGNIFICANT decline in function over the past 2 weeks.  Family also reports frequent falls with frequent EMS calls.   Spouse does not feel he can safely manage her at home, therefore, recommend SNF level rehab.        Follow Up Recommendations  SNF    Equipment Recommendations  Hospital bed;Other (comment) (hoyer lift )    Recommendations for Other Services       Precautions / Restrictions Precautions Precautions: Fall Precaution Comments: h/o falls which have increased over the past 2 weeks       Mobility Bed Mobility Overal bed mobility: Needs Assistance Bed Mobility: Supine to Sit;Sit to Supine     Supine to sit: Max assist Sit to supine: Total assist   General bed mobility comments: Pt very slow to initiate activity.  She is able to assist minimally with moving LEs off bed and with lifting trunk from bed   Transfers                 General transfer comment: Pt unable to tolerate     Balance Overall balance assessment: Needs assistance Sitting-balance support: Feet supported Sitting balance-Leahy Scale: Poor Sitting  balance - Comments: Sat EOB with min guard to min A with UE support x 4 mins.  RR increased with sats 89-92% on RA.  Pt fatigues quickly                                     ADL Overall ADL's : Needs assistance/impaired Eating/Feeding: Total assistance;Bed level   Grooming: Wash/dry hands;Wash/dry face;Oral care;Total assistance;Bed level   Upper Body Bathing: Total assistance;Bed level   Lower Body Bathing: Total assistance;Bed level   Upper Body Dressing : Total assistance;Bed level   Lower Body Dressing: Total assistance;Bed level   Toilet Transfer: Total assistance Toilet Transfer Details (indicate cue type and reason): unable to attempt  Toileting- Clothing Manipulation and Hygiene: Total assistance;Bed level       Functional mobility during ADLs: Total assistance;Maximal assistance General ADL Comments: Pt lethargic, but willing to work with OT.  She is very slow to respond and to follow commands.   She will attempt to move hair from her face, but is unable to do so due to weakness      Vision Additional Comments: Pt too fatigued to participate in visual assessment    Perception     Praxis      Pertinent Vitals/Pain Pain Assessment: Faces Pain Score: 3  Faces Pain Scale: Hurts a little bit Pain Location: back  Pain Descriptors / Indicators: Aching Pain Intervention(s):  Monitored during session;Repositioned     Hand Dominance Right   Extremity/Trunk Assessment Upper Extremity Assessment Upper Extremity Assessment: RUE deficits/detail;LUE deficits/detail RUE Deficits / Details: strength bil. grossly 2+/5 - 3/5  RUE Coordination: decreased fine motor;decreased gross motor LUE Deficits / Details: strength bil. grossly 2+/5 - 3/5   Lower Extremity Assessment Lower Extremity Assessment: Defer to PT evaluation   Cervical / Trunk Assessment Cervical / Trunk Assessment: Other exceptions Cervical / Trunk Exceptions: Pt maintains passively flexed  trunk with decreased activation of trunk musculature    Communication Communication Communication: Expressive difficulties (dysarthria )   Cognition Arousal/Alertness: Lethargic Behavior During Therapy: Flat affect Overall Cognitive Status: Difficult to assess                     General Comments       Exercises       Shoulder Instructions      Home Living Family/patient expects to be discharged to:: Private residence Living Arrangements: Spouse/significant other Available Help at Discharge: Family;Available 24 hours/day Type of Home: House             Bathroom Shower/Tub: Walk-in shower         Home Equipment: Wheelchair - power;Tub bench;Bedside commode   Additional Comments: Per spouse and brother, pt falls requiring brother to assist her from floor or for spouse to call EMS      Prior Functioning/Environment Level of Independence: Needs assistance  Gait / Transfers Assistance Needed: pt was non ambulatory.  Used power w/c.  She was able to transfer with supervision to min A, with occasional falls, but falls have increased significantly over the past 2 weeks  ADL's / Homemaking Assistance Needed: Per spouse, pt was able to bathe self, able to don "dress" most days, but some days requires mod A.  She requires mod A for slip on shoes.  She performs toilet transfer and hygiend with min A to supervision  Communication / Swallowing Assistance Needed: Per sister in law, pt dysarthric with difficulty communicating.  She often used her kindle to communicate       OT Diagnosis: Generalized weakness   OT Problem List: Decreased strength;Decreased range of motion;Decreased activity tolerance;Impaired balance (sitting and/or standing);Decreased coordination;Decreased safety awareness;Decreased knowledge of use of DME or AE;Obesity;Impaired UE functional use   OT Treatment/Interventions: Self-care/ADL training;Therapeutic exercise;Neuromuscular education;DME and/or AE  instruction;Therapeutic activities;Patient/family education;Balance training    OT Goals(Current goals can be found in the care plan section) Acute Rehab OT Goals Patient Stated Goal: to get stronger  OT Goal Formulation: With patient/family Time For Goal Achievement: 04/23/15 Potential to Achieve Goals: Fair ADL Goals Pt Will Perform Grooming: with min assist;sitting Pt Will Perform Upper Body Bathing: with mod assist;sitting Pt Will Transfer to Toilet: with mod assist;bedside commode Pt Will Perform Toileting - Clothing Manipulation and hygiene: with max assist;sit to/from stand Pt/caregiver will Perform Home Exercise Program: Increased ROM;Right Upper extremity;Left upper extremity;With Supervision;With written HEP provided Additional ADL Goal #1: Pt will sit EOB x 10 mins with min A in prep for ADLs.   OT Frequency: Min 2X/week   Barriers to D/C: Decreased caregiver support  spouse unable to provide necessary level of care needed        Co-evaluation              End of Session Nurse Communication: Mobility status  Activity Tolerance: Patient limited by fatigue;Patient limited by lethargy Patient left: in bed;with call bell/phone within reach;with family/visitor present  Time: [id number] OT Time Calculation (min): 55 min Charges:  OT General Charges $OT Visit: 1 Procedure OT Evaluation $Initial OT Evaluation Tier I: 1 Procedure OT Treatments $Self Care/Home Management : 23-37 mins $Therapeutic Activity: 8-22 mins G-Codes:    Samoria Fedorko M 2015-05-08, 4:22 PM

## 2015-04-09 NOTE — Evaluation (Signed)
Clinical/Bedside Swallow Evaluation Patient Details  Name: Emily Mccullough MRN: [id number] Date of Birth: 01-07-1946  Today's Date: 04/09/2015 Time: SLP Start Time (ACUTE ONLY): [id number] SLP Stop Time (ACUTE ONLY): 1310 SLP Time Calculation (min) (ACUTE ONLY): 35 min  Past Medical History:  Past Medical History  Diagnosis Date  . PSP (progressive supranuclear palsy) ([place]) 12/09    neuro Dr.Willis, initially dx as parkinson, dx changed to PSP in 2010  . Depression   . GERD (gastroesophageal reflux disease)   . Osteoporosis   . Hyperlipidemia   . Hypothyroidism   . Hypertension   . Chronic back pain   . Hoarseness   . Colonic polyp   . Postmenopausal status   . SOB (shortness of breath)     s/p a negative Cardio-pulmonary stress test 08-27-04, normal PFT s 2006    . Allergic rhinitis   . Obese     Moderate  . Breast CA ([place]) 1996  . Melanoma ([place]) 2006    in situ nose,s/p mohs surgery  . Headache(784.0)     migraines   . Severe dysarthria 12/23/2014   Past Surgical History:  Past Surgical History  Procedure Laterality Date  . Mastectomy    . Total abdominal hysterectomy w/ bilateral salpingoophorectomy  1990  . Fracture arm    . Spinal fusion  10/24/07    6 screws  . Cataract extraction      right   HPI:  Pt is a 69 yo female with h/o of depression, hypertension, hypothyroidism, dysphagia PSP presented as a direct admission from Dr. Larose Kells. Patient presented to his office today after feeling poorly with cough for the past 3 weeks. Patient was reportedly weak and sleepy with a low-grade fever at home. While at her PCPs office today, chest x-ray conducted showing pneumonia.   Assessment / Plan / Recommendation Clinical Impression  Pt presents with symptoms of dysphagia and concern for aspiration across all consistencies provided likely due to her PSP.  Cough noted with HOB lowering to reposition pt, ? aspiration of secretions or pna related.  Limited po offered due to aspiration  concerns.  Immediate strong cough noted after swallow of thin via straw suggestive of aspiration.   Much better tolerance of liquids via single tsp only.  Multiple swallows across all consistencies - concerning for possible pharyngeal residuals.   Although pt does have baseline cough from her pna, she is consistently coughing with po intake.  Given her PSP,  current pna, h/o dysphagia x3 weeks (coughing with liquids per pt), and concerns for aspiration,  MBS indicated to allow instrumental evaluation.  Will modify diet to decrease aspiration risk.   Pt educated to findings and recommendations.      Aspiration Risk  Severe aspiration risk    Diet Recommendation Thin liquid (tsps of thin )   Liquid Administration via: Spoon Medication Administration: Whole meds with puree Supervision: Patient able to self feed Compensations: Slow rate;Small sips/bites Postural Changes: Seated upright at 90 degrees    Other  Recommendations Oral Care Recommendations: Oral care BID   Follow up Recommendations    TBD   Frequency and Duration min 1 x/week  1 week       Prognosis Prognosis for Safe Diet Advancement: Guarded Barriers to Reach Goals: Severity of deficits;Time post onset      Swallow Study   General Date of Onset: 04/09/15 HPI: Pt is a 69 yo female with h/o of depression, hypertension, hypothyroidism, dysphagia PSP presented as a  direct admission from Dr. Larose Kells. Patient presented to his office today after feeling poorly with cough for the past 3 weeks. Patient was reportedly weak and sleepy with a low-grade fever at home. While at her PCPs office today, chest x-ray conducted showing pneumonia. Type of Study: Bedside Swallow Evaluation Previous Swallow Assessment: MBS 04/30/2010 - no results available but per referring MD note, thin liquids recommended  Diet Prior to this Study: Regular;Thin liquids Temperature Spikes Noted: No Respiratory Status: Room air History of Recent Intubation:  No Behavior/Cognition: Alert Oral Cavity Assessment: Dry Oral Care Completed by SLP: No Vision: Functional for self-feeding Self-Feeding Abilities: Needs assist Patient Positioning: Upright in bed Baseline Vocal Quality: Breathy;Low vocal intensity;Suspected CN X (Vagus) involvement (pt can verbalize 2-3 words per breath group) Volitional Cough: Weak Volitional Swallow: Able to elicit    Oral/Motor/Sensory Function Overall Oral Motor/Sensory Function: Generalized oral weakness (significant weakness) Velum: Other (comment);Suspected CN X (Vagus) dysfunction (sluggish elevation but bilateral)   Ice Chips Ice chips: Not tested   Thin Liquid Thin Liquid: Impaired Presentation: Cup;Spoon;Straw;Self Fed Oral Phase Impairments: Reduced lingual movement/coordination Oral Phase Functional Implications: Prolonged oral transit;Other (comment) Pharyngeal  Phase Impairments: Cough - Immediate Other Comments: overt coughing immediately after swallow of thin via straw - pt admits she "strangled"    Nectar Thick Nectar Thick Liquid: Impaired Presentation: Cup Oral Phase Impairments: Reduced lingual movement/coordination Pharyngeal Phase Impairments: Suspected delayed Swallow;Multiple swallows   Honey Thick Honey Thick Liquid: Not tested   Puree Puree: Impaired Presentation: Spoon Oral Phase Impairments: Reduced lingual movement/coordination Oral Phase Functional Implications: Prolonged oral transit Pharyngeal Phase Impairments: Suspected delayed Swallow;Multiple swallows;Cough - Delayed;Cough - Immediate   Solid Solid: Not tested Other Comments: due to aspiration concerns       Claudie Fisherman, [facility] SLP [phone]

## 2015-04-09 NOTE — Progress Notes (Signed)
[facility] PROGRESS NOTE  Emily Mccullough [id number] DOB: 1945/08/05 DOA: 04/08/2015 PCP: Kathlene November, MD  HPI/Brief narrative 69 y.o. female with a history of depression, hypertension, hypothyroidism, PSP presented as a direct admission from Dr. Larose Kells. Patient presented to his office today after feeling poorly with cough for the past 3 weeks prior to admission  Assessment/Plan: Bibasilar pneumonia with sepsis on admission -Chest x-ray showed bilateral pneumonia with small pleural effusion on the left; radiology recommendations for repeat chest x-ray in 3-4 weeks -Patient was started on empiric azithromycin and ceftriaxone -Pending blood cultures, sputum culture, Legionella and strep pneumonia antigens -See below - concerns for aspiration. Will change regimen to vancomycin and zosyn  Hypothyroidism -Continue Synthroid  Hypertension -Continue losartan -Lasix held until labs obtained  History of breast cancer and melanoma -Stable  Depression -Continue Pamelor  GERD -Continue Protonix  Generalized weakness -Consulted PT and OT  History of PSP -Follows with Dr. Jannifer Franklin, neurology  Code Status: Full Family Communication: Pt in room Disposition Plan: Home pending resolution of sepsis and when pt able to tolerate PO meds   Consultants:    Procedures:    Antibiotics: Anti-infectives    Start     Dose/Rate Route Frequency Ordered Stop   04/08/15 1800  cefTRIAXone (ROCEPHIN) 1 g in dextrose 5 % 50 mL IVPB  Status:  Discontinued     1 g 100 mL/hr over 30 Minutes Intravenous Every 24 hours 04/08/15 1604 04/09/15 1452   04/08/15 1545  azithromycin (ZITHROMAX) 500 mg in dextrose 5 % 250 mL IVPB  Status:  Discontinued     500 mg 250 mL/hr over 60 Minutes Intravenous Every 24 hours 04/08/15 1535 04/09/15 1452      HPI/Subjective: Confused this AM, unable to obtain  Objective: Filed Vitals:   04/08/15 1441 04/08/15 2051 04/09/15 0621 04/09/15 1338  BP:   127/58 138/52 115/90  Pulse:  100 101 99  Temp:  98 F (36.7 C) 100.2 F (37.9 C) 98.2 F (36.8 C)  TempSrc:  Oral Oral Oral  Resp:  18 24 20   Height: 5\' 6"  (1.676 m)     Weight: 79.7 kg (175 lb 11.3 oz)     SpO2:  95% 95% 96%    Intake/Output Summary (Last 24 hours) at 04/09/15 1455 Last data filed at 04/09/15 0920  Gross per 24 hour  Intake    610 ml  Output      0 ml  Net    610 ml   Filed Weights   04/08/15 1441  Weight: 79.7 kg (175 lb 11.3 oz)    Exam:   General:  Awake, in nad  Cardiovascular: regular, s1, s2  Respiratory: normal resp effort, no wheezing  Abdomen: soft,nondistended  Musculoskeletal: perfused, no clubbing   Data Reviewed: Basic Metabolic Panel:  Recent Labs Lab 04/08/15 1600 04/09/15 0535  NA 142 138  K 3.6 3.2*  CL 107 104  CO2 23 22  GLUCOSE 112* 119*  BUN 16 13  CREATININE 0.59 0.66  CALCIUM 8.8* 8.4*   Liver Function Tests:  Recent Labs Lab 04/08/15 1600  AST 82*  ALT 140*  ALKPHOS 203*  BILITOT 0.9  PROT 7.2  ALBUMIN 2.6*   No results for input(s): LIPASE, AMYLASE in the last 168 hours. No results for input(s): AMMONIA in the last 168 hours. CBC:  Recent Labs Lab 04/08/15 1600 04/09/15 0535  WBC 28.9* 26.7*  HGB 7.7* 8.2*  HCT 23.6* 25.2*  MCV 81.4 80.3  PLT  621* 678*   Cardiac Enzymes: No results for input(s): CKTOTAL, CKMB, CKMBINDEX, TROPONINI in the last 168 hours. BNP (last 3 results) No results for input(s): BNP in the last 8760 hours.  ProBNP (last 3 results) No results for input(s): PROBNP in the last 8760 hours.  CBG: No results for input(s): GLUCAP in the last 168 hours.  Recent Results (from the past 240 hour(s))  Culture, blood (routine x 2) Call MD if unable to obtain prior to antibiotics being given     Status: None (Preliminary result)   Collection Time: 04/08/15  3:55 PM  Result Value Ref Range Status   Specimen Description BLOOD LEFT ANTECUBITAL  Final   Special Requests  BOTTLES DRAWN AEROBIC AND ANAEROBIC 5 CC EA  Final   Culture   Final    NO GROWTH < 24 HOURS Performed at [facility]    Report Status PENDING  Incomplete  Culture, blood (routine x 2) Call MD if unable to obtain prior to antibiotics being given     Status: None (Preliminary result)   Collection Time: 04/08/15  4:10 PM  Result Value Ref Range Status   Specimen Description BLOOD LEFT HAND  Final   Special Requests BOTTLES DRAWN AEROBIC AND ANAEROBIC 3 CC EA  Final   Culture   Final    NO GROWTH < 24 HOURS Performed at [facility]    Report Status PENDING  Incomplete     Studies: Dg Chest 2 View  04/08/2015  CLINICAL DATA:  Cough and fever.  Evaluate for pneumonia EXAM: CHEST  2 VIEW COMPARISON:  05/15/2008 FINDINGS: Dense airspace disease at the left base with pleural effusion. No cavitary finding. There is likely also airspace disease at the medial right base. No edema or air leak. Normal non obscured heart size. IMPRESSION: 1. Bibasilar pneumonia, more extensive on the left where there is small pleural effusion. 2. Followup PA and lateral chest X-ray is recommended in 3-4 weeks following trial of antibiotic therapy to ensure resolution and exclude underlying malignancy. Electronically Signed   By: Monte Fantasia M.D.   On: 04/08/2015 12:29    Scheduled Meds: . levothyroxine  75 mcg Oral QAC breakfast  . losartan  100 mg Oral Daily  . multivitamin with minerals  1 tablet Oral Daily  . nortriptyline  25 mg Oral QHS  . pantoprazole  40 mg Oral BID  . sodium chloride  3 mL Intravenous Q12H   Continuous Infusions:   Principal Problem:   Pneumonia Active Problems:   Hypothyroidism   Depression   PSP (progressive supranuclear palsy) ([place])   Essential hypertension   GERD   BREAST CANCER, HX OF   Shalese Strahan K  [facility] Pager [phone]. If 7PM-7AM, please contact night-coverage at [email or web address], password [facility] 04/09/2015, 2:55 PM  LOS: 1 day

## 2015-04-09 NOTE — Care Management Note (Signed)
Case Management Note  Patient Details  Name: Emily Mccullough MRN: [id number] Date of Birth: 1945-07-18  Subjective/Objective:   Spoke to spouse in rm about [facility] chosen [facility]-aware of referral. Await HHPT order, [id number]. Spouse concerned about a hospital bed-he will talk to attending to confirm if needed.Ambulance transp @ d/c-CSW notified.                 Action/Plan:d/c plan home w/HHC.   Expected Discharge Date:   (UNKNOWN)               Expected Discharge Plan:  [place]  In-House Referral:     Discharge planning Services  CM Consult  Post Acute Care Choice:    Choice offered to:  Spouse  DME Arranged:    DME Agency:     HH Arranged:    [place] Agency:  [place]  Status of Service:  In process, will continue to follow  Medicare Important Message Given:    Date Medicare IM Given:    Medicare IM give by:    Date Additional Medicare IM Given:    Additional Medicare Important Message give by:     If discussed at [place] of Stay Meetings, dates discussed:    Additional Comments:  Dessa Phi, RN 04/09/2015, 2:26 PM

## 2015-04-09 NOTE — Progress Notes (Signed)
ANTIBIOTIC CONSULT NOTE - INITIAL  Pharmacy Consult for Vancomycin & Zosyn Indication: pneumonia  Allergies  Allergen Reactions  . Zostavax [Zoster Vaccine Live]     Unable to take d/t Neosporin allergy  . Neosporin [Neomycin-Bacitracin Zn-Polymyx] Rash    Patient Measurements: Height: 5\' 6"  (167.6 cm) Weight: 175 lb 11.3 oz (79.7 kg) IBW/kg (Calculated) : 59.3  Vital Signs: Temp: 98.2 F (36.8 C) (11/23 1338) Temp Source: Oral (11/23 1338) BP: 115/90 mmHg (11/23 1338) Pulse Rate: 99 (11/23 1338) Intake/Output from previous day: 11/22 0701 - 11/23 0700 In: 490 [P.O.:240; IV Piggyback:250] Out: -  Intake/Output from this shift: Total I/O In: 120 [P.O.:120] Out: -   Labs:  Recent Labs  04/08/15 1600 04/09/15 0535  WBC 28.9* 26.7*  HGB 7.7* 8.2*  PLT 621* 678*  CREATININE 0.59 0.66   Estimated Creatinine Clearance: 69.7 mL/min (by C-G formula based on Cr of 0.66). No results for input(s): VANCOTROUGH, VANCOPEAK, VANCORANDOM, GENTTROUGH, GENTPEAK, GENTRANDOM, TOBRATROUGH, TOBRAPEAK, TOBRARND, AMIKACINPEAK, AMIKACINTROU, AMIKACIN in the last 72 hours.   Microbiology: Recent Results (from the past 720 hour(s))  Culture, blood (routine x 2) Call MD if unable to obtain prior to antibiotics being given     Status: None (Preliminary result)   Collection Time: 04/08/15  3:55 PM  Result Value Ref Range Status   Specimen Description BLOOD LEFT ANTECUBITAL  Final   Special Requests BOTTLES DRAWN AEROBIC AND ANAEROBIC 5 CC EA  Final   Culture   Final    NO GROWTH < 24 HOURS Performed at [facility]    Report Status PENDING  Incomplete  Culture, blood (routine x 2) Call MD if unable to obtain prior to antibiotics being given     Status: None (Preliminary result)   Collection Time: 04/08/15  4:10 PM  Result Value Ref Range Status   Specimen Description BLOOD LEFT HAND  Final   Special Requests BOTTLES DRAWN AEROBIC AND ANAEROBIC 3 CC EA  Final   Culture   Final     NO GROWTH < 24 HOURS Performed at [facility]    Report Status PENDING  Incomplete    Medical History: Past Medical History  Diagnosis Date  . PSP (progressive supranuclear palsy) ([place]) 12/09    neuro Dr.Willis, initially dx as parkinson, dx changed to PSP in 2010  . Depression   . GERD (gastroesophageal reflux disease)   . Osteoporosis   . Hyperlipidemia   . Hypothyroidism   . Hypertension   . Chronic back pain   . Hoarseness   . Colonic polyp   . Postmenopausal status   . SOB (shortness of breath)     s/p a negative Cardio-pulmonary stress test 08-27-04, normal PFT s 2006    . Allergic rhinitis   . Obese     Moderate  . Breast CA ([place]) 1996  . Melanoma ([place]) 2006    in situ nose,s/p mohs surgery  . Headache(784.0)     migraines   . Severe dysarthria 12/23/2014    Assessment: 69 yoF seen by PCP 11/22 with symptoms of lethargy, weakness, low-grade fever, cough. CXR confirmed bilateral pneumonia.  She was admitted for treatment with IV Azithromycin and Ceftriaxone.  She remains afebrile and WBC trending down.  Concern for aspiration so MD requests broadened coverage to Vancomycin & Zosyn.  11/22 >> Azithromycin 500 mg IV q24h >> 11/23 11/22 >> Ceftriaxone 1g IV q24h >> 11/23 11/23>>Vancomycin >> 11/23>>Zosyn >>  Micro data:   11/22: Blood  x2: NGTD  Goal of Therapy:  Eradication of infection Vancomycin trough 15-20 mcg/ml  Plan:  Zosyn 3.375gm IV Q8h to be infused over 4hrs Vancomycin 1gm IV q12h Check Vancomycin trough at steady state Monitor renal function and cx data  **Duration of therapy per MD.  For CAP + aspiration PNA coverage consider de-escalate antibiotics to Unasyn + Zithromax.    Emily Mccullough 04/09/2015,3:43 PM

## 2015-04-09 NOTE — Evaluation (Signed)
Physical Therapy Evaluation Patient Details Name: Emily Mccullough MRN: [id number] DOB: 10/27/1945 Today's Date: 04/09/2015   History of Present Illness  69 yo female admitted with pneumonia, has a history of depression, hypertension, hypothyroidism, and PSP (progressive supranuclear palsy).   Clinical Impression  Pt admitted with above diagnosis. Pt currently with functional limitations due to the deficits listed below (see PT Problem List). Pt will benefit from skilled PT to increase their independence and safety with mobility to allow discharge to the venue listed below. Pt required total assist for transfers and max assist for bed mobility mostly d/t poor motor control and low muscle tone. Pt possibly at her baseline due to the nature of progressive neurological disease, however, no relatives at bedside to assess PLOF. Recommend HHPT with 24 hour assist if family can provide it, otherwise would benefit from SNF.          Follow Up Recommendations Home health PT;Supervision for mobility/OOB;Supervision/Assistance - 24 hour    Equipment Recommendations  None recommended by PT    Recommendations for Other Services       Precautions / Restrictions Precautions Precautions: Fall Restrictions Weight Bearing Restrictions: No      Mobility  Bed Mobility Overal bed mobility: Needs Assistance;+2 for physical assistance Bed Mobility: Supine to Sit;Sit to Supine     Supine to sit: +2 for physical assistance;Max assist Sit to supine: +2 for physical assistance;Max assist   General bed mobility comments: Pt attempted to peform bed mobility, requires max cues for correct technique and max assist to sit up, for both LE's, and scoot to EOB. Observed wheezing sounds and increased work of breathing, pt's sats remained at >89% on RA at all times during therapy session, checked several times before and after mobilizing pt.   Transfers Overall transfer level: Needs assistance   Transfers: Sit  to/from Stand Sit to Stand: +2 physical assistance;Total assist         General transfer comment: therapist attempted to perform stand pivot transfer, however, pt is not mobilizing their LEs, sat at EOB and performed leg exercises and sitting balance; pt required total assist from student PT and stand by assist from 2nd PT to assure safety; unable to determine if this is pt's baseline d/t no spouse at bedside   Ambulation/Gait             General Gait Details: pt non-ambulatory for >2 years, per pt  Stairs            Wheelchair Mobility    Modified Rankin (Stroke Patients Only)       Balance Overall balance assessment: Needs assistance Sitting-balance support: Bilateral upper extremity supported;Feet supported Sitting balance-Leahy Scale: Poor Sitting balance - Comments: difficulties peforming sitting balance d/t low tone of core musculature; slumped posture d/t low tone of back extensors      Standing balance-Leahy Scale: Zero                               Pertinent Vitals/Pain Pain Assessment: Faces Faces Pain Scale: Hurts little more Pain Location: points to L rib cage area Pain Intervention(s): Limited activity within patient's tolerance;Monitored during session;Repositioned    Home Living Family/patient expects to be discharged to:: Private residence Living Arrangements: Spouse/significant other               Additional Comments: pt poor historian, no family present    Prior Function  Comments: uncertain of baseline however per chart review, progressive decline over last few weeks with mobility, falls     Hand Dominance        Extremity/Trunk Assessment   Upper Extremity Assessment: Generalized weakness;RUE deficits/detail;LUE deficits/detail RUE Deficits / Details: weakness related to low muscle tone and poor motor control, able to flex UE against gravity, however, lacks full AROM, strength ~ 3-/5     LUE  Deficits / Details: weakness related to low muscle tone and poor motor control, able to flex UE against gravity, however, lacks full AROM, strength ~ 3-/5   Lower Extremity Assessment: Generalized weakness;RLE deficits/detail;LLE deficits/detail RLE Deficits / Details: weakness related to low muscle tone and poor motor control, able to flex/against against gravity with AROM WFL, overall strength 3+/5 LLE Deficits / Details: weakness related to low muscle tone and poor motor control, able to flex/ extend against gravity with AROM WFL, general strength ~3+/5  Cervical / Trunk Assessment: Other exceptions  Communication   Communication: Expressive difficulties  Cognition Arousal/Alertness: Awake/alert Behavior During Therapy: Flat affect Overall Cognitive Status: No family/caregiver present to determine baseline cognitive functioning Area of Impairment: Following commands       Following Commands: Follows one step commands with increased time            General Comments      Exercises General Exercises - Upper Extremity Shoulder Flexion: AROM;Both;5 reps;Supine General Exercises - Lower Extremity Ankle Circles/Pumps: AROM;Both;10 reps;Seated Long Arc Quad: AROM;Strengthening;Both;5 reps;Seated Hip Flexion/Marching: AROM;Both;5 reps;Seated Toe Raises: AROM;Both;10 reps;Seated      Assessment/Plan    PT Assessment Patient needs continued PT services  PT Diagnosis Generalized weakness;Difficulty walking   PT Problem List Decreased strength;Decreased range of motion;Decreased activity tolerance;Decreased balance;Decreased mobility;Decreased coordination;Decreased safety awareness;Decreased knowledge of use of DME  PT Treatment Interventions DME instruction;Functional mobility training;Therapeutic activities;Therapeutic exercise;Balance training;Patient/family education   PT Goals (Current goals can be found in the Care Plan section) Acute Rehab PT Goals Patient Stated Goal: none  stated PT Goal Formulation: With patient Time For Goal Achievement: 04/25/15 Potential to Achieve Goals: Good    Frequency Min 3X/week   Barriers to discharge        Co-evaluation               End of Session Equipment Utilized During Treatment: Gait belt Activity Tolerance: Patient tolerated treatment well Patient left: in bed;with call bell/phone within reach;with bed alarm set Nurse Communication: Mobility status         Time: [id number] PT Time Calculation (min) (ACUTE ONLY): 29 min   Charges:   PT Evaluation $Initial PT Evaluation Tier I: 1 Procedure PT Treatments $Therapeutic Activity: 8-22 mins   PT G Codes:        Rahshawn Remo, SPT 04-20-2015, 1:40 PM

## 2015-04-10 ENCOUNTER — Inpatient Hospital Stay (HOSPITAL_COMMUNITY): Payer: Medicare Other

## 2015-04-10 DIAGNOSIS — G231 Progressive supranuclear ophthalmoplegia [Steele-Richardson-Olszewski]: Secondary | ICD-10-CM

## 2015-04-10 LAB — BASIC METABOLIC PANEL
Anion gap: 11 (ref 5–15)
BUN: 14 mg/dL (ref 6–20)
CHLORIDE: 107 mmol/L (ref 101–111)
CO2: 23 mmol/L (ref 22–32)
Calcium: 8.3 mg/dL — ABNORMAL LOW (ref 8.9–10.3)
Creatinine, Ser: 0.61 mg/dL (ref 0.44–1.00)
Glucose, Bld: 119 mg/dL — ABNORMAL HIGH (ref 65–99)
POTASSIUM: 3.1 mmol/L — AB (ref 3.5–5.1)
SODIUM: 141 mmol/L (ref 135–145)

## 2015-04-10 LAB — LACTIC ACID, PLASMA
LACTIC ACID, VENOUS: 2.8 mmol/L — AB (ref 0.5–2.0)
Lactic Acid, Venous: 1 mmol/L (ref 0.5–2.0)

## 2015-04-10 LAB — PROCALCITONIN: PROCALCITONIN: 0.46 ng/mL

## 2015-04-10 LAB — CBC
HEMATOCRIT: 24 % — AB (ref 36.0–46.0)
Hemoglobin: 7.8 g/dL — ABNORMAL LOW (ref 12.0–15.0)
MCH: 25.7 pg — ABNORMAL LOW (ref 26.0–34.0)
MCHC: 32.5 g/dL (ref 30.0–36.0)
MCV: 78.9 fL (ref 78.0–100.0)
PLATELETS: 639 10*3/uL — AB (ref 150–400)
RBC: 3.04 MIL/uL — AB (ref 3.87–5.11)
RDW: 15.5 % (ref 11.5–15.5)
WBC: 25.5 10*3/uL — AB (ref 4.0–10.5)

## 2015-04-10 LAB — MAGNESIUM: MAGNESIUM: 2 mg/dL (ref 1.7–2.4)

## 2015-04-10 MED ORDER — ALBUTEROL SULFATE (2.5 MG/3ML) 0.083% IN NEBU
2.5000 mg | INHALATION_SOLUTION | RESPIRATORY_TRACT | Status: DC | PRN
  Administered 2015-04-10: 2.5 mg via RESPIRATORY_TRACT
  Filled 2015-04-10: qty 3

## 2015-04-10 MED ORDER — POTASSIUM CHLORIDE 10 MEQ/100ML IV SOLN
10.0000 meq | INTRAVENOUS | Status: AC
  Administered 2015-04-10 (×4): 10 meq via INTRAVENOUS
  Filled 2015-04-10 (×4): qty 100

## 2015-04-10 NOTE — Progress Notes (Signed)
[facility] PROGRESS NOTE  Emily Mccullough [id number] DOB: February 11, 1946 DOA: 04/08/2015 PCP: Kathlene November, MD  HPI/Brief narrative 69 y.o. female with a history of depression, hypertension, hypothyroidism, PSP presented as a direct admission from Dr. Larose Kells. Patient presented to his office today after feeling poorly with cough for the past 3 weeks prior to admission  Assessment/Plan: Bibasilar pneumonia with sepsis on admission -Chest x-ray showed bilateral pneumonia with small pleural effusion on the left; radiology recommendations for repeat chest x-ray in 3-4 weeks -Patient was started on empiric azithromycin and ceftriaxone, later changed to vanc and zosyn given sepsis and initial concerns for aspiration -Leukocytosis improved to 25k -Pending blood cultures, sputum culture, Legionella and strep pneumonia antigens -Pt evaluated by SLP, recs for dysphagia 3 diet  Hypothyroidism -Continue Synthroid  Hypertension -Continued losartan -Lasix held until labs obtained  History of breast cancer and melanoma -Stable  Depression -Continue Pamelor  GERD -Continue Protonix  Generalized weakness -Consulted PT and OT  History of PSP -Follows with Dr. Jannifer Franklin, neurology  Code Status: Full Family Communication: Pt in room Disposition Plan: Home pending resolution of sepsis and when pt able to tolerate PO meds   Consultants:    Procedures:    Antibiotics: Anti-infectives    Start     Dose/Rate Route Frequency Ordered Stop   04/09/15 1600  vancomycin (VANCOCIN) IVPB 1000 mg/200 mL premix     1,000 mg 200 mL/hr over 60 Minutes Intravenous Every 12 hours 04/09/15 1513     04/09/15 1600  piperacillin-tazobactam (ZOSYN) IVPB 3.375 g     3.375 g 12.5 mL/hr over 240 Minutes Intravenous Every 8 hours 04/09/15 1513     04/08/15 1800  cefTRIAXone (ROCEPHIN) 1 g in dextrose 5 % 50 mL IVPB  Status:  Discontinued     1 g 100 mL/hr over 30 Minutes Intravenous Every 24 hours  04/08/15 1604 04/09/15 1452   04/08/15 1545  azithromycin (ZITHROMAX) 500 mg in dextrose 5 % 250 mL IVPB  Status:  Discontinued     500 mg 250 mL/hr over 60 Minutes Intravenous Every 24 hours 04/08/15 1535 04/09/15 1452      HPI/Subjective: Reports feeling better today  Objective: Filed Vitals:   04/09/15 1338 04/09/15 2051 04/10/15 0459 04/10/15 1353  BP: 115/90 136/72 142/65 140/60  Pulse: 99 98 94 97  Temp: 98.2 F (36.8 C) 99.1 F (37.3 C) 99.9 F (37.7 C) 99.2 F (37.3 C)  TempSrc: Oral Oral Oral Oral  Resp: 20 18 18 24   Height:      Weight:      SpO2: 96% 96% 92% 96%    Intake/Output Summary (Last 24 hours) at 04/10/15 1459 Last data filed at 04/10/15 0820  Gross per 24 hour  Intake    560 ml  Output      0 ml  Net    560 ml   Filed Weights   04/08/15 1441  Weight: 79.7 kg (175 lb 11.3 oz)    Exam:   General:  Awake, laying in bed, in nad  Cardiovascular: regular, s1, s2  Respiratory: normal resp effort, no wheezing  Abdomen: soft,nondistended  Musculoskeletal: perfused, no clubbing, no cyaonosis  Data Reviewed: Basic Metabolic Panel:  Recent Labs Lab 04/08/15 1600 04/09/15 0535 04/10/15 0545 04/10/15 1115  NA 142 138 141  --   K 3.6 3.2* 3.1*  --   CL 107 104 107  --   CO2 23 22 23   --   GLUCOSE 112* 119* 119*  --  BUN 16 13 14   --   CREATININE 0.59 0.66 0.61  --   CALCIUM 8.8* 8.4* 8.3*  --   MG  --   --   --  2.0   Liver Function Tests:  Recent Labs Lab 04/08/15 1600  AST 82*  ALT 140*  ALKPHOS 203*  BILITOT 0.9  PROT 7.2  ALBUMIN 2.6*   No results for input(s): LIPASE, AMYLASE in the last 168 hours. No results for input(s): AMMONIA in the last 168 hours. CBC:  Recent Labs Lab 04/08/15 1600 04/09/15 0535 04/10/15 0545  WBC 28.9* 26.7* 25.5*  HGB 7.7* 8.2* 7.8*  HCT 23.6* 25.2* 24.0*  MCV 81.4 80.3 78.9  PLT 621* 678* 639*   Cardiac Enzymes: No results for input(s): CKTOTAL, CKMB, CKMBINDEX, TROPONINI in the  last 168 hours. BNP (last 3 results) No results for input(s): BNP in the last 8760 hours.  ProBNP (last 3 results) No results for input(s): PROBNP in the last 8760 hours.  CBG: No results for input(s): GLUCAP in the last 168 hours.  Recent Results (from the past 240 hour(s))  Culture, blood (routine x 2) Call MD if unable to obtain prior to antibiotics being given     Status: None (Preliminary result)   Collection Time: 04/08/15  3:55 PM  Result Value Ref Range Status   Specimen Description BLOOD LEFT ANTECUBITAL  Final   Special Requests BOTTLES DRAWN AEROBIC AND ANAEROBIC 5 CC EA  Final   Culture   Final    NO GROWTH 2 DAYS Performed at [facility]    Report Status PENDING  Incomplete  Culture, blood (routine x 2) Call MD if unable to obtain prior to antibiotics being given     Status: None (Preliminary result)   Collection Time: 04/08/15  4:10 PM  Result Value Ref Range Status   Specimen Description BLOOD LEFT HAND  Final   Special Requests BOTTLES DRAWN AEROBIC AND ANAEROBIC 3 CC EA  Final   Culture   Final    NO GROWTH 2 DAYS Performed at [facility]    Report Status PENDING  Incomplete     Studies: Dg Swallowing Func-speech Pathology  04/10/2015  Objective Swallowing Evaluation:   Patient Details Name: Emily Mccullough MRN: [id number] Date of Birth: 12-11-1945 Today's Date: 04/10/2015 Time: SLP Start Time (ACUTE ONLY): 1235-SLP Stop Time (ACUTE ONLY): 1310 SLP Time Calculation (min) (ACUTE ONLY): 35 min Past Medical History: Past Medical History Diagnosis Date . PSP (progressive supranuclear palsy) ([place]) 12/09   neuro Dr.Willis, initially dx as parkinson, dx changed to PSP in 2010 . Depression  . GERD (gastroesophageal reflux disease)  . Osteoporosis  . Hyperlipidemia  . Hypothyroidism  . Hypertension  . Chronic back pain  . Hoarseness  . Colonic polyp  . Postmenopausal status  . SOB (shortness of breath)    s/p a negative Cardio-pulmonary stress test 08-27-04,  normal PFT s 2006   . Allergic rhinitis  . Obese    Moderate . Breast CA ([place]) 1996 . Melanoma ([place]) 2006   in situ nose,s/p mohs surgery . Headache(784.0)    migraines  . Severe dysarthria 12/23/2014 Past Surgical History: Past Surgical History Procedure Laterality Date . Mastectomy   . Total abdominal hysterectomy w/ bilateral salpingoophorectomy  1990 . Fracture arm   . Spinal fusion  10/24/07   6 screws . Cataract extraction     right HPI: Pt is a 69 yo female with h/o of depression, hypertension, hypothyroidism,  dysphagia PSP presented as a direct admission from Dr. Larose Kells. Patient presented to his office today after feeling poorly with cough for the past 3 weeks. Patient was reportedly weak and sleepy with a low-grade fever at home. While at her PCPs office today, chest x-ray conducted showing pneumonia. Subjective: pt awake in bed= slp assisted to transport pt to xray Assessment / Plan / Recommendation CHL IP CLINICAL IMPRESSIONS 04/10/2015 Therapy Diagnosis Moderately severe oral and moderate pharyngeal dysphagia Clinical Impression Moderately severe oral and moderate pharyngeal dysphagia consistent with symptoms of dysphagia with PSP.  Delayed oral transiting and lingual pumping with mild oral residuals noted due to lingual weakness. Pharyngeal swallow was moderately weak due to decreased tongue base retraction allowing vallecular more than pyriform sinus residuals with only intermittent sensation.  Pudding residuals precariously lodged on epiglottis without pt awareness.  Liquid consumption and head turn left helpful to decrease residuals.  Pt coughed during testing without barium visualized in larynx to vocal cords/trachea, however suspect aspiration of secretions.  Mild amount of laryngeal penetration noted with thin and nectar due to decreased epiglottic deflection and laryngeal elevation.  Cued cough did marginally decrease penetrates.  Recommend modify diet to maximize airway protection and mitigate  aspiration risk.  As pt's cough is grossly weak, this will increase her asp pna risk if she aspirates.  Using live video, educated pt to findings/concerns.  Recommend dys3/thin diet with STRICT precautions.  SlP to follow up to aid in dysphagia.  Pt does admit to recent coughing with liquids x3 weeks.   Impact on safety and function Moderate aspiration risk   CHL IP TREATMENT RECOMMENDATION 04/10/2015 Treatment Recommendations Therapy as outlined in treatment plan below   Prognosis 04/09/2015 Prognosis for Safe Diet Advancement Guarded Barriers to Reach Goals Severity of deficits;Time post onset Barriers/Prognosis Comment -- CHL IP DIET RECOMMENDATION 04/10/2015 SLP Diet Recommendations Dysphagia 3 (Mech soft) solids;Thin liquid Liquid Administration via Cup, straw Medication Administration Crushed with puree Compensations -head turn left, dry swallows, cough Postural Changes -sit upright fully    CHL IP OTHER RECOMMENDATIONS 04/10/2015 Recommended Consults -- Oral Care Recommendations Oral care BID Other Recommendations Clarify dietary restrictions;Have oral suction available   CHL IP FOLLOW UP RECOMMENDATIONS 04/10/2015 Follow up Recommendations Home health SLP   CHL IP FREQUENCY AND DURATION 04/10/2015 Speech Therapy Frequency (ACUTE ONLY) min 2x/week Treatment Duration 2 weeks      CHL IP ORAL PHASE 04/09/2015 Oral Phase Impaired Oral - Pudding Teaspoon -- Oral - Pudding Cup -- Oral - Honey Teaspoon -- Oral - Honey Cup -- Oral - Nectar Teaspoon Weak lingual manipulation;Lingual pumping;Delayed oral transit;Lingual/palatal residue Oral - Nectar Cup Delayed oral transit;Lingual/palatal residue;Lingual pumping;Weak lingual manipulation Oral - Nectar Straw Weak lingual manipulation;Lingual pumping;Lingual/palatal residue;Delayed oral transit Oral - Thin Teaspoon Delayed oral transit;Lingual/palatal residue;Lingual pumping;Weak lingual manipulation Oral - Thin Cup Weak lingual manipulation;Lingual pumping;Delayed  oral transit Oral - Thin Straw Delayed oral transit;Lingual/palatal residue;Lingual pumping;Weak lingual manipulation Oral - Puree Weak lingual manipulation;Delayed oral transit;Lingual pumping Oral - Mech Soft -- Oral - Regular Impaired mastication;Weak lingual manipulation;Lingual pumping Oral - Multi-Consistency -- Oral - Pill Weak lingual manipulation;Lingual pumping Oral Phase - Comment --  CHL IP PHARYNGEAL PHASE 04/09/2015 Pharyngeal Phase Impaired Pharyngeal- Pudding Teaspoon -- Pharyngeal -- Pharyngeal- Pudding Cup -- Pharyngeal -- Pharyngeal- Honey Teaspoon -- Pharyngeal -- Pharyngeal- Honey Cup -- Pharyngeal -- Pharyngeal- Nectar Teaspoon Reduced epiglottic inversion;Reduced pharyngeal peristalsis;Reduced anterior laryngeal mobility;Reduced laryngeal elevation;Reduced airway/laryngeal closure;Reduced tongue base retraction;Penetration/Aspiration during swallow;Pharyngeal residue - valleculae;Pharyngeal residue -  pyriform Pharyngeal Material enters airway, remains ABOVE vocal cords and not ejected out Pharyngeal- Nectar Cup Reduced pharyngeal peristalsis;Reduced epiglottic inversion;Reduced anterior laryngeal mobility;Reduced laryngeal elevation;Reduced airway/laryngeal closure;Reduced tongue base retraction;Pharyngeal residue - valleculae;Pharyngeal residue - pyriform Pharyngeal -- Pharyngeal- Nectar Straw Reduced airway/laryngeal closure;Reduced laryngeal elevation;Reduced anterior laryngeal mobility;Reduced epiglottic inversion;Reduced pharyngeal peristalsis;Pharyngeal residue - valleculae;Pharyngeal residue - pyriform Pharyngeal -- Pharyngeal- Thin Teaspoon Reduced tongue base retraction;Reduced airway/laryngeal closure;Reduced laryngeal elevation;Reduced anterior laryngeal mobility;Reduced epiglottic inversion;Reduced pharyngeal peristalsis;Penetration/Aspiration during swallow;Pharyngeal residue - valleculae;Pharyngeal residue - pyriform Pharyngeal Material enters airway, remains ABOVE vocal cords  and not ejected out Pharyngeal- Thin Cup Reduced pharyngeal peristalsis;Reduced epiglottic inversion;Reduced anterior laryngeal mobility;Reduced laryngeal elevation;Reduced airway/laryngeal closure;Reduced tongue base retraction;Pharyngeal residue - valleculae;Pharyngeal residue - pyriform Pharyngeal -- Pharyngeal- Thin Straw Pharyngeal residue - valleculae;Pharyngeal residue - pyriform;Reduced laryngeal elevation;Reduced anterior laryngeal mobility;Reduced epiglottic inversion;Reduced pharyngeal peristalsis Pharyngeal -- Pharyngeal- Puree Pharyngeal residue - valleculae;Reduced laryngeal elevation;Reduced anterior laryngeal mobility;Reduced epiglottic inversion;Reduced pharyngeal peristalsis;Reduced airway/laryngeal closure;Reduced tongue base retraction;Pharyngeal residue - posterior pharnyx Pharyngeal -- Pharyngeal- Mechanical Soft -- Pharyngeal -- Pharyngeal- Regular Pharyngeal residue - valleculae;Reduced tongue base retraction;Reduced airway/laryngeal closure;Reduced laryngeal elevation;Reduced anterior laryngeal mobility;Reduced epiglottic inversion;Reduced pharyngeal peristalsis Pharyngeal -- Pharyngeal- Multi-consistency -- Pharyngeal -- Pharyngeal- Pill Pharyngeal residue - valleculae;Reduced tongue base retraction;Reduced airway/laryngeal closure;Reduced laryngeal elevation;Reduced anterior laryngeal mobility;Reduced epiglottic inversion;Reduced pharyngeal peristalsis Pharyngeal -- Pharyngeal Comment head turn left (to pt's weak side per her statement) helpful to decrease residuals  CHL IP CERVICAL ESOPHAGEAL PHASE 04/09/2015 Cervical Esophageal Phase WFL, upon esoph sweep x2, esophagus appeared fully clear!  Pudding Teaspoon -- Pudding Cup -- Honey Teaspoon -- Honey Cup -- Nectar Teaspoon -- Nectar Cup -- Nectar Straw -- Thin Teaspoon -- Thin Cup -- Thin Straw -- Puree -- Mechanical Soft -- Regular -- Multi-consistency -- Pill -- Cervical Esophageal Comment -- Luanna Salk, MS [facility] SLP [phone]                Scheduled Meds: . levothyroxine  75 mcg Oral QAC breakfast  . losartan  100 mg Oral Daily  . multivitamin with minerals  1 tablet Oral Daily  . nortriptyline  25 mg Oral QHS  . pantoprazole  40 mg Oral BID  . piperacillin-tazobactam (ZOSYN)  IV  3.375 g Intravenous Q8H  . sodium chloride  3 mL Intravenous Q12H  . vancomycin  1,000 mg Intravenous Q12H   Continuous Infusions:   Principal Problem:   Pneumonia Active Problems:   Hypothyroidism   Depression   PSP (progressive supranuclear palsy) ([place])   Essential hypertension   GERD   BREAST CANCER, HX OF   CHIU, STEPHEN K  [facility] Pager [phone]. If 7PM-7AM, please contact night-coverage at [email or web address], password [facility] 04/10/2015, 2:59 PM  LOS: 2 days

## 2015-04-10 NOTE — Progress Notes (Signed)
MBS Report in imaging section  HPI: Pt is a 69 yo female with h/o of depression, hypertension, hypothyroidism, dysphagia PSP presented as a direct admission from Dr. Larose Kells. Patient presented to his office today after feeling poorly with cough for the past 3 weeks. Patient was reportedly weak and sleepy with a low-grade fever at home. While at her PCPs office today, chest x-ray conducted showing pneumonia.  Subjective: pt awake in bed= slp assisted to transport pt to xray  Assessment / Plan / Recommendation  CHL IP CLINICAL IMPRESSIONS 04/10/2015  Therapy Diagnosis Moderately severe oral and moderate pharyngeal dysphagia  Clinical Impression Moderately severe oral and moderate pharyngeal dysphagia consistent with symptoms of dysphagia with PSP.  Delayed oral transiting and lingual pumping with mild oral residuals noted due to lingual weakness. Pharyngeal swallow was moderately weak due to decreased tongue base retraction allowing vallecular more than pyriform sinus residuals with only intermittent sensation.  Pudding residuals precariously lodged on epiglottis without pt awareness.  Liquid consumption and head turn left helpful to decrease residuals.  Pt coughed during testing without barium visualized in larynx to vocal cords/trachea, however suspect aspiration of secretions.  Mild amount of laryngeal penetration noted with thin and nectar due to decreased epiglottic deflection and laryngeal elevation.  Cued cough did marginally decrease penetrates.  Recommend modify diet to maximize airway protection and mitigate aspiration risk.  As pt's cough is grossly weak, this will increase her asp pna risk if she aspirates.  Using live video, educated pt to findings/concerns.  Recommend dys3/thin diet with STRICT precautions.  SlP to follow up to aid in dysphagia.  Pt does admit to recent coughing with liquids x3 weeks.    Impact on safety and function Moderate aspiration risk      CHL IP TREATMENT RECOMMENDATION  04/10/2015  Treatment Recommendations Therapy as outlined in treatment plan below     Prognosis 04/09/2015  Prognosis for Safe Diet Advancement Guarded  Barriers to Reach Goals Severity of deficits;Time post onset  Barriers/Prognosis Comment --    CHL IP DIET RECOMMENDATION 04/10/2015  SLP Diet Recommendations Dysphagia 3 (Mech soft) solids;Thin liquid  Liquid Administration via Cup, straw  Medication Administration Crushed with puree  Compensations -head turn left, dry swallows, cough  Postural Changes -sit upright fully       CHL IP OTHER RECOMMENDATIONS 04/10/2015  Recommended Consults --  Oral Care Recommendations Oral care BID  Other Recommendations Clarify dietary restrictions;Have oral suction available      Luanna Salk, [facility] SLP [phone]

## 2015-04-11 ENCOUNTER — Inpatient Hospital Stay (HOSPITAL_COMMUNITY): Payer: Medicare Other

## 2015-04-11 LAB — BASIC METABOLIC PANEL
ANION GAP: 11 (ref 5–15)
BUN: 14 mg/dL (ref 6–20)
CALCIUM: 8.4 mg/dL — AB (ref 8.9–10.3)
CHLORIDE: 108 mmol/L (ref 101–111)
CO2: 22 mmol/L (ref 22–32)
CREATININE: 0.65 mg/dL (ref 0.44–1.00)
Glucose, Bld: 124 mg/dL — ABNORMAL HIGH (ref 65–99)
POTASSIUM: 3.1 mmol/L — AB (ref 3.5–5.1)
SODIUM: 141 mmol/L (ref 135–145)

## 2015-04-11 LAB — CBC
HCT: 25.7 % — ABNORMAL LOW (ref 36.0–46.0)
Hemoglobin: 8.3 g/dL — ABNORMAL LOW (ref 12.0–15.0)
MCH: 25.9 pg — AB (ref 26.0–34.0)
MCHC: 32.3 g/dL (ref 30.0–36.0)
MCV: 80.1 fL (ref 78.0–100.0)
PLATELETS: 655 10*3/uL — AB (ref 150–400)
RBC: 3.21 MIL/uL — AB (ref 3.87–5.11)
RDW: 15.6 % — ABNORMAL HIGH (ref 11.5–15.5)
WBC: 23.9 10*3/uL — ABNORMAL HIGH (ref 4.0–10.5)

## 2015-04-11 LAB — STREP PNEUMONIAE URINARY ANTIGEN: Strep Pneumo Urinary Antigen: NEGATIVE

## 2015-04-11 LAB — BLOOD GAS, ARTERIAL
Acid-base deficit: 0.5 mmol/L (ref 0.0–2.0)
BICARBONATE: 21.8 meq/L (ref 20.0–24.0)
DRAWN BY: 257701
O2 Content: 2 L/min
O2 SAT: 93.5 %
PH ART: 7.489 — AB (ref 7.350–7.450)
Patient temperature: 98.6
TCO2: 19.8 mmol/L (ref 0–100)
pCO2 arterial: 28.9 mmHg — ABNORMAL LOW (ref 35.0–45.0)
pO2, Arterial: 71.6 mmHg — ABNORMAL LOW (ref 80.0–100.0)

## 2015-04-11 LAB — VANCOMYCIN, TROUGH: VANCOMYCIN TR: 12 ug/mL (ref 10.0–20.0)

## 2015-04-11 MED ORDER — VANCOMYCIN HCL 10 G IV SOLR
1250.0000 mg | Freq: Two times a day (BID) | INTRAVENOUS | Status: DC
  Administered 2015-04-12: 1250 mg via INTRAVENOUS
  Filled 2015-04-11 (×2): qty 1250

## 2015-04-11 MED ORDER — POTASSIUM CHLORIDE 20 MEQ PO PACK: 40.0000 meq | PACK | Freq: Two times a day (BID) | ORAL | Status: DC

## 2015-04-11 MED ORDER — IPRATROPIUM-ALBUTEROL 0.5-2.5 (3) MG/3ML IN SOLN
3.0000 mL | Freq: Four times a day (QID) | RESPIRATORY_TRACT | Status: DC
  Administered 2015-04-11 (×2): 3 mL via RESPIRATORY_TRACT
  Filled 2015-04-11 (×2): qty 3

## 2015-04-11 MED ORDER — IPRATROPIUM-ALBUTEROL 0.5-2.5 (3) MG/3ML IN SOLN
3.0000 mL | Freq: Three times a day (TID) | RESPIRATORY_TRACT | Status: DC
  Administered 2015-04-12 – 2015-04-13 (×5): 3 mL via RESPIRATORY_TRACT
  Filled 2015-04-11 (×5): qty 3

## 2015-04-11 MED ORDER — IPRATROPIUM-ALBUTEROL 0.5-2.5 (3) MG/3ML IN SOLN: 3.0000 mL | RESPIRATORY_TRACT | Status: DC | PRN

## 2015-04-11 MED ORDER — POTASSIUM CHLORIDE CRYS ER 20 MEQ PO TBCR
40.0000 meq | EXTENDED_RELEASE_TABLET | Freq: Two times a day (BID) | ORAL | Status: AC
  Administered 2015-04-11 (×2): 40 meq via ORAL
  Filled 2015-04-11 (×2): qty 2

## 2015-04-11 NOTE — Care Management Note (Signed)
Case Management Note  Patient Details  Name: Emily Mccullough MRN: [id number] Date of Birth: 08-16-1945  Subjective/Objective:   Current d/c plan SNF.CSW following.                 Action/Plan:   Expected Discharge Date:   (UNKNOWN)               Expected Discharge Plan:  Skilled Nursing Facility  In-House Referral:     Discharge planning Services  CM Consult  Post Acute Care Choice:    Choice offered to:  Spouse  DME Arranged:    DME Agency:     HH Arranged:    [place] Agency:     Status of Service:  In process, will continue to follow  Medicare Important Message Given:    Date Medicare IM Given:    Medicare IM give by:    Date Additional Medicare IM Given:    Additional Medicare Important Message give by:     If discussed at [place] of Stay Meetings, dates discussed:    Additional Comments:  Dessa Phi, RN 04/11/2015, 12:29 PM

## 2015-04-11 NOTE — Progress Notes (Signed)
Patient with labored breathing. Tachypnea, respirations 28 per minute. Patient on RA. RN auscultated expiratory wheezes throughout lungs and rhonchi in upper lobes. MD made aware of findings. Orders placed for CXR, nebulizer treatments and to check an ABG. Respiratory made aware of new orders. Will continue to monitor patient.   Othella Boyer [facility] 04/11/2015

## 2015-04-11 NOTE — Progress Notes (Addendum)
Speech Language Pathology Treatment: Dysphagia  Patient Details Name: Emily Mccullough MRN: [id number] DOB: 1945/05/29 Today's Date: 04/11/2015 Time: 1213-1230 SLP Time Calculation (min) (ACUTE ONLY): 17 min  Assessment / Plan / Recommendation Clinical Impression  Pt seen eating lunch today - fish, mashed potatoes, carrots and water via straw.  She was able to self feed once set up completed by RN.   She benefited from minimal cues to alternate solids/liquids  - not consume at the same time.  SLP explained clinical reasoning for this strategy.  She is conducting her head turn left without cues and reports strategies have been helpful to decrease cough with liquids!  Pt with cough x1 after swallow of water - occurred as she was reaching for lid for cup - ? If caused Discoordination with swallow. Of note, cough was not coorelated to aspiration during mbs yesterday.  Pt became mildly dyspneic with intake - and SLP advised her to remove pudding/peaches from tray to eat later after respiratory status has recovered.   Note per CXR pt with progression of left lobe infiltrate - suspected pna.    Her aspiration risk remains moderate due to her level of dysphagia from her PSP - and weakness/weak cough increases her aspiration pna risk.      Given pt's progressive neuro diagnosis, addressing long term goals re: nutrition, aspiration risk may be beneficial.    SLP recommends to continue strict precautions and follow up SLP at home to help mitigate dysphagia/aspiration risk.       HPI HPI: Pt is a 69 yo female with h/o of depression, hypertension, hypothyroidism, dysphagia PSP presented as a direct admission from Dr. Larose Mccullough. Patient presented to his office today after feeling poorly with cough for the past 3 weeks. Patient was reportedly weak and sleepy with a low-grade fever at home. While at her PCPs office today, chest x-ray conducted showing pneumonia.      SLP Plan  Continue with current plan of care      Recommendations  Diet recommendations: Dysphagia 3 (mechanical soft);Thin liquid Medication Administration: Whole meds with puree Supervision: Intermittent supervision to cue for compensatory strategies (set up assist) Compensations: Slow rate;Small sips/bites (start meals with liquids, intermittent cough) Postural Changes and/or Swallow Maneuvers: Seated upright 90 degrees;Upright 30-60 min after meal              Oral Care Recommendations: Oral care BID Follow up Recommendations: Home health SLP Plan: Continue with current plan of care   Emily Mccullough, Emily Mccullough SLP [phone]

## 2015-04-11 NOTE — Clinical Social Work Note (Signed)
Clinical Social Work Assessment  Patient Details  Name: Emily Mccullough MRN: [id number] Date of Birth: 11-23-1945  Date of referral:  04/11/15               Reason for consult:  Facility Placement                Permission sought to share information with:  Facility Art therapist granted to share information::  Yes, Verbal Permission Granted  Name::      (husband- patient was lethargic)  Agency::   (SNF [facility])  Relationship::     Contact Information:     Housing/Transportation Living arrangements for the past 2 months:  Single Family Home Source of Information:    Patient Interpreter Needed:  None Criminal Activity/Legal Involvement Pertinent to Current Situation/Hospitalization:  No - Comment as needed Significant Relationships:    Lives with:  Spouse Do you feel safe going back to the place where you live?  No Need for family participation in patient care:  Yes (Comment) (Today, patient is lethargic and could not complete assesment )  Care giving concerns:  Patient's husband was frustrated that CSW contacted him in the middle of the football game and requested CSW contact him in an hour when the game is projected to be over.  CSW stressed urgency to create safe discharge plan for wife and continued assessment.  Patient's husband agreeable to continue.   Social Worker assessment / plan:  Patient was lethargic during the course of this assessment and was unable to stay awake to complete.  CSW contacted patient's husband, Emily Mccullough.  Emily Mccullough expressed frustration regarding the timing of this call (please see care giving concerns listed above) Patient is  non-ambulatory at baseline but husband states patient has had a decline the past month "or so".  Husband is agreeable to SNF if he feels she cannot return home.  Husband is unsure if patient is at her baseline.  Patient's husband was not fully engaged in this assessment, but was agreeable to SNF  search.  Employment status:    Insurance information:  Medicare PT Recommendations:  [place] / Referral to community resources:  [place]  Patient/Family's Response to care:  Husband agreeable to SNF search  Patient/Family's Understanding of and Emotional Response to Diagnosis, Current Treatment, and Prognosis:  Unable to accurately assess  Emotional Assessment Appearance:  Appears stated age Attitude/Demeanor/Rapport:   (appropriate) Affect (typically observed):    Orientation:  Oriented to Self, Oriented to Place, Oriented to  Time, Oriented to Situation Alcohol / Substance use:  Not Applicable Psych involvement (Current and /or in the community):  No (Comment)  Discharge Needs  Concerns to be addressed:  No discharge needs identified Readmission within the last 30 days:  No Current discharge risk:  None Barriers to Discharge:  No Barriers Identified   Dulcy Fanny, LCSW 04/11/2015, 2:45 PM

## 2015-04-11 NOTE — Clinical Social Work Placement (Signed)
   CLINICAL SOCIAL WORK PLACEMENT  NOTE  Date:  04/11/2015  Patient Details  Name: Emily Mccullough MRN: [id number] Date of Birth: 1945/10/09  Clinical Social Work is seeking post-discharge placement for this patient at the [place] level of care (*CSW will initial, date and re-position this form in  chart as items are completed):  Yes   Patient/family provided with [facility]'s list of facilities offering this level of care within the geographic area requested by the patient (or if unable, by the patient's family).  Yes   Patient/family informed of their freedom to choose among providers that offer the needed level of care, that participate in Medicare, Medicaid or managed care program needed by the patient, have an available bed and are willing to accept the patient.  Yes   Patient/family informed of [facility]'s ownership interest in [facility] and [facility], as well as of the fact that they are under no obligation to receive care at these facilities.  PASRR submitted to EDS on 04/11/15     PASRR number received on 04/11/15     Existing PASRR number confirmed on       FL2 transmitted to all facilities in geographic area requested by pt/family on 04/11/15     FL2 transmitted to all facilities within larger geographic area on       Patient informed that his/her managed care company has contracts with or will negotiate with certain facilities, including the following:            Patient/family informed of bed offers received.  Patient chooses bed at       Physician recommends and patient chooses bed at      Patient to be transferred to   on  .  Patient to be transferred to facility by       Patient family notified on   of transfer.  Name of family member notified:        PHYSICIAN Please sign FL2     Additional Comment:    _______________________________________________ Dulcy Fanny, LCSW 04/11/2015,  2:49 PM

## 2015-04-11 NOTE — Progress Notes (Signed)
Pharmacy Antibiotic Time-Out Note  Emily Mccullough is a 69 y.o. year-old female admitted on 04/08/2015.  The patient is currently on Vancomycin and Zosyn for aspiration pneumonia and sepsis.  Assessment/Plan: This patient's current antibiotics will be continued without adjustments.  - Vancomycin 1g IV q12h - Zosyn 3.375gm IV q8h (4hr extended infusions) Check vancomycin trough tonight at 16:30 before 5th dose Follow up renal function & cultures, plan for de-escalation  Temp (24hrs), Avg:98.5 F (36.9 C), Min:98 F (36.7 C), Max:99.2 F (37.3 C)   Recent Labs Lab 04/08/15 1600 04/09/15 0535 04/10/15 0545 04/11/15 0515  WBC 28.9* 26.7* 25.5* 23.9*    Recent Labs Lab 04/08/15 1600 04/09/15 0535 04/10/15 0545 04/11/15 0515  CREATININE 0.59 0.66 0.61 0.65   Estimated Creatinine Clearance: 69.7 mL/min (by C-G formula based on Cr of 0.65).    Antimicrobial allergies: neosporin (rash)  Antimicrobials this admission: 11/22 >> Azithromycin 500 mg IV q24h >> 11/23 11/22 >> Ceftriaxone 1g IV q24h >> 11/23 11/23>>Vancomycin >> 11/23>>Zosyn >>  Levels/dose changes this admission: 11/25 Vancomycin trough at 16: 30 = ____ before 5th dose  Microbiology Results: 11/22 blood x2: NGTD 11/25 urine strep/legionella: pending Sputum - ordered  Thank you for allowing pharmacy to be a part of this patient's care.  Peggyann Juba, PharmD, BCPS Pager: [phone]  04/11/2015 9:36 AM

## 2015-04-11 NOTE — Progress Notes (Signed)
Pharmacy - Brief Note - vancomycin level   Pharmacy Consult for Vancomycin & Zosyn Indication: pneumonia  Please refer to pharmacist note from earlier today for full details.  The patient is currently on Vancomycin and Zosyn for aspiration pneumonia and sepsis.  Levels/dose changes this admission: 11/25 Vancomycin trough at 16: 30 = 75mcg/ml on 1gm q12h (prior to 5th dose)  Vancomycin trough goal 15-20 mcg/ml  Plan  Change vancomycin to 1250mg  IV q12h for new trough = 17mcg/ml  Doreene Eland, PharmD, BCPS.   Pager: [phone] 04/11/2015 4:14 PM

## 2015-04-11 NOTE — NC FL2 (Signed)
[place] LEVEL OF CARE SCREENING TOOL     IDENTIFICATION  Patient Name: Emily Mccullough Birthdate: [birth year] Sex: female Admission Date (Current Location): 04/08/2015  [facility] and [place] Number: Herbalist and Address:  [facility], [place] [address] [place], [place] [address]      Provider Number: [id number]  Attending Physician Name and Address:  Donne Hazel, MD  Relative Name and Phone Number:       Current Level of Care: Hospital Recommended Level of Care: [place] Prior Approval Number:    Date Approved/Denied:   PASRR Number: [id number]  Discharge Plan: SNF    Current Diagnoses: Patient Active Problem List   Diagnosis Date Noted  . Pneumonia 04/08/2015  . PCP NOTES >>>>>> 04/08/2015  . Severe dysarthria 12/23/2014  . Annual physical exam 03/06/2013  . Anemia 03/06/2013  . HIP PAIN, LEFT 05/28/2009  . PSP (progressive supranuclear palsy) ([place]) 07/05/2008  . ALLERGIC RHINITIS 02/29/2008  . BREAST CANCER, HX OF 10/13/2007  . Essential hypertension August 18, 202008  . BACK PAIN, CHRONIC August 18, 202008  . Hypothyroidism 12/27/2006  . Hyperlipidemia 10/03/2006  . Depression 10/03/2006  . GERD 10/03/2006  . POSTMENOPAUSAL STATUS 10/03/2006  . Osteoporosis 10/03/2006  . COLONIC POLYPS, HX OF 10/03/2006  . COLONOSCOPY, HX OF 10/03/2006    Orientation ACTIVITIES/SOCIAL BLADDER RESPIRATION    Self, Time, Situation, Place    Continent  (2L)  BEHAVIORAL SYMPTOMS/MOOD NEUROLOGICAL BOWEL NUTRITION STATUS      Continent    PHYSICIAN VISITS COMMUNICATION OF NEEDS Height & Weight Skin    Verbally 5\' 6"  (167.6 cm) 171 lbs. Normal          AMBULATORY STATUS RESPIRATION    Assist extensive  (2L)      Personal Care Assistance Level of Assistance  Bathing, Dressing Bathing Assistance: Maximum assistance   Dressing Assistance: Maximum assistance      Functional Limitations Info                 SPECIAL CARE FACTORS FREQUENCY  OT (By licensed OT), PT (By licensed PT)     PT Frequency: daily OT Frequency: daily           Additional Factors Info  Code Status, Allergies Code Status Info: FULL Allergies Info: Zostavax, Neosporin           Current Medications (04/11/2015): Current Facility-Administered Medications  Medication Dose Route Frequency Provider Last Rate Last Dose  . 0.9 %  sodium chloride infusion  250 mL Intravenous PRN Maryann Mikhail, DO      . acetaminophen (TYLENOL) tablet 650 mg  650 mg Oral Q6H PRN Maryann Mikhail, DO       Or  . acetaminophen (TYLENOL) suppository 650 mg  650 mg Rectal Q6H PRN Maryann Mikhail, DO      . ipratropium-albuterol (DUONEB) 0.5-2.5 (3) MG/3ML nebulizer solution 3 mL  3 mL Nebulization Q6H Donne Hazel, MD      . ipratropium-albuterol (DUONEB) 0.5-2.5 (3) MG/3ML nebulizer solution 3 mL  3 mL Nebulization Q2H PRN Donne Hazel, MD      . levothyroxine (SYNTHROID, LEVOTHROID) tablet 75 mcg  75 mcg Oral QAC breakfast Maryann Mikhail, DO   75 mcg at 04/11/15 1015  . losartan (COZAAR) tablet 100 mg  100 mg Oral Daily Maryann Mikhail, DO   100 mg at 04/11/15 1015  . multivitamin with minerals tablet 1 tablet  1 tablet Oral Daily Maryann Mikhail, DO  1 tablet at 04/11/15 1015  . nortriptyline (PAMELOR) capsule 25 mg  25 mg Oral QHS Maryann Mikhail, DO   25 mg at 04/10/15 2215  . ondansetron (ZOFRAN) tablet 4 mg  4 mg Oral Q6H PRN Maryann Mikhail, DO       Or  . ondansetron (ZOFRAN) injection 4 mg  4 mg Intravenous Q6H PRN Maryann Mikhail, DO      . oxyCODONE (Oxy IR/ROXICODONE) immediate release tablet 15 mg  15 mg Oral Q6H PRN Maryann Mikhail, DO      . pantoprazole (PROTONIX) EC tablet 40 mg  40 mg Oral BID Maryann Mikhail, DO   40 mg at 04/11/15 1014  . piperacillin-tazobactam (ZOSYN) IVPB 3.375 g  3.375 g Intravenous Q8H Baltazar Najjar [place], RPH   3.375 g at 04/11/15 1014  . potassium chloride SA (K-DUR,KLOR-CON) CR tablet  40 mEq  40 mEq Oral BID Donne Hazel, MD   40 mEq at 04/11/15 1015  . sodium chloride 0.9 % injection 3 mL  3 mL Intravenous Q12H Maryann Mikhail, DO   3 mL at 04/10/15 2214  . sodium chloride 0.9 % injection 3 mL  3 mL Intravenous PRN Maryann Mikhail, DO      . vancomycin (VANCOCIN) IVPB 1000 mg/200 mL premix  1,000 mg Intravenous Q12H Thomes Lolling, RPH   1,000 mg at 04/11/15 [id number]   Do not use this list as official medication orders. Please verify with discharge summary.  Discharge Medications:   Medication List    ASK your doctor about these medications        acetaminophen 500 MG tablet  Commonly known as:  TYLENOL  Take 500 mg by mouth every 6 (six) hours as needed for moderate pain.     AMBULATORY NON FORMULARY MEDICATION  Incentive Spirometer Dx: G23.1 (PSP)     CALCIUM-VITAMIN D PO  Take 1 tablet by mouth daily.     furosemide 20 MG tablet  Commonly known as:  LASIX  Take 2 tablets (40 mg total) by mouth daily.     ibuprofen 200 MG tablet  Commonly known as:  ADVIL,MOTRIN  Take 600 mg by mouth every 6 (six) hours as needed for moderate pain.     levothyroxine 75 MCG tablet  Commonly known as:  SYNTHROID, LEVOTHROID  Take 1 tablet (75 mcg total) by mouth daily before breakfast.     losartan 100 MG tablet  Commonly known as:  COZAAR  Take 1 tablet (100 mg total) by mouth daily.     MAGNESIUM PO  Take 1 tablet by mouth every evening.     multivitamin capsule  Take 1 capsule by mouth daily.     nortriptyline 25 MG capsule  Commonly known as:  PAMELOR  Take 1 capsule (25 mg total) by mouth at bedtime.     oxyCODONE 15 MG immediate release tablet  Commonly known as:  ROXICODONE  Take 1 tablet (15 mg total) by mouth every 6 (six) hours as needed for pain (Must last 28 days).     pantoprazole 40 MG tablet  Commonly known as:  PROTONIX  Take 1 tablet (40 mg total) by mouth 2 (two) times daily.     potassium chloride SA 20 MEQ tablet  Commonly known as:   K-DUR,KLOR-CON  Take 2 tablets (40 mEq total) by mouth 2 (two) times daily.        Relevant Imaging Results:  Relevant Lab Results:  Recent Labs SSN [id number]  Additional Information  Dulcy Fanny, LCSW

## 2015-04-11 NOTE — Progress Notes (Signed)
[facility] PROGRESS NOTE  Emily Mccullough [id number] DOB: 1945/12/30 DOA: 04/08/2015 PCP: Kathlene November, MD  HPI/Brief narrative 69 y.o. female with a history of depression, hypertension, hypothyroidism, PSP presented as a direct admission from Dr. Larose Kells. Patient presented to his office today after feeling poorly with cough for the past 3 weeks prior to admission  Assessment/Plan: Bibasilar pneumonia with sepsis on admission -Chest x-ray showed bilateral pneumonia, primarily on L, with small pleural effusion on the left; radiology recommendations for repeat chest x-ray in 3-4 weeks -Patient was started on empiric azithromycin and ceftriaxone, later changed to vanc and zosyn given sepsis and initial concerns for aspiration -Leukocytosis slowly improving to 23k -Blood cultures neg, strep pneumonia antigen neg -Legionella pending -Pt evaluated by SLP, recs for dysphagia 3 diet - Repeat cxr with findings of progression of L pneumonia - Clinically, pt seems improved and more alert  Hypothyroidism -Continue Synthroid  Hypertension -Continued losartan -Lasix held until labs obtained  History of breast cancer and melanoma -Stable  Depression -Continue Pamelor  GERD -Continue Protonix  Generalized weakness -Consulted PT and OT  History of PSP -Follows with Dr. Jannifer Franklin, neurology  Code Status: Full Family Communication: Pt in room Disposition Plan: Home pending resolution of sepsis and when pt able to tolerate PO meds   Consultants:    Procedures:    Antibiotics: Anti-infectives    Start     Dose/Rate Route Frequency Ordered Stop   04/09/15 1600  vancomycin (VANCOCIN) IVPB 1000 mg/200 mL premix     1,000 mg 200 mL/hr over 60 Minutes Intravenous Every 12 hours 04/09/15 1513     04/09/15 1600  piperacillin-tazobactam (ZOSYN) IVPB 3.375 g     3.375 g 12.5 mL/hr over 240 Minutes Intravenous Every 8 hours 04/09/15 1513     04/08/15 1800  cefTRIAXone (ROCEPHIN) 1 g  in dextrose 5 % 50 mL IVPB  Status:  Discontinued     1 g 100 mL/hr over 30 Minutes Intravenous Every 24 hours 04/08/15 1604 04/09/15 1452   04/08/15 1545  azithromycin (ZITHROMAX) 500 mg in dextrose 5 % 250 mL IVPB  Status:  Discontinued     500 mg 250 mL/hr over 60 Minutes Intravenous Every 24 hours 04/08/15 1535 04/09/15 1452      HPI/Subjective: Not verbal at present, but nods head to indicate she feels better  Objective: Filed Vitals:   04/11/15 0454 04/11/15 1030 04/11/15 1150 04/11/15 1153  BP: 131/61     Pulse: 81     Temp: 98 F (36.7 C)     TempSrc: Axillary     Resp: 18 28    Height:      Weight:      SpO2: 98%  86% 99%    Intake/Output Summary (Last 24 hours) at 04/11/15 1305 Last data filed at 04/11/15 0925  Gross per 24 hour  Intake    430 ml  Output    250 ml  Net    180 ml   Filed Weights   04/08/15 1441  Weight: 79.7 kg (175 lb 11.3 oz)    Exam:   General:  Awake, in bed, in nad, nonverbal  Cardiovascular: regular, s1, s2  Respiratory: normal resp effort, no wheezing  Abdomen: soft,nondistended  Musculoskeletal: perfused, no cyaonosis  Data Reviewed: Basic Metabolic Panel:  Recent Labs Lab 04/08/15 1600 04/09/15 0535 04/10/15 0545 04/10/15 1115 04/11/15 0515  NA 142 138 141  --  141  K 3.6 3.2* 3.1*  --  3.1*  CL 107 104 107  --  108  CO2 23 22 23   --  22  GLUCOSE 112* 119* 119*  --  124*  BUN 16 13 14   --  14  CREATININE 0.59 0.66 0.61  --  0.65  CALCIUM 8.8* 8.4* 8.3*  --  8.4*  MG  --   --   --  2.0  --    Liver Function Tests:  Recent Labs Lab 04/08/15 1600  AST 82*  ALT 140*  ALKPHOS 203*  BILITOT 0.9  PROT 7.2  ALBUMIN 2.6*   No results for input(s): LIPASE, AMYLASE in the last 168 hours. No results for input(s): AMMONIA in the last 168 hours. CBC:  Recent Labs Lab 04/08/15 1600 04/09/15 0535 04/10/15 0545 04/11/15 0515  WBC 28.9* 26.7* 25.5* 23.9*  HGB 7.7* 8.2* 7.8* 8.3*  HCT 23.6* 25.2* 24.0*  25.7*  MCV 81.4 80.3 78.9 80.1  PLT 621* 678* 639* 655*   Cardiac Enzymes: No results for input(s): CKTOTAL, CKMB, CKMBINDEX, TROPONINI in the last 168 hours. BNP (last 3 results) No results for input(s): BNP in the last 8760 hours.  ProBNP (last 3 results) No results for input(s): PROBNP in the last 8760 hours.  CBG: No results for input(s): GLUCAP in the last 168 hours.  Recent Results (from the past 240 hour(s))  Culture, blood (routine x 2) Call MD if unable to obtain prior to antibiotics being given     Status: None (Preliminary result)   Collection Time: 04/08/15  3:55 PM  Result Value Ref Range Status   Specimen Description BLOOD LEFT ANTECUBITAL  Final   Special Requests BOTTLES DRAWN AEROBIC AND ANAEROBIC 5 CC EA  Final   Culture   Final    NO GROWTH 2 DAYS Performed at [facility]    Report Status PENDING  Incomplete  Culture, blood (routine x 2) Call MD if unable to obtain prior to antibiotics being given     Status: None (Preliminary result)   Collection Time: 04/08/15  4:10 PM  Result Value Ref Range Status   Specimen Description BLOOD LEFT HAND  Final   Special Requests BOTTLES DRAWN AEROBIC AND ANAEROBIC 3 CC EA  Final   Culture   Final    NO GROWTH 2 DAYS Performed at [facility]    Report Status PENDING  Incomplete     Studies: Dg Chest Port 1 View  04/11/2015  CLINICAL DATA:  Short of breath. EXAM: PORTABLE CHEST 1 VIEW COMPARISON:  04/08/2015 FINDINGS: Progression of left lower lobe infiltrate and left effusion, most likely due to pneumonia. Small area of atelectasis or infiltrate in the right lung base is unchanged. No effusion on the right. Negative for heart failure. IMPRESSION: Progression of left lower lobe infiltrate and left effusion. Probable pneumonia. Electronically Signed   By: Franchot Gallo M.D.   On: 04/11/2015 11:40   Dg Swallowing Func-speech Pathology  04/10/2015  Objective Swallowing Evaluation:   Patient Details Name:  Emily Mccullough MRN: [id number] Date of Birth: 10/31/1945 Today's Date: 04/10/2015 Time: SLP Start Time (ACUTE ONLY): 1235-SLP Stop Time (ACUTE ONLY): 1310 SLP Time Calculation (min) (ACUTE ONLY): 35 min Past Medical History: Past Medical History Diagnosis Date . PSP (progressive supranuclear palsy) ([place]) 12/09   neuro Dr.Willis, initially dx as parkinson, dx changed to PSP in 2010 . Depression  . GERD (gastroesophageal reflux disease)  . Osteoporosis  . Hyperlipidemia  . Hypothyroidism  . Hypertension  . Chronic back pain  .  Hoarseness  . Colonic polyp  . Postmenopausal status  . SOB (shortness of breath)    s/p a negative Cardio-pulmonary stress test 08-27-04, normal PFT s 2006   . Allergic rhinitis  . Obese    Moderate . Breast CA ([place]) 1996 . Melanoma ([place]) 2006   in situ nose,s/p mohs surgery . Headache(784.0)    migraines  . Severe dysarthria 12/23/2014 Past Surgical History: Past Surgical History Procedure Laterality Date . Mastectomy   . Total abdominal hysterectomy w/ bilateral salpingoophorectomy  1990 . Fracture arm   . Spinal fusion  10/24/07   6 screws . Cataract extraction     right HPI: Pt is a 69 yo female with h/o of depression, hypertension, hypothyroidism, dysphagia PSP presented as a direct admission from Dr. Larose Kells. Patient presented to his office today after feeling poorly with cough for the past 3 weeks. Patient was reportedly weak and sleepy with a low-grade fever at home. While at her PCPs office today, chest x-ray conducted showing pneumonia. Subjective: pt awake in bed= slp assisted to transport pt to xray Assessment / Plan / Recommendation CHL IP CLINICAL IMPRESSIONS 04/10/2015 Therapy Diagnosis Moderately severe oral and moderate pharyngeal dysphagia Clinical Impression Moderately severe oral and moderate pharyngeal dysphagia consistent with symptoms of dysphagia with PSP.  Delayed oral transiting and lingual pumping with mild oral residuals noted due to lingual weakness. Pharyngeal swallow was  moderately weak due to decreased tongue base retraction allowing vallecular more than pyriform sinus residuals with only intermittent sensation.  Pudding residuals precariously lodged on epiglottis without pt awareness.  Liquid consumption and head turn left helpful to decrease residuals.  Pt coughed during testing without barium visualized in larynx to vocal cords/trachea, however suspect aspiration of secretions.  Mild amount of laryngeal penetration noted with thin and nectar due to decreased epiglottic deflection and laryngeal elevation.  Cued cough did marginally decrease penetrates.  Recommend modify diet to maximize airway protection and mitigate aspiration risk.  As pt's cough is grossly weak, this will increase her asp pna risk if she aspirates.  Using live video, educated pt to findings/concerns.  Recommend dys3/thin diet with STRICT precautions.  SlP to follow up to aid in dysphagia.  Pt does admit to recent coughing with liquids x3 weeks.   Impact on safety and function Moderate aspiration risk   CHL IP TREATMENT RECOMMENDATION 04/10/2015 Treatment Recommendations Therapy as outlined in treatment plan below   Prognosis 04/09/2015 Prognosis for Safe Diet Advancement Guarded Barriers to Reach Goals Severity of deficits;Time post onset Barriers/Prognosis Comment -- CHL IP DIET RECOMMENDATION 04/10/2015 SLP Diet Recommendations Dysphagia 3 (Mech soft) solids;Thin liquid Liquid Administration via Cup, straw Medication Administration Crushed with puree Compensations -head turn left, dry swallows, cough Postural Changes -sit upright fully    CHL IP OTHER RECOMMENDATIONS 04/10/2015 Recommended Consults -- Oral Care Recommendations Oral care BID Other Recommendations Clarify dietary restrictions;Have oral suction available   CHL IP FOLLOW UP RECOMMENDATIONS 04/10/2015 Follow up Recommendations Home health SLP   CHL IP FREQUENCY AND DURATION 04/10/2015 Speech Therapy Frequency (ACUTE ONLY) min 2x/week Treatment  Duration 2 weeks      CHL IP ORAL PHASE 04/09/2015 Oral Phase Impaired Oral - Pudding Teaspoon -- Oral - Pudding Cup -- Oral - Honey Teaspoon -- Oral - Honey Cup -- Oral - Nectar Teaspoon Weak lingual manipulation;Lingual pumping;Delayed oral transit;Lingual/palatal residue Oral - Nectar Cup Delayed oral transit;Lingual/palatal residue;Lingual pumping;Weak lingual manipulation Oral - Nectar Straw Weak lingual manipulation;Lingual pumping;Lingual/palatal residue;Delayed oral transit Oral -  Thin Teaspoon Delayed oral transit;Lingual/palatal residue;Lingual pumping;Weak lingual manipulation Oral - Thin Cup Weak lingual manipulation;Lingual pumping;Delayed oral transit Oral - Thin Straw Delayed oral transit;Lingual/palatal residue;Lingual pumping;Weak lingual manipulation Oral - Puree Weak lingual manipulation;Delayed oral transit;Lingual pumping Oral - Mech Soft -- Oral - Regular Impaired mastication;Weak lingual manipulation;Lingual pumping Oral - Multi-Consistency -- Oral - Pill Weak lingual manipulation;Lingual pumping Oral Phase - Comment --  CHL IP PHARYNGEAL PHASE 04/09/2015 Pharyngeal Phase Impaired Pharyngeal- Pudding Teaspoon -- Pharyngeal -- Pharyngeal- Pudding Cup -- Pharyngeal -- Pharyngeal- Honey Teaspoon -- Pharyngeal -- Pharyngeal- Honey Cup -- Pharyngeal -- Pharyngeal- Nectar Teaspoon Reduced epiglottic inversion;Reduced pharyngeal peristalsis;Reduced anterior laryngeal mobility;Reduced laryngeal elevation;Reduced airway/laryngeal closure;Reduced tongue base retraction;Penetration/Aspiration during swallow;Pharyngeal residue - valleculae;Pharyngeal residue - pyriform Pharyngeal Material enters airway, remains ABOVE vocal cords and not ejected out Pharyngeal- Nectar Cup Reduced pharyngeal peristalsis;Reduced epiglottic inversion;Reduced anterior laryngeal mobility;Reduced laryngeal elevation;Reduced airway/laryngeal closure;Reduced tongue base retraction;Pharyngeal residue - valleculae;Pharyngeal residue  - pyriform Pharyngeal -- Pharyngeal- Nectar Straw Reduced airway/laryngeal closure;Reduced laryngeal elevation;Reduced anterior laryngeal mobility;Reduced epiglottic inversion;Reduced pharyngeal peristalsis;Pharyngeal residue - valleculae;Pharyngeal residue - pyriform Pharyngeal -- Pharyngeal- Thin Teaspoon Reduced tongue base retraction;Reduced airway/laryngeal closure;Reduced laryngeal elevation;Reduced anterior laryngeal mobility;Reduced epiglottic inversion;Reduced pharyngeal peristalsis;Penetration/Aspiration during swallow;Pharyngeal residue - valleculae;Pharyngeal residue - pyriform Pharyngeal Material enters airway, remains ABOVE vocal cords and not ejected out Pharyngeal- Thin Cup Reduced pharyngeal peristalsis;Reduced epiglottic inversion;Reduced anterior laryngeal mobility;Reduced laryngeal elevation;Reduced airway/laryngeal closure;Reduced tongue base retraction;Pharyngeal residue - valleculae;Pharyngeal residue - pyriform Pharyngeal -- Pharyngeal- Thin Straw Pharyngeal residue - valleculae;Pharyngeal residue - pyriform;Reduced laryngeal elevation;Reduced anterior laryngeal mobility;Reduced epiglottic inversion;Reduced pharyngeal peristalsis Pharyngeal -- Pharyngeal- Puree Pharyngeal residue - valleculae;Reduced laryngeal elevation;Reduced anterior laryngeal mobility;Reduced epiglottic inversion;Reduced pharyngeal peristalsis;Reduced airway/laryngeal closure;Reduced tongue base retraction;Pharyngeal residue - posterior pharnyx Pharyngeal -- Pharyngeal- Mechanical Soft -- Pharyngeal -- Pharyngeal- Regular Pharyngeal residue - valleculae;Reduced tongue base retraction;Reduced airway/laryngeal closure;Reduced laryngeal elevation;Reduced anterior laryngeal mobility;Reduced epiglottic inversion;Reduced pharyngeal peristalsis Pharyngeal -- Pharyngeal- Multi-consistency -- Pharyngeal -- Pharyngeal- Pill Pharyngeal residue - valleculae;Reduced tongue base retraction;Reduced airway/laryngeal closure;Reduced  laryngeal elevation;Reduced anterior laryngeal mobility;Reduced epiglottic inversion;Reduced pharyngeal peristalsis Pharyngeal -- Pharyngeal Comment head turn left (to pt's weak side per her statement) helpful to decrease residuals  CHL IP CERVICAL ESOPHAGEAL PHASE 04/09/2015 Cervical Esophageal Phase WFL, upon esoph sweep x2, esophagus appeared fully clear!  Pudding Teaspoon -- Pudding Cup -- Honey Teaspoon -- Honey Cup -- Nectar Teaspoon -- Nectar Cup -- Nectar Straw -- Thin Teaspoon -- Thin Cup -- Thin Straw -- Puree -- Mechanical Soft -- Regular -- Multi-consistency -- Pill -- Cervical Esophageal Comment -- Luanna Salk, MS [facility] SLP [phone]               Scheduled Meds: . ipratropium-albuterol  3 mL Nebulization Q6H  . levothyroxine  75 mcg Oral QAC breakfast  . losartan  100 mg Oral Daily  . multivitamin with minerals  1 tablet Oral Daily  . nortriptyline  25 mg Oral QHS  . pantoprazole  40 mg Oral BID  . piperacillin-tazobactam (ZOSYN)  IV  3.375 g Intravenous Q8H  . potassium chloride  40 mEq Oral BID  . sodium chloride  3 mL Intravenous Q12H  . vancomycin  1,000 mg Intravenous Q12H   Continuous Infusions:   Principal Problem:   Pneumonia Active Problems:   Hypothyroidism   Depression   PSP (progressive supranuclear palsy) ([place])   Essential hypertension   GERD   BREAST CANCER, HX OF   CHIU, STEPHEN K  [facility] Pager [phone]. If 7PM-7AM, please contact night-coverage  at [email or web address], password [facility] 04/11/2015, 1:05 PM  LOS: 3 days

## 2015-04-12 LAB — BASIC METABOLIC PANEL
Anion gap: 9 (ref 5–15)
BUN: 12 mg/dL (ref 6–20)
CHLORIDE: 105 mmol/L (ref 101–111)
CO2: 25 mmol/L (ref 22–32)
Calcium: 8.4 mg/dL — ABNORMAL LOW (ref 8.9–10.3)
Creatinine, Ser: 0.63 mg/dL (ref 0.44–1.00)
GFR calc Af Amer: >60 mL/min (ref >60)
GFR calc non Af Amer: >60 mL/min (ref >60)
GLUCOSE: 107 mg/dL — AB (ref 65–99)
POTASSIUM: 3.6 mmol/L (ref 3.5–5.1)
Sodium: 139 mmol/L (ref 135–145)

## 2015-04-12 LAB — CBC
HEMATOCRIT: 24.8 % — AB (ref 36.0–46.0)
HEMOGLOBIN: 7.7 g/dL — AB (ref 12.0–15.0)
MCH: 24.9 pg — AB (ref 26.0–34.0)
MCHC: 31 g/dL (ref 30.0–36.0)
MCV: 80.3 fL (ref 78.0–100.0)
Platelets: 688 10*3/uL — ABNORMAL HIGH (ref 150–400)
RBC: 3.09 MIL/uL — ABNORMAL LOW (ref 3.87–5.11)
RDW: 15.6 % — AB (ref 11.5–15.5)
WBC: 16 10*3/uL — ABNORMAL HIGH (ref 4.0–10.5)

## 2015-04-12 LAB — LEGIONELLA PNEUMOPHILA SEROGP 1 UR AG: L. pneumophila Serogp 1 Ur Ag: NEGATIVE

## 2015-04-12 MED ORDER — AMOXICILLIN-POT CLAVULANATE 875-125 MG PO TABS
1.0000 | ORAL_TABLET | Freq: Two times a day (BID) | ORAL | Status: DC
  Administered 2015-04-12 – 2015-04-13 (×3): 1 via ORAL
  Filled 2015-04-12 (×3): qty 1

## 2015-04-12 NOTE — Progress Notes (Signed)
[facility] PROGRESS NOTE  Emily Mccullough [id number] DOB: June 26, 1945 DOA: 04/08/2015 PCP: Kathlene November, MD  HPI/Brief narrative 69 y.o. female with a history of depression, hypertension, hypothyroidism, PSP presented as a direct admission from Dr. Larose Kells. Patient presented to his office today after feeling poorly with cough for the past 3 weeks prior to admission  Assessment/Plan: Bibasilar pneumonia with sepsis on admission -Chest x-ray showed bilateral pneumonia, primarily on L, with small pleural effusion on the left; radiology recommendations for repeat chest x-ray in 3-4 weeks -Patient was started on empiric azithromycin and ceftriaxone, later changed to vanc and zosyn given sepsis and initial concerns for aspiration -Leukocytosis now improving to 16k -Blood cultures neg, strep pneumonia antigen neg -Legionella neg -Pt evaluated by SLP, recs for dysphagia 3 diet - Repeat cxr with findings of progression of L pneumonia - Clinically, pt seems improved - Will transition to PO augmentin and monitor  Hypothyroidism -Continue Synthroid  Hypertension -Continued losartan -Lasix held until labs obtained  History of breast cancer and melanoma -Stable  Depression -Continue Pamelor  GERD -Continue Protonix  Generalized weakness -Consulted PT and OT  History of PSP -Follows with Dr. Jannifer Franklin, neurology  Code Status: Full Family Communication: Pt in room Disposition Plan: Home pending resolution of sepsis and when pt able to tolerate PO meds   Consultants:    Procedures:    Antibiotics: Anti-infectives    Start     Dose/Rate Route Frequency Ordered Stop   04/12/15 1545  amoxicillin-clavulanate (AUGMENTIN) 875-125 MG per tablet 1 tablet     1 tablet Oral Every 12 hours 04/12/15 1544     04/12/15 0400  vancomycin (VANCOCIN) 1,250 mg in sodium chloride 0.9 % 250 mL IVPB  Status:  Discontinued     1,250 mg 166.7 mL/hr over 90 Minutes Intravenous Every 12 hours  04/11/15 1623 04/12/15 1544   04/09/15 1600  vancomycin (VANCOCIN) IVPB 1000 mg/200 mL premix     1,000 mg 200 mL/hr over 60 Minutes Intravenous Every 12 hours 04/09/15 1513 04/11/15 1738   04/09/15 1600  piperacillin-tazobactam (ZOSYN) IVPB 3.375 g  Status:  Discontinued     3.375 g 12.5 mL/hr over 240 Minutes Intravenous Every 8 hours 04/09/15 1513 04/12/15 1544   04/08/15 1800  cefTRIAXone (ROCEPHIN) 1 g in dextrose 5 % 50 mL IVPB  Status:  Discontinued     1 g 100 mL/hr over 30 Minutes Intravenous Every 24 hours 04/08/15 1604 04/09/15 1452   04/08/15 1545  azithromycin (ZITHROMAX) 500 mg in dextrose 5 % 250 mL IVPB  Status:  Discontinued     500 mg 250 mL/hr over 60 Minutes Intravenous Every 24 hours 04/08/15 1535 04/09/15 1452      HPI/Subjective: Reports feeling better today  Objective: Filed Vitals:   04/11/15 2157 04/12/15 0508 04/12/15 0806 04/12/15 1325  BP: 138/63 138/68  126/59  Pulse: 85 84  93  Temp: 98.6 F (37 C) 98.4 F (36.9 C)  98.5 F (36.9 C)  TempSrc: Oral Oral  Oral  Resp: 20 20  22   Height:      Weight:      SpO2: 99% 97% 93% 92%    Intake/Output Summary (Last 24 hours) at 04/12/15 1545 Last data filed at 04/12/15 1017  Gross per 24 hour  Intake    660 ml  Output      0 ml  Net    660 ml   Filed Weights   04/08/15 1441  Weight: 79.7 kg (175  lb 11.3 oz)    Exam:   General:  Awake, in bed, in nad  Cardiovascular: regular, s1, s2  Respiratory: normal resp effort, no wheezing  Abdomen: soft,nondistended, pos BS  Musculoskeletal: perfused, no cyaonosis  Data Reviewed: Basic Metabolic Panel:  Recent Labs Lab 04/08/15 1600 04/09/15 0535 04/10/15 0545 04/10/15 1115 04/11/15 0515 04/12/15 0455  NA 142 138 141  --  141 139  K 3.6 3.2* 3.1*  --  3.1* 3.6  CL 107 104 107  --  108 105  CO2 23 22 23   --  22 25  GLUCOSE 112* 119* 119*  --  124* 107*  BUN 16 13 14   --  14 12  CREATININE 0.59 0.66 0.61  --  0.65 0.63  CALCIUM 8.8*  8.4* 8.3*  --  8.4* 8.4*  MG  --   --   --  2.0  --   --    Liver Function Tests:  Recent Labs Lab 04/08/15 1600  AST 82*  ALT 140*  ALKPHOS 203*  BILITOT 0.9  PROT 7.2  ALBUMIN 2.6*   No results for input(s): LIPASE, AMYLASE in the last 168 hours. No results for input(s): AMMONIA in the last 168 hours. CBC:  Recent Labs Lab 04/08/15 1600 04/09/15 0535 04/10/15 0545 04/11/15 0515 04/12/15 0455  WBC 28.9* 26.7* 25.5* 23.9* 16.0*  HGB 7.7* 8.2* 7.8* 8.3* 7.7*  HCT 23.6* 25.2* 24.0* 25.7* 24.8*  MCV 81.4 80.3 78.9 80.1 80.3  PLT 621* 678* 639* 655* 688*   Cardiac Enzymes: No results for input(s): CKTOTAL, CKMB, CKMBINDEX, TROPONINI in the last 168 hours. BNP (last 3 results) No results for input(s): BNP in the last 8760 hours.  ProBNP (last 3 results) No results for input(s): PROBNP in the last 8760 hours.  CBG: No results for input(s): GLUCAP in the last 168 hours.  Recent Results (from the past 240 hour(s))  Culture, blood (routine x 2) Call MD if unable to obtain prior to antibiotics being given     Status: None (Preliminary result)   Collection Time: 04/08/15  3:55 PM  Result Value Ref Range Status   Specimen Description BLOOD LEFT ANTECUBITAL  Final   Special Requests BOTTLES DRAWN AEROBIC AND ANAEROBIC 5 CC EA  Final   Culture   Final    NO GROWTH 4 DAYS Performed at [facility]    Report Status PENDING  Incomplete  Culture, blood (routine x 2) Call MD if unable to obtain prior to antibiotics being given     Status: None (Preliminary result)   Collection Time: 04/08/15  4:10 PM  Result Value Ref Range Status   Specimen Description BLOOD LEFT HAND  Final   Special Requests BOTTLES DRAWN AEROBIC AND ANAEROBIC 3 CC EA  Final   Culture   Final    NO GROWTH 4 DAYS Performed at [facility]    Report Status PENDING  Incomplete     Studies: Dg Chest Port 1 View  04/11/2015  CLINICAL DATA:  Short of breath. EXAM: PORTABLE CHEST 1 VIEW  COMPARISON:  04/08/2015 FINDINGS: Progression of left lower lobe infiltrate and left effusion, most likely due to pneumonia. Small area of atelectasis or infiltrate in the right lung base is unchanged. No effusion on the right. Negative for heart failure. IMPRESSION: Progression of left lower lobe infiltrate and left effusion. Probable pneumonia. Electronically Signed   By: Franchot Gallo M.D.   On: 04/11/2015 11:40    Scheduled Meds: .  amoxicillin-clavulanate  1 tablet Oral Q12H  . ipratropium-albuterol  3 mL Nebulization TID  . levothyroxine  75 mcg Oral QAC breakfast  . losartan  100 mg Oral Daily  . multivitamin with minerals  1 tablet Oral Daily  . nortriptyline  25 mg Oral QHS  . pantoprazole  40 mg Oral BID  . sodium chloride  3 mL Intravenous Q12H   Continuous Infusions:   Principal Problem:   Pneumonia Active Problems:   Hypothyroidism   Depression   PSP (progressive supranuclear palsy) ([place])   Essential hypertension   GERD   BREAST CANCER, HX OF   CHIU, STEPHEN K  [facility] Pager [phone]. If 7PM-7AM, please contact night-coverage at [email or web address], password [email or web address] 04/12/2015, 3:45 PM  LOS: 4 days

## 2015-04-12 NOTE — Progress Notes (Signed)
Occupational Therapy Treatment Patient Details Name: Emily Mccullough MRN: [id number] DOB: Apr 19, 1946 Today's Date: 04/12/2015    History of present illness 69 yo female admitted with pneumonia, has a history of depression, hypertension, hypothyroidism, and PSP (progressive supranuclear palsy).    OT comments  Pt is very motivated and making gains in OT.  Will try level one theraband on next visit.  Once seated, maintained sitting balance for 5 minutes with min A, but postural lean to L without physical assistance/cues.   Follow Up Recommendations  SNF    Equipment Recommendations  Hospital bed;Other (comment) (hoyer lift)    Recommendations for Other Services      Precautions / Restrictions Precautions Precautions: Fall Restrictions Weight Bearing Restrictions: No       Mobility Bed Mobility     Rolling: Max assist   Supine to sit: Max assist;+2 for safety/equipment Sit to supine: Total assist;+2 for safety/equipment   General bed mobility comments: assisted with rolling; holds onto rails with bil UEs  Transfers                      Balance                                sitting balance:  Poor, min guard to min A, postural lean to L   ADL                                         General ADL Comments: pt was on bedpan. Rolled to bil sides to remove, change pads and complete hygiene.  Sat EOB for 5 min with min A.  Pt has a tendency to lean more L and takes a lot of support through LUE.  Felt a little dizzy while sitting.  BP 142/78.  Returned to supine for UE exercises.  Per notes, and pt, she has been self feeding, using RUE.  Pt has been able to hold styrofoam cup per her report      Vision                     Perception     Praxis      Cognition   Behavior During Therapy: Flat affect but engaged.     Area of Impairment: Following commands        Following Commands: Follows one step commands with  increased time       General Comments: extra time for pt to respond;    Extremity/Trunk Assessment               Exercises Other Exercises Other Exercises: 5 reps bil shoulder flexion AROM:  will try level one theraband next time Other Exercises: elbow strength 4-/5 Other Exercises: R grip 3/5 weakest with first 3 fingers Other Exercises: L grip 3+/5   Shoulder Instructions       General Comments      Pertinent Vitals/ Pain       Pain Assessment: No/denies pain  Home Living                                          Prior Functioning/Environment  Frequency Min 2X/week     Progress Toward Goals  OT Goals(current goals can now be found in the care plan section)  Progress towards OT goals: Progressing toward goals  Acute Rehab OT Goals Patient Stated Goal: to get stronger  OT Goal Formulation: With patient/family Time For Goal Achievement: 04/23/15 Potential to Achieve Goals: [place]      Co-evaluation                 End of Session     Activity Tolerance Patient tolerated treatment well   Patient Left in bed;with call bell/phone within reach;with bed alarm set   Nurse Communication          Time: 1350-1415 OT Time Calculation (min): 25 min  Charges: OT General Charges $OT Visit: 1 Procedure OT Treatments $Self Care/Home Management : 8-22 mins $Therapeutic Activity: 8-22 mins  Latonya Nelon 04/12/2015, 2:49 PM  Lesle Chris, OTR/L [phone] 04/12/2015

## 2015-04-12 NOTE — Clinical Social Work Note (Signed)
CSW spoke with pt's spouse to provide bed offers.  Pt's spouse stated that he wants a SNF in [place] because he has to have his brother drive him places and wants someplace close.  CSW will check with facilities that have Stated yes to see who has availability this weekend and follow back up with spouse so that we are prepared if pt is medically ready this weekend. Marland Kitchen   Dede Query, LCSW [facility] Worker - Weekend Coverage cell #: [phone]

## 2015-04-13 DIAGNOSIS — E876 Hypokalemia: Secondary | ICD-10-CM | POA: Diagnosis not present

## 2015-04-13 DIAGNOSIS — R0602 Shortness of breath: Secondary | ICD-10-CM | POA: Diagnosis not present

## 2015-04-13 DIAGNOSIS — F329 Major depressive disorder, single episode, unspecified: Secondary | ICD-10-CM | POA: Diagnosis not present

## 2015-04-13 DIAGNOSIS — A419 Sepsis, unspecified organism: Secondary | ICD-10-CM | POA: Diagnosis not present

## 2015-04-13 DIAGNOSIS — M545 Low back pain: Secondary | ICD-10-CM | POA: Diagnosis not present

## 2015-04-13 DIAGNOSIS — J189 Pneumonia, unspecified organism: Secondary | ICD-10-CM | POA: Diagnosis not present

## 2015-04-13 DIAGNOSIS — R5381 Other malaise: Secondary | ICD-10-CM | POA: Diagnosis not present

## 2015-04-13 DIAGNOSIS — E038 Other specified hypothyroidism: Secondary | ICD-10-CM | POA: Diagnosis not present

## 2015-04-13 DIAGNOSIS — N951 Menopausal and female climacteric states: Secondary | ICD-10-CM | POA: Diagnosis not present

## 2015-04-13 DIAGNOSIS — K219 Gastro-esophageal reflux disease without esophagitis: Secondary | ICD-10-CM | POA: Diagnosis not present

## 2015-04-13 DIAGNOSIS — G231 Progressive supranuclear ophthalmoplegia [Steele-Richardson-Olszewski]: Secondary | ICD-10-CM | POA: Diagnosis not present

## 2015-04-13 DIAGNOSIS — M810 Age-related osteoporosis without current pathological fracture: Secondary | ICD-10-CM | POA: Diagnosis not present

## 2015-04-13 DIAGNOSIS — J309 Allergic rhinitis, unspecified: Secondary | ICD-10-CM | POA: Diagnosis not present

## 2015-04-13 DIAGNOSIS — Z853 Personal history of malignant neoplasm of breast: Secondary | ICD-10-CM | POA: Diagnosis not present

## 2015-04-13 DIAGNOSIS — E785 Hyperlipidemia, unspecified: Secondary | ICD-10-CM | POA: Diagnosis not present

## 2015-04-13 DIAGNOSIS — C7981 Secondary malignant neoplasm of breast: Secondary | ICD-10-CM | POA: Diagnosis not present

## 2015-04-13 DIAGNOSIS — M549 Dorsalgia, unspecified: Secondary | ICD-10-CM | POA: Diagnosis not present

## 2015-04-13 DIAGNOSIS — I10 Essential (primary) hypertension: Secondary | ICD-10-CM | POA: Diagnosis not present

## 2015-04-13 DIAGNOSIS — D649 Anemia, unspecified: Secondary | ICD-10-CM | POA: Diagnosis not present

## 2015-04-13 DIAGNOSIS — E039 Hypothyroidism, unspecified: Secondary | ICD-10-CM | POA: Diagnosis not present

## 2015-04-13 DIAGNOSIS — R471 Dysarthria and anarthria: Secondary | ICD-10-CM | POA: Diagnosis not present

## 2015-04-13 DIAGNOSIS — J168 Pneumonia due to other specified infectious organisms: Secondary | ICD-10-CM | POA: Diagnosis not present

## 2015-04-13 DIAGNOSIS — E034 Atrophy of thyroid (acquired): Secondary | ICD-10-CM | POA: Diagnosis not present

## 2015-04-13 DIAGNOSIS — R0989 Other specified symptoms and signs involving the circulatory and respiratory systems: Secondary | ICD-10-CM | POA: Diagnosis not present

## 2015-04-13 LAB — CBC
HCT: 25.2 % — ABNORMAL LOW (ref 36.0–46.0)
Hemoglobin: 7.9 g/dL — ABNORMAL LOW (ref 12.0–15.0)
MCH: 25.1 pg — AB (ref 26.0–34.0)
MCHC: 31.3 g/dL (ref 30.0–36.0)
MCV: 80 fL (ref 78.0–100.0)
PLATELETS: 704 10*3/uL — AB (ref 150–400)
RBC: 3.15 MIL/uL — AB (ref 3.87–5.11)
RDW: 15.6 % — AB (ref 11.5–15.5)
WBC: 13.3 10*3/uL — AB (ref 4.0–10.5)

## 2015-04-13 LAB — COMPREHENSIVE METABOLIC PANEL
ALK PHOS: 187 U/L — AB (ref 38–126)
ALT: 117 U/L — AB (ref 14–54)
AST: 49 U/L — ABNORMAL HIGH (ref 15–41)
Albumin: 2.1 g/dL — ABNORMAL LOW (ref 3.5–5.0)
Anion gap: 11 (ref 5–15)
BILIRUBIN TOTAL: 0.2 mg/dL — AB (ref 0.3–1.2)
BUN: 11 mg/dL (ref 6–20)
CALCIUM: 8.2 mg/dL — AB (ref 8.9–10.3)
CO2: 21 mmol/L — ABNORMAL LOW (ref 22–32)
CREATININE: 0.57 mg/dL (ref 0.44–1.00)
Chloride: 102 mmol/L (ref 101–111)
Glucose, Bld: 110 mg/dL — ABNORMAL HIGH (ref 65–99)
Potassium: 3.1 mmol/L — ABNORMAL LOW (ref 3.5–5.1)
Sodium: 134 mmol/L — ABNORMAL LOW (ref 135–145)
TOTAL PROTEIN: 6.5 g/dL (ref 6.5–8.1)

## 2015-04-13 LAB — CULTURE, BLOOD (ROUTINE X 2)
CULTURE: NO GROWTH
Culture: NO GROWTH

## 2015-04-13 MED ORDER — OXYCODONE HCL 15 MG PO TABS: 15.0000 mg | ORAL_TABLET | Freq: Four times a day (QID) | ORAL | Status: DC | PRN

## 2015-04-13 MED ORDER — AMOXICILLIN-POT CLAVULANATE 875-125 MG PO TABS: 1.0000 | ORAL_TABLET | Freq: Two times a day (BID) | ORAL | Status: DC

## 2015-04-13 MED ORDER — POTASSIUM CHLORIDE CRYS ER 20 MEQ PO TBCR
40.0000 meq | EXTENDED_RELEASE_TABLET | Freq: Two times a day (BID) | ORAL | Status: DC
  Administered 2015-04-13: 40 meq via ORAL
  Filled 2015-04-13: qty 2

## 2015-04-13 NOTE — Clinical Social Work Placement (Signed)
   CLINICAL SOCIAL WORK PLACEMENT  NOTE  Date:  04/13/2015  Patient Details  Name: Emily Mccullough MRN: [id number] Date of Birth: [birth year]  Clinical Social Work is seeking post-discharge placement for this patient at the [place] level of care (*CSW will initial, date and re-position this form in  chart as items are completed):  Yes   Patient/family provided with [place] Work Department's list of facilities offering this level of care within the geographic area requested by the patient (or if unable, by the patient's family).  Yes   Patient/family informed of their freedom to choose among providers that offer the needed level of care, that participate in Medicare, Medicaid or managed care program needed by the patient, have an available bed and are willing to accept the patient.  Yes   Patient/family informed of [facility]'s ownership interest in [facility] and [facility], as well as of the fact that they are under no obligation to receive care at these facilities.  PASRR submitted to EDS on 04/11/15     PASRR number received on 04/11/15     Existing PASRR number confirmed on       FL2 transmitted to all facilities in geographic area requested by pt/family on 04/11/15     FL2 transmitted to all facilities within larger geographic area on       Patient informed that his/her managed care company has contracts with or will negotiate with certain facilities, including the following:            Patient/family informed of bed offers received.  Patient chooses bed at     [facility]  Physician recommends and patient chooses bed at      Patient to be transferred to  [facility] on  .April 13, 2015  Patient to be transferred to facility by   ambulance    Patient family notified on   April 13 2015 of transfer.  Name of family member notified:     [place]/husband   PHYSICIAN Please sign FL2     Additional Comment:     _______________________________________________ Carlean Jews, LCSW 04/13/2015, 11:46 AM

## 2015-04-13 NOTE — Clinical Social Work Note (Signed)
CSW acquired a SNF bed at [facility] for pt today if she is ready for discharge today.  CSW left message with pt's spouse to coordinate discharge.  Waiting to hear back from spouse to coordinate paperwork at facility  .Dede Query, LCSW [facility] Clinical Social Worker - Weekend Coverage cell #: [phone]

## 2015-04-13 NOTE — Progress Notes (Signed)
Report called to [facility] and talked with [place],

## 2015-04-13 NOTE — Discharge Summary (Signed)
Physician Discharge Summary  Emily Mccullough [id number] DOB: Nov 03, 1945 DOA: 04/08/2015  PCP: Kathlene November, MD  Admit date: 04/08/2015 Discharge date: 04/13/2015  Time spent: 20 minutes  Recommendations for Outpatient Follow-up:  1. Follow up with PCP in 1-2 weeks  2. Please repeat CXR in 3-4 weeks to ensure resolution of PNA  Discharge Diagnoses:  Principal Problem:   Pneumonia Active Problems:   Hypothyroidism   Depression   PSP (progressive supranuclear palsy) ([place])   Essential hypertension   GERD   BREAST CANCER, HX OF   Discharge Condition: Improved  Diet recommendation:  -Dysphagia 3 with thin liquids, Grounds meats, extra gravy/sauce     -Medication Administration: Whole meds with puree     -Supervision: Intermittent supervision to cue for compensatory     strategies (set up assist)     -Compensations: Slow rate;Small sips/bites (start meals with liquids,     intermittent cough)     -Postural Changes and/or Swallow Maneuvers: Seated upright 90     degrees;Upright 30-60 min after meal  Filed Weights   04/08/15 1441  Weight: 79.7 kg (175 lb 11.3 oz)    History of present illness:  Please review dictated H and P from 11/22 for details. Briefly, 69 y.o. female with a history of depression, hypertension, hypothyroidism, PSP presented as a direct admission from Dr. Larose Kells. Patient presented to his office today after feeling poorly with cough for the past 3 weeks prior to admission  Hospital Course:  Bibasilar pneumonia with sepsis on admission -Chest x-ray showed bilateral pneumonia, primarily on L, with small pleural effusion on the left; radiology recommendations for repeat chest x-ray in 3-4 weeks -Patient was started on empiric azithromycin and ceftriaxone, later changed to vanc and zosyn given sepsis and initial concerns for aspiration -Leukocytosis now improving to 13k on discharge (peak of 28.9 on admit) -Blood cultures neg, strep pneumonia antigen  neg -Legionella neg -Pt evaluated by SLP, recs for dysphagia 3 diet - Repeat cxr with findings of progression of L pneumonia - Clinically, pt seems much improved - Transitioned to PO augmentin with continued improvement  Hypothyroidism -Continue Synthroid  Hypertension -Continued losartan -Lasix held until labs obtained  History of breast cancer and melanoma -Stable  Depression -Continue Pamelor  GERD -Continue Protonix  Generalized weakness -Consulted PT and OT with recs for SNF  History of PSP -Follows with Dr. Jannifer [place], neurology -Discussed with patient's husband over phone. He reports strength had been gradually worsening with no acute neurologic changes noted. Also, pt's husband reports overall picture seems to be patient's baseline with exception of increased tiredness   Discharge Exam: Filed Vitals:   04/12/15 1958 04/12/15 2024 04/13/15 0609 04/13/15 0806  BP:  139/67 143/77   Pulse:  99 80   Temp:  98.1 F (36.7 C) 97.7 F (36.5 C)   TempSrc:  Oral Oral   Resp:  22 22   Height:      Weight:      SpO2: 94% 96% 95% 93%    General: Awake, in nad Cardiovascular: regular, s1, s2 Respiratory: normal resp effort, no wheezing  Discharge Instructions     Medication List    TAKE these medications        acetaminophen 500 MG tablet  Commonly known as:  TYLENOL  Take 500 mg by mouth every 6 (six) hours as needed for moderate pain.     AMBULATORY NON FORMULARY MEDICATION  Incentive Spirometer Dx: G23.1 (PSP)     amoxicillin-clavulanate 875-125 MG  tablet  Commonly known as:  AUGMENTIN  Take 1 tablet by mouth every 12 (twelve) hours.     CALCIUM-VITAMIN D PO  Take 1 tablet by mouth daily.     furosemide 20 MG tablet  Commonly known as:  LASIX  Take 2 tablets (40 mg total) by mouth daily.     ibuprofen 200 MG tablet  Commonly known as:  ADVIL,MOTRIN  Take 600 mg by mouth every 6 (six) hours as needed for moderate pain.     levothyroxine 75 MCG  tablet  Commonly known as:  SYNTHROID, LEVOTHROID  Take 1 tablet (75 mcg total) by mouth daily before breakfast.     losartan 100 MG tablet  Commonly known as:  COZAAR  Take 1 tablet (100 mg total) by mouth daily.     MAGNESIUM PO  Take 1 tablet by mouth every evening.     multivitamin capsule  Take 1 capsule by mouth daily.     nortriptyline 25 MG capsule  Commonly known as:  PAMELOR  Take 1 capsule (25 mg total) by mouth at bedtime.     oxyCODONE 15 MG immediate release tablet  Commonly known as:  ROXICODONE  Take 1 tablet (15 mg total) by mouth every 6 (six) hours as needed for pain (Must last 28 days).     oxyCODONE 15 MG immediate release tablet  Commonly known as:  ROXICODONE  Take 1 tablet (15 mg total) by mouth every 6 (six) hours as needed for moderate pain.     pantoprazole 40 MG tablet  Commonly known as:  PROTONIX  Take 1 tablet (40 mg total) by mouth 2 (two) times daily.     potassium chloride SA 20 MEQ tablet  Commonly known as:  K-DUR,KLOR-CON  Take 2 tablets (40 mEq total) by mouth 2 (two) times daily.       Allergies  Allergen Reactions  . Zostavax [Zoster Vaccine Live]     Unable to take d/t Neosporin allergy  . Neosporin [Neomycin-Bacitracin Zn-Polymyx] Rash      The results of significant diagnostics from this hospitalization (including imaging, microbiology, ancillary and laboratory) are listed below for reference.    Significant Diagnostic Studies: Dg Chest 2 View  04/08/2015  CLINICAL DATA:  Cough and fever.  Evaluate for pneumonia EXAM: CHEST  2 VIEW COMPARISON:  05/15/2008 FINDINGS: Dense airspace disease at the left base with pleural effusion. No cavitary finding. There is likely also airspace disease at the medial right base. No edema or air leak. Normal non obscured heart size. IMPRESSION: 1. Bibasilar pneumonia, more extensive on the left where there is small pleural effusion. 2. Followup PA and lateral chest X-ray is recommended in 3-4  weeks following trial of antibiotic therapy to ensure resolution and exclude underlying malignancy. Electronically Signed   By: Monte Fantasia M.D.   On: 04/08/2015 12:29   Dg Chest Port 1 View  04/11/2015  CLINICAL DATA:  Short of breath. EXAM: PORTABLE CHEST 1 VIEW COMPARISON:  04/08/2015 FINDINGS: Progression of left lower lobe infiltrate and left effusion, most likely due to pneumonia. Small area of atelectasis or infiltrate in the right lung base is unchanged. No effusion on the right. Negative for heart failure. IMPRESSION: Progression of left lower lobe infiltrate and left effusion. Probable pneumonia. Electronically Signed   By: Franchot Gallo M.D.   On: 04/11/2015 11:40   Dg Swallowing Func-speech Pathology  04/10/2015  Objective Swallowing Evaluation:   Patient Details Name: ELIANNAH BOROWICZ MRN: [id number]  Date of Birth: 09-Dec-1945 Today's Date: 04/10/2015 Time: SLP Start Time (ACUTE ONLY): 1235-SLP Stop Time (ACUTE ONLY): 1310 SLP Time Calculation (min) (ACUTE ONLY): 35 min Past Medical History: Past Medical History Diagnosis Date . PSP (progressive supranuclear palsy) ([place]) 12/09   neuro Dr.Willis, initially dx as parkinson, dx changed to PSP in 2010 . Depression  . GERD (gastroesophageal reflux disease)  . Osteoporosis  . Hyperlipidemia  . Hypothyroidism  . Hypertension  . Chronic back pain  . Hoarseness  . Colonic polyp  . Postmenopausal status  . SOB (shortness of breath)    s/p a negative Cardio-pulmonary stress test 08-27-04, normal PFT s 2006   . Allergic rhinitis  . Obese    Moderate . Breast CA ([place]) 1996 . Melanoma ([place]) 2006   in situ nose,s/p mohs surgery . Headache(784.0)    migraines  . Severe dysarthria 12/23/2014 Past Surgical History: Past Surgical History Procedure Laterality Date . Mastectomy   . Total abdominal hysterectomy w/ bilateral salpingoophorectomy  1990 . Fracture arm   . Spinal fusion  10/24/07   6 screws . Cataract extraction     right HPI: Pt is a 69 yo female with h/o  of depression, hypertension, hypothyroidism, dysphagia PSP presented as a direct admission from Dr. Larose Kells. Patient presented to his office today after feeling poorly with cough for the past 3 weeks. Patient was reportedly weak and sleepy with a low-grade fever at home. While at her PCPs office today, chest x-ray conducted showing pneumonia. Subjective: pt awake in bed= slp assisted to transport pt to xray Assessment / Plan / Recommendation CHL IP CLINICAL IMPRESSIONS 04/10/2015 Therapy Diagnosis Moderately severe oral and moderate pharyngeal dysphagia Clinical Impression Moderately severe oral and moderate pharyngeal dysphagia consistent with symptoms of dysphagia with PSP.  Delayed oral transiting and lingual pumping with mild oral residuals noted due to lingual weakness. Pharyngeal swallow was moderately weak due to decreased tongue base retraction allowing vallecular more than pyriform sinus residuals with only intermittent sensation.  Pudding residuals precariously lodged on epiglottis without pt awareness.  Liquid consumption and head turn left helpful to decrease residuals.  Pt coughed during testing without barium visualized in larynx to vocal cords/trachea, however suspect aspiration of secretions.  Mild amount of laryngeal penetration noted with thin and nectar due to decreased epiglottic deflection and laryngeal elevation.  Cued cough did marginally decrease penetrates.  Recommend modify diet to maximize airway protection and mitigate aspiration risk.  As pt's cough is grossly weak, this will increase her asp pna risk if she aspirates.  Using live video, educated pt to findings/concerns.  Recommend dys3/thin diet with STRICT precautions.  SlP to follow up to aid in dysphagia.  Pt does admit to recent coughing with liquids x3 weeks.   Impact on safety and function Moderate aspiration risk   CHL IP TREATMENT RECOMMENDATION 04/10/2015 Treatment Recommendations Therapy as outlined in treatment plan below    Prognosis 04/09/2015 Prognosis for Safe Diet Advancement Guarded Barriers to Reach Goals Severity of deficits;Time post onset Barriers/Prognosis Comment -- CHL IP DIET RECOMMENDATION 04/10/2015 SLP Diet Recommendations Dysphagia 3 (Mech soft) solids;Thin liquid Liquid Administration via Cup, straw Medication Administration Crushed with puree Compensations -head turn left, dry swallows, cough Postural Changes -sit upright fully    CHL IP OTHER RECOMMENDATIONS 04/10/2015 Recommended Consults -- Oral Care Recommendations Oral care BID Other Recommendations Clarify dietary restrictions;Have oral suction available   CHL IP FOLLOW UP RECOMMENDATIONS 04/10/2015 Follow up Recommendations Home health SLP   CHL IP  FREQUENCY AND DURATION 04/10/2015 Speech Therapy Frequency (ACUTE ONLY) min 2x/week Treatment Duration 2 weeks      CHL IP ORAL PHASE 04/09/2015 Oral Phase Impaired Oral - Pudding Teaspoon -- Oral - Pudding Cup -- Oral - Honey Teaspoon -- Oral - Honey Cup -- Oral - Nectar Teaspoon Weak lingual manipulation;Lingual pumping;Delayed oral transit;Lingual/palatal residue Oral - Nectar Cup Delayed oral transit;Lingual/palatal residue;Lingual pumping;Weak lingual manipulation Oral - Nectar Straw Weak lingual manipulation;Lingual pumping;Lingual/palatal residue;Delayed oral transit Oral - Thin Teaspoon Delayed oral transit;Lingual/palatal residue;Lingual pumping;Weak lingual manipulation Oral - Thin Cup Weak lingual manipulation;Lingual pumping;Delayed oral transit Oral - Thin Straw Delayed oral transit;Lingual/palatal residue;Lingual pumping;Weak lingual manipulation Oral - Puree Weak lingual manipulation;Delayed oral transit;Lingual pumping Oral - Mech Soft -- Oral - Regular Impaired mastication;Weak lingual manipulation;Lingual pumping Oral - Multi-Consistency -- Oral - Pill Weak lingual manipulation;Lingual pumping Oral Phase - Comment --  CHL IP PHARYNGEAL PHASE 04/09/2015 Pharyngeal Phase Impaired Pharyngeal- Pudding  Teaspoon -- Pharyngeal -- Pharyngeal- Pudding Cup -- Pharyngeal -- Pharyngeal- Honey Teaspoon -- Pharyngeal -- Pharyngeal- Honey Cup -- Pharyngeal -- Pharyngeal- Nectar Teaspoon Reduced epiglottic inversion;Reduced pharyngeal peristalsis;Reduced anterior laryngeal mobility;Reduced laryngeal elevation;Reduced airway/laryngeal closure;Reduced tongue base retraction;Penetration/Aspiration during swallow;Pharyngeal residue - valleculae;Pharyngeal residue - pyriform Pharyngeal Material enters airway, remains ABOVE vocal cords and not ejected out Pharyngeal- Nectar Cup Reduced pharyngeal peristalsis;Reduced epiglottic inversion;Reduced anterior laryngeal mobility;Reduced laryngeal elevation;Reduced airway/laryngeal closure;Reduced tongue base retraction;Pharyngeal residue - valleculae;Pharyngeal residue - pyriform Pharyngeal -- Pharyngeal- Nectar Straw Reduced airway/laryngeal closure;Reduced laryngeal elevation;Reduced anterior laryngeal mobility;Reduced epiglottic inversion;Reduced pharyngeal peristalsis;Pharyngeal residue - valleculae;Pharyngeal residue - pyriform Pharyngeal -- Pharyngeal- Thin Teaspoon Reduced tongue base retraction;Reduced airway/laryngeal closure;Reduced laryngeal elevation;Reduced anterior laryngeal mobility;Reduced epiglottic inversion;Reduced pharyngeal peristalsis;Penetration/Aspiration during swallow;Pharyngeal residue - valleculae;Pharyngeal residue - pyriform Pharyngeal Material enters airway, remains ABOVE vocal cords and not ejected out Pharyngeal- Thin Cup Reduced pharyngeal peristalsis;Reduced epiglottic inversion;Reduced anterior laryngeal mobility;Reduced laryngeal elevation;Reduced airway/laryngeal closure;Reduced tongue base retraction;Pharyngeal residue - valleculae;Pharyngeal residue - pyriform Pharyngeal -- Pharyngeal- Thin Straw Pharyngeal residue - valleculae;Pharyngeal residue - pyriform;Reduced laryngeal elevation;Reduced anterior laryngeal mobility;Reduced epiglottic  inversion;Reduced pharyngeal peristalsis Pharyngeal -- Pharyngeal- Puree Pharyngeal residue - valleculae;Reduced laryngeal elevation;Reduced anterior laryngeal mobility;Reduced epiglottic inversion;Reduced pharyngeal peristalsis;Reduced airway/laryngeal closure;Reduced tongue base retraction;Pharyngeal residue - posterior pharnyx Pharyngeal -- Pharyngeal- Mechanical Soft -- Pharyngeal -- Pharyngeal- Regular Pharyngeal residue - valleculae;Reduced tongue base retraction;Reduced airway/laryngeal closure;Reduced laryngeal elevation;Reduced anterior laryngeal mobility;Reduced epiglottic inversion;Reduced pharyngeal peristalsis Pharyngeal -- Pharyngeal- Multi-consistency -- Pharyngeal -- Pharyngeal- Pill Pharyngeal residue - valleculae;Reduced tongue base retraction;Reduced airway/laryngeal closure;Reduced laryngeal elevation;Reduced anterior laryngeal mobility;Reduced epiglottic inversion;Reduced pharyngeal peristalsis Pharyngeal -- Pharyngeal Comment head turn left (to pt's weak side per her statement) helpful to decrease residuals  CHL IP CERVICAL ESOPHAGEAL PHASE 04/09/2015 Cervical Esophageal Phase WFL, upon esoph sweep x2, esophagus appeared fully clear!  Pudding Teaspoon -- Pudding Cup -- Honey Teaspoon -- Honey Cup -- Nectar Teaspoon -- Nectar Cup -- Nectar Straw -- Thin Teaspoon -- Thin Cup -- Thin Straw -- Puree -- Mechanical Soft -- Regular -- Multi-consistency -- Pill -- Cervical Esophageal Comment -- Luanna Salk, MS [facility] SLP [phone]               Microbiology: Recent Results (from the past 240 hour(s))  Culture, blood (routine x 2) Call MD if unable to obtain prior to antibiotics being given     Status: None   Collection Time: 04/08/15  3:55 PM  Result Value Ref Range Status   Specimen Description BLOOD LEFT ANTECUBITAL  Final   Special Requests BOTTLES DRAWN  AEROBIC AND ANAEROBIC 5 CC EA  Final   Culture   Final    NO GROWTH 5 DAYS Performed at [facility]    Report Status  04/13/2015 FINAL  Final  Culture, blood (routine x 2) Call MD if unable to obtain prior to antibiotics being given     Status: None   Collection Time: 04/08/15  4:10 PM  Result Value Ref Range Status   Specimen Description BLOOD LEFT HAND  Final   Special Requests BOTTLES DRAWN AEROBIC AND ANAEROBIC 3 CC EA  Final   Culture   Final    NO GROWTH 5 DAYS Performed at [facility]    Report Status 04/13/2015 FINAL  Final     Labs: Basic Metabolic Panel:  Recent Labs Lab 04/09/15 0535 04/10/15 0545 04/10/15 1115 04/11/15 0515 04/12/15 0455 04/13/15 0540  NA 138 141  --  141 139 134*  K 3.2* 3.1*  --  3.1* 3.6 3.1*  CL 104 107  --  108 105 102  CO2 22 23  --  22 25 21*  GLUCOSE 119* 119*  --  124* 107* 110*  BUN 13 14  --  14 12 11   CREATININE 0.66 0.61  --  0.65 0.63 0.57  CALCIUM 8.4* 8.3*  --  8.4* 8.4* 8.2*  MG  --   --  2.0  --   --   --    Liver Function Tests:  Recent Labs Lab 04/08/15 1600 04/13/15 0540  AST 82* 49*  ALT 140* 117*  ALKPHOS 203* 187*  BILITOT 0.9 0.2*  PROT 7.2 6.5  ALBUMIN 2.6* 2.1*   No results for input(s): LIPASE, AMYLASE in the last 168 hours. No results for input(s): AMMONIA in the last 168 hours. CBC:  Recent Labs Lab 04/09/15 0535 04/10/15 0545 04/11/15 0515 04/12/15 0455 04/13/15 0540  WBC 26.7* 25.5* 23.9* 16.0* 13.3*  HGB 8.2* 7.8* 8.3* 7.7* 7.9*  HCT 25.2* 24.0* 25.7* 24.8* 25.2*  MCV 80.3 78.9 80.1 80.3 80.0  PLT 678* 639* 655* 688* 704*   Cardiac Enzymes: No results for input(s): CKTOTAL, CKMB, CKMBINDEX, TROPONINI in the last 168 hours. BNP: BNP (last 3 results) No results for input(s): BNP in the last 8760 hours.  ProBNP (last 3 results) No results for input(s): PROBNP in the last 8760 hours.  CBG: No results for input(s): GLUCAP in the last 168 hours.   Signed:  Erendira Crabtree K  [facility] 04/13/2015, 11:16 AM

## 2015-04-13 NOTE — Clinical Social Work Note (Signed)
CSW spoke with pt's spouse and provided him with location of SNF bed for pt today.  [facility] is the SNF that has a bed and pt will have his brother take him to sign the paperwork this afternoon between 1 and 1:30 pm.  CSW will confirm this time with SNF and prepare discharge packet when summary is completed.  Dede Query, LCSW [facility] - Weekend Coverage cell #: [phone]

## 2015-04-13 NOTE — Clinical Social Work Note (Signed)
CSW called [facility] for pt transport to [facility].  Pt's husband is going ahead to sign intake paperwork. Packet was provided to [employer]  .Dede Query, LCSW [facility] Clinical Social Worker - Weekend Coverage cell #: [phone]

## 2015-04-14 ENCOUNTER — Non-Acute Institutional Stay (SKILLED_NURSING_FACILITY): Payer: Medicare Other | Admitting: Adult Health

## 2015-04-14 ENCOUNTER — Encounter: Payer: Self-pay | Admitting: Adult Health

## 2015-04-14 DIAGNOSIS — D649 Anemia, unspecified: Secondary | ICD-10-CM

## 2015-04-14 DIAGNOSIS — J189 Pneumonia, unspecified organism: Secondary | ICD-10-CM | POA: Diagnosis not present

## 2015-04-14 DIAGNOSIS — E034 Atrophy of thyroid (acquired): Secondary | ICD-10-CM | POA: Diagnosis not present

## 2015-04-14 DIAGNOSIS — F329 Major depressive disorder, single episode, unspecified: Secondary | ICD-10-CM | POA: Diagnosis not present

## 2015-04-14 DIAGNOSIS — M545 Low back pain: Secondary | ICD-10-CM | POA: Diagnosis not present

## 2015-04-14 DIAGNOSIS — I10 Essential (primary) hypertension: Secondary | ICD-10-CM | POA: Diagnosis not present

## 2015-04-14 DIAGNOSIS — A419 Sepsis, unspecified organism: Secondary | ICD-10-CM | POA: Diagnosis not present

## 2015-04-14 DIAGNOSIS — E038 Other specified hypothyroidism: Secondary | ICD-10-CM | POA: Diagnosis not present

## 2015-04-14 DIAGNOSIS — E876 Hypokalemia: Secondary | ICD-10-CM

## 2015-04-14 DIAGNOSIS — G231 Progressive supranuclear ophthalmoplegia [Steele-Richardson-Olszewski]: Secondary | ICD-10-CM

## 2015-04-14 DIAGNOSIS — F32A Depression, unspecified: Secondary | ICD-10-CM

## 2015-04-14 NOTE — Progress Notes (Signed)
Patient ID: Emily Mccullough, female   DOB: [birth year], 69 y.o.   MRN: [id number]   Facility: [facility]      Allergies  Allergen Reactions  . Zostavax [Zoster Vaccine Live]     Unable to take d/t Neosporin allergy  . Neosporin [Neomycin-Bacitracin Zn-Polymyx] Rash    Chief Complaint  Patient presents with  . Hospitalization Follow-up    HPI:  She has been hospitalized for bibasilar pneumonia and sepsis. Her wbc max was 28.9; upon discharge was 13.3. She is unable to fully participate in the hpi or ros. She is here for short term rehab. More than likely this does represent a long term placement for her. There are no nursing concerns at this time.    Past Medical History  Diagnosis Date  . PSP (progressive supranuclear palsy) ([place]) 12/09    neuro Dr.Willis, initially dx as parkinson, dx changed to PSP in 2010  . Depression   . GERD (gastroesophageal reflux disease)   . Osteoporosis   . Hyperlipidemia   . Hypothyroidism   . Hypertension   . Chronic back pain   . Hoarseness   . Colonic polyp   . Postmenopausal status   . SOB (shortness of breath)     s/p a negative Cardio-pulmonary stress test 08-27-04, normal PFT s 2006    . Allergic rhinitis   . Obese     Moderate  . Breast CA ([place]) 1996  . Melanoma ([place]) 2006    in situ nose,s/p mohs surgery  . Headache(784.0)     migraines   . Severe dysarthria 12/23/2014    Past Surgical History  Procedure Laterality Date  . Mastectomy    . Total abdominal hysterectomy w/ bilateral salpingoophorectomy  1990  . Fracture arm    . Spinal fusion  10/24/07    6 screws  . Cataract extraction      right    VITAL SIGNS BP 138/76 mmHg  Pulse 69  Ht _0  (1.676 m)  Wt 175 lb (79.379 kg)  BMI 28.26 kg/m2  SpO2 95%  Patient's Medications  New Prescriptions   No medications on file  Previous Medications   ACETAMINOPHEN (TYLENOL) 500 MG TABLET    Take 500 mg by mouth every 6 (six) hours as needed for moderate pain.   AMBULATORY NON FORMULARY MEDICATION    Incentive Spirometer Dx: G23.1 (PSP)   AMOXICILLIN-CLAVULANATE (AUGMENTIN) 875-125 MG TABLET    Take 1 tablet by mouth every 12 (twelve) hours.   CALCIUM-VITAMIN D PO    Take 1 tablet by mouth daily.    FUROSEMIDE (LASIX) 20 MG TABLET    Take 2 tablets (40 mg total) by mouth daily.   IBUPROFEN (ADVIL,MOTRIN) 200 MG TABLET    Take 600 mg by mouth every 6 (six) hours as needed for moderate pain.    LEVOTHYROXINE (SYNTHROID, LEVOTHROID) 75 MCG TABLET    Take 1 tablet (75 mcg total) by mouth daily before breakfast.   LOSARTAN (COZAAR) 100 MG TABLET    Take 1 tablet (100 mg total) by mouth daily.   MAGNESIUM PO    Take 1 tablet by mouth every evening.   MULTIPLE VITAMIN (MULTIVITAMIN) CAPSULE    Take 1 capsule by mouth daily.     NORTRIPTYLINE (PAMELOR) 25 MG CAPSULE    Take 1 capsule (25 mg total) by mouth at bedtime.   OXYCODONE (ROXICODONE) 15 MG IMMEDIATE RELEASE TABLET    Take 1 tablet (15 mg total) by mouth every  6 (six) hours as needed for pain (Must last 28 days).   OXYCODONE (ROXICODONE) 15 MG IMMEDIATE RELEASE TABLET    Take 1 tablet (15 mg total) by mouth every 6 (six) hours as needed for moderate pain.   PANTOPRAZOLE (PROTONIX) 40 MG TABLET    Take 1 tablet (40 mg total) by mouth 2 (two) times daily.   POTASSIUM CHLORIDE SA (K-DUR,KLOR-CON) 20 MEQ TABLET    Take 2 tablets (40 mEq total) by mouth 2 (two) times daily.  Modified Medications   No medications on file  Discontinued Medications   No medications on file     SIGNIFICANT DIAGNOSTIC EXAMS  04-08-15: chest x-ray: 1. Bibasilar pneumonia, more extensive on the left where there is small pleural effusion. 2. Followup PA and lateral chest X-ray is recommended in 3-4 weeks following trial of antibiotic therapy to ensure resolution and exclude underlying malignancy.  04-10-15: swallow study: Dysphagia 3 (Mech soft) solids;Thin liquid   04-11-15: chest x-ray: Progression of left lower lobe  infiltrate and left effusion. Probable pneumonia.    LABS REVIEWED:   04-08-15: wbc 28.9; hgb 7.7; hct 23.6; mcv 81.4; plt 621; glucose 112; bun 16; creat 0.59; k+ 3.6; na++142; ast 83; alt 140; alk phos 203; albumin 2.6; HIV: nr; blood culture: X2: no growth 04-10-15: wbc 25.5; hgb 7.8; hct 24.0; mcv 78.9; plt 639; glucose 119; bun 14; creat 0.61; k+ 3.1; na++141; mag 2.0 04-12-15: wbc 16.0; hgb 7.7; hct 24.8; mcv 80.3; plt 688; glucose 107; bun 12; creat 0.63; k+ 3.6; na++139 04-13-15: wbc 13.3; hgb 7.9; hct 25.2; mcv 80.0; plt 704; glucose 110; bun 11; creat 0.57; k+ 3.1; na++134; ast 49; alt 117; alk phos 187; albumin 2.1     Review of Systems  Unable to perform ROS: mental acuity      Physical Exam  Constitutional: No distress.  Eyes: Conjunctivae are normal.  Neck: Neck supple. No JVD present. No thyromegaly present.  Cardiovascular: Normal rate, regular rhythm and intact distal pulses.   Respiratory: Effort normal and breath sounds normal. No respiratory distress. She has no wheezes.  GI: Soft. Bowel sounds are normal. She exhibits no distension. There is no tenderness.  Musculoskeletal: She exhibits no edema.  Able to move all extremities  Has generalized weakness present.   Lymphadenopathy:    She has no cervical adenopathy.  Neurological: She is alert.  Skin: Skin is warm and dry. She is not diaphoretic.  Psychiatric: She has a normal mood and affect.       ASSESSMENT/ PLAN:  1. Pneumonia with sepsis: will complete augementin in 2 days; and will continue to monitor her status; will repeat chest x-ray in 3 weeks.   2.  Lower extremity edema: will continue lasix 40 mg daily will monitor  3. Hypokalemia: will continue k+ 40 meq twice daily will check k+ this week   4. Hypothyroidism: will continue synthroid 75 mcg daily   5. Hypertension: will continue cozaar 100 mg daily   6. GERD: will continue protoxin 40 mg twice daily   7. Progressive supranuclear  palsy: is followed by neurology; will continue to monitor  8. Depression: will continue nortriptyline 25 mg nightly  9.  Chronic back pain: has oxycodone 15 mg every 6 hours as needed for pain.   10. Anemia: her hgb is 7.9 and essentially stable; will continue to monitor her status.    Will check cbc; cmp this week Will repeat chest x-ray in 3 weeks   Time spent  with patient 50  minutes >50% time spent counseling; reviewing medical record; tests; labs; and developing future plan of care    Ok Edwards NP [facility]  Contact [phone] Monday through Friday 8am- 5pm  After hours call [phone]

## 2015-04-15 ENCOUNTER — Non-Acute Institutional Stay (SKILLED_NURSING_FACILITY): Payer: Medicare Other | Admitting: Internal Medicine

## 2015-04-15 DIAGNOSIS — M545 Low back pain: Secondary | ICD-10-CM | POA: Diagnosis not present

## 2015-04-15 DIAGNOSIS — E038 Other specified hypothyroidism: Secondary | ICD-10-CM

## 2015-04-15 DIAGNOSIS — G231 Progressive supranuclear ophthalmoplegia [Steele-Richardson-Olszewski]: Secondary | ICD-10-CM

## 2015-04-15 DIAGNOSIS — E034 Atrophy of thyroid (acquired): Secondary | ICD-10-CM | POA: Diagnosis not present

## 2015-04-15 DIAGNOSIS — F32A Depression, unspecified: Secondary | ICD-10-CM

## 2015-04-15 DIAGNOSIS — I10 Essential (primary) hypertension: Secondary | ICD-10-CM | POA: Diagnosis not present

## 2015-04-15 DIAGNOSIS — J189 Pneumonia, unspecified organism: Secondary | ICD-10-CM | POA: Diagnosis not present

## 2015-04-15 DIAGNOSIS — F329 Major depressive disorder, single episode, unspecified: Secondary | ICD-10-CM

## 2015-04-15 NOTE — Progress Notes (Signed)
Patient ID: Emily Mccullough, female   DOB: March 19, 1946, 69 y.o.   MRN: [id number]    HISTORY AND PHYSICAL   DATE: 04/15/15  Location:  [facility]    Place of Service: SNF [phone])   Extended Emergency Contact Information Primary Emergency Contact: [facility] Address: [address] [name]          [name], [name] [name] of [place] Phone: [phone] Work Phone: [phone] Relation: Spouse Secondary Emergency Contact: [name] Address: [address]          [name], [name] [name] of [name] Phone: [phone] Mobile Phone: [phone] Relation: Brother  Advanced Directive information   FULL CODE  Chief Complaint  Patient presents with  . New Admit To SNF    HPI:  69 yo female seen today as a new admission into [name] following hospital stay for pneumonia. CXR revealed b/l pneumonia and she was tx with azithromycin/rocephin -->vanco and zosyn due to sepsis. Peak WBC 28.9K-->13K at d/c. BC neg and strep pneumonia/legionaella Ag neg. She was d/c on po augmentin. She will be short term rehab due to physical deconditioning with hx PSP  She has no concerns today. She feels weak but is tolerating tx. Appetite reduced. Sleeping well. No falls. No nursing issues  Thyroid - stable on levothyroxine  Depression - stable on pamelor  PSP/LBP - stable; takes prn roxicodone. PSP mx by neurology  GERD - stable on PPI  HTN - BP controlled on lasix, losartan. Kdur for potassium supplement  Hx breast CA - she had a right mastectomy and currently not on any tx  She takes vitamins/mineral supplements  Past Medical History  Diagnosis Date  . PSP (progressive supranuclear palsy) ([place]) 12/09    neuro Dr.Willis, initially dx as parkinson, dx changed to PSP in 2010  . Depression   . GERD (gastroesophageal reflux disease)   . Osteoporosis   . Hyperlipidemia   . Hypothyroidism   . Hypertension   . Chronic back pain   .  Hoarseness   . Colonic polyp   . Postmenopausal status   . SOB (shortness of breath)     s/p a negative Cardio-pulmonary stress test 08-27-04, normal PFT s 2006    . Allergic rhinitis   . Obese     Moderate  . Breast CA ([place]) 1996  . Melanoma ([place]) 2006    in situ nose,s/p mohs surgery  . Headache(784.0)     migraines   . Severe dysarthria 12/23/2014    Past Surgical History  Procedure Laterality Date  . Mastectomy    . Total abdominal hysterectomy w/ bilateral salpingoophorectomy  1990  . Fracture arm    . Spinal fusion  10/24/07    6 screws  . Cataract extraction      right    Patient Care Team: Colon Branch, MD as PCP - General  Social History   Social History  . Marital Status: Married    Spouse Name: N/A  . Number of Children: 0  . Years of Education: N/A   Occupational History  . disable    Social History Main Topics  . Smoking status: Former Research scientist (life sciences)  . Smokeless tobacco: Never Used  . Alcohol Use: No  . Drug Use: No  . Sexual Activity: No   Other Topics Concern  . Not on file   Social History Narrative   Household-- pt ans husband   Patient is right handed   Patient does not  drink caffeine.     reports that she has quit smoking. She has never used smokeless tobacco. She reports that she does not drink alcohol or use illicit drugs.  Family History  Problem Relation Age of Onset  . Coronary artery disease Father     MI at age 25  . Emphysema Father   . Colon cancer Father     age  . Hypertension Father   . Stroke      GF  . Diabetes Mother   . Hypertension Mother   . Breast cancer Maternal Aunt    Family Status  Relation Status Death Age  . Father Deceased   . Mother Deceased     Immunization History  Administered Date(s) Administered  . Influenza Split 03/23/2011  . Influenza Whole 03/09/2007, 03/21/2009, 01/28/2010  . Influenza, High Dose Seasonal PF 03/06/2013  . Influenza,inj,Quad PF,36+ Mos 05/06/2014  . Pneumococcal Conjugate-13  10/10/2013  . Pneumococcal Polysaccharide-23 03/06/2013  . Td 07/25/2003, 05/06/2014    Allergies  Allergen Reactions  . Zostavax [Zoster Vaccine Live]     Unable to take d/t Neosporin allergy  . Neosporin [Neomycin-Bacitracin Zn-Polymyx] Rash    Medications: Patient's Medications  New Prescriptions   No medications on file  Previous Medications   ACETAMINOPHEN (TYLENOL) 500 MG TABLET    Take 500 mg by mouth every 6 (six) hours as needed for moderate pain.   AMBULATORY NON FORMULARY MEDICATION    Incentive Spirometer Dx: G23.1 (PSP)   AMOXICILLIN-CLAVULANATE (AUGMENTIN) 875-125 MG TABLET    Take 1 tablet by mouth every 12 (twelve) hours.   CALCIUM-VITAMIN D PO    Take 1 tablet by mouth daily.    FUROSEMIDE (LASIX) 20 MG TABLET    Take 2 tablets (40 mg total) by mouth daily.   IBUPROFEN (ADVIL,MOTRIN) 200 MG TABLET    Take 600 mg by mouth every 6 (six) hours as needed for moderate pain.    LEVOTHYROXINE (SYNTHROID, LEVOTHROID) 75 MCG TABLET    Take 1 tablet (75 mcg total) by mouth daily before breakfast.   LOSARTAN (COZAAR) 100 MG TABLET    Take 1 tablet (100 mg total) by mouth daily.   MAGNESIUM PO    Take 1 tablet by mouth every evening.   MULTIPLE VITAMIN (MULTIVITAMIN) CAPSULE    Take 1 capsule by mouth daily.     NORTRIPTYLINE (PAMELOR) 25 MG CAPSULE    Take 1 capsule (25 mg total) by mouth at bedtime.   OXYCODONE (ROXICODONE) 15 MG IMMEDIATE RELEASE TABLET    Take 1 tablet (15 mg total) by mouth every 6 (six) hours as needed for pain (Must last 28 days).   OXYCODONE (ROXICODONE) 15 MG IMMEDIATE RELEASE TABLET    Take 1 tablet (15 mg total) by mouth every 6 (six) hours as needed for moderate pain.   PANTOPRAZOLE (PROTONIX) 40 MG TABLET    Take 1 tablet (40 mg total) by mouth 2 (two) times daily.   POTASSIUM CHLORIDE SA (K-DUR,KLOR-CON) 20 MEQ TABLET    Take 2 tablets (40 mEq total) by mouth 2 (two) times daily.  Modified Medications   No medications on file  Discontinued  Medications   No medications on file    Review of Systems  Unable to perform ROS: Psychiatric disorder    Filed Vitals:   04/15/15 1123  BP: 128/65  Pulse: 70  Temp: 98.7 F (37.1 C)  Weight: 163 lb (73.936 kg)  SpO2: 94%   Body mass index is 26.[place]  kg/(m^2).  Physical Exam  Constitutional: She appears well-developed.  Frail appearing sitting on edge of bed in NAD. [place] O2 intact  HENT:  Mouth/Throat: Oropharynx is clear and moist. No oropharyngeal exudate.  Eyes: Pupils are equal, round, and reactive to light. No scleral icterus.  Neck: Neck supple. Carotid bruit is not present. No tracheal deviation present. No thyromegaly present.  Cardiovascular: Normal rate, regular rhythm, normal heart sounds and intact distal pulses.  Exam reveals no gallop and no friction rub.   No murmur heard. No LE edema b/l. no calf TTP.   Pulmonary/Chest: Effort normal. No stridor. No respiratory distress. She has decreased breath sounds (left base). She has no wheezes. She has no rhonchi. She has rales (left base).  Abdominal: Soft. Bowel sounds are normal. She exhibits no distension and no mass. There is no hepatomegaly. There is no tenderness. There is no rebound and no guarding.  Musculoskeletal: She exhibits edema and tenderness.  Lymphadenopathy:    She has no cervical adenopathy.  Neurological: She is alert.  Skin: Skin is warm and dry. No rash noted.  Psychiatric: She has a normal mood and affect. Her behavior is normal. Her speech is slurred.     Labs reviewed: Admission on 04/08/2015, Discharged on 04/13/2015  Component Date Value Ref Range Status  . Sodium 04/08/2015 142  135 - 145 mmol/L Final  . Potassium 04/08/2015 3.6  3.5 - 5.1 mmol/L Final  . Chloride 04/08/2015 107  101 - 111 mmol/L Final  . CO2 04/08/2015 23  22 - 32 mmol/L Final  . Glucose, Bld 04/08/2015 112* 65 - 99 mg/dL Final  . BUN 04/08/2015 16  6 - 20 mg/dL Final  . Creatinine, Ser 04/08/2015 0.59  0.44 - 1.00  mg/dL Final  . Calcium 04/08/2015 8.8* 8.9 - 10.3 mg/dL Final  . Total Protein 04/08/2015 7.2  6.5 - 8.1 g/dL Final  . Albumin 04/08/2015 2.6* 3.5 - 5.0 g/dL Final  . AST 04/08/2015 82* 15 - 41 U/L Final  . ALT 04/08/2015 140* 14 - 54 U/L Final  . Alkaline Phosphatase 04/08/2015 203* 38 - 126 U/L Final  . Total Bilirubin 04/08/2015 0.9  0.3 - 1.2 mg/dL Final  . GFR calc non Af Amer 04/08/2015 >60  >60 mL/min Final  . GFR calc Af Amer 04/08/2015 >60  >60 mL/min Final   Comment: (NOTE) The eGFR has been calculated using the CKD EPI equation. This calculation has not been validated in all clinical situations. eGFR's persistently <60 mL/min signify possible Chronic Kidney Disease.   . Anion gap 04/08/2015 12  5 - 15 Final  . WBC 04/08/2015 28.9* 4.0 - 10.5 K/uL Final  . RBC 04/08/2015 2.90* 3.87 - 5.11 MIL/uL Final  . Hemoglobin 04/08/2015 7.7* 12.0 - 15.0 g/dL Final  . HCT 04/08/2015 23.6* 36.0 - 46.0 % Final  . MCV 04/08/2015 81.4  78.0 - 100.0 fL Final  . MCH 04/08/2015 26.6  26.0 - 34.0 pg Final  . MCHC 04/08/2015 32.6  30.0 - 36.0 g/dL Final  . RDW 04/08/2015 15.6* 11.5 - 15.5 % Final  . Platelets 04/08/2015 621* 150 - 400 K/uL Final  . Specimen Description 04/08/2015 BLOOD LEFT HAND   Final  . Special Requests 04/08/2015 BOTTLES DRAWN AEROBIC AND ANAEROBIC 3 CC EA   Final  . Culture 04/08/2015    Final                   Value:NO GROWTH 5 DAYS Performed at  [facility]   . Report Status 04/08/2015 04/13/2015 FINAL   Final  . Specimen Description 04/08/2015 BLOOD LEFT ANTECUBITAL   Final  . Special Requests 04/08/2015 BOTTLES DRAWN AEROBIC AND ANAEROBIC 5 CC EA   Final  . Culture 04/08/2015    Final                   Value:NO GROWTH 5 DAYS Performed at [facility]   . Report Status 04/08/2015 04/13/2015 FINAL   Final  . HIV Screen 4th Generation wRfx 04/08/2015 Non Reactive  Non Reactive Final   Comment: (NOTE) Performed At: [facility] [place], [place] [id number] Lindon Romp MD EQ:[id number]   . L. pneumophila Serogp 1 Ur Ag 04/11/2015 Negative  Negative Final   Comment: (NOTE) Performed At: [facility] [place], [place] [id number] Lindon Romp MD ER:[id number]   . Source of Sample 04/11/2015 URINE, CLEAN CATCH   Final  . Strep Pneumo Urinary Antigen 04/11/2015 NEGATIVE  NEGATIVE Final   Comment:        Infection due to S. pneumoniae cannot be absolutely ruled out since the antigen present may be below the detection limit of the test. Performed at [facility]   . Sodium 04/09/2015 138  135 - 145 mmol/L Final  . Potassium 04/09/2015 3.2* 3.5 - 5.1 mmol/L Final  . Chloride 04/09/2015 104  101 - 111 mmol/L Final  . CO2 04/09/2015 22  22 - 32 mmol/L Final  . Glucose, Bld 04/09/2015 119* 65 - 99 mg/dL Final  . BUN 04/09/2015 13  6 - 20 mg/dL Final  . Creatinine, Ser 04/09/2015 0.66  0.44 - 1.00 mg/dL Final  . Calcium 04/09/2015 8.4* 8.9 - 10.3 mg/dL Final  . GFR calc non Af Amer 04/09/2015 >60  >60 mL/min Final  . GFR calc Af Amer 04/09/2015 >60  >60 mL/min Final   Comment: (NOTE) The eGFR has been calculated using the CKD EPI equation. This calculation has not been validated in all clinical situations. eGFR's persistently <60 mL/min signify possible Chronic Kidney Disease.   . Anion gap 04/09/2015 12  5 - 15 Final  . WBC 04/09/2015 26.7* 4.0 - 10.5 K/uL Final  . RBC 04/09/2015 3.14* 3.87 - 5.11 MIL/uL Final  . Hemoglobin 04/09/2015 8.2* 12.0 - 15.0 g/dL Final  . HCT 04/09/2015 25.2* 36.0 - 46.0 % Final  . MCV 04/09/2015 80.3  78.0 - 100.0 fL Final  . MCH 04/09/2015 26.1  26.0 - 34.0 pg Final  . MCHC 04/09/2015 32.5  30.0 - 36.0 g/dL Final  . RDW 04/09/2015 15.5  11.5 - 15.5 % Final  . Platelets 04/09/2015 678* 150 - 400 K/uL Final  . Sodium 04/10/2015 141  135 - 145 mmol/L Final  . Potassium 04/10/2015 3.1* 3.5 - 5.1 mmol/L Final  . Chloride 04/10/2015 107  101  - 111 mmol/L Final  . CO2 04/10/2015 23  22 - 32 mmol/L Final  . Glucose, Bld 04/10/2015 119* 65 - 99 mg/dL Final  . BUN 04/10/2015 14  6 - 20 mg/dL Final  . Creatinine, Ser 04/10/2015 0.61  0.44 - 1.00 mg/dL Final  . Calcium 04/10/2015 8.3* 8.9 - 10.3 mg/dL Final  . GFR calc non Af Amer 04/10/2015 >60  >60 mL/min Final  . GFR calc Af Amer 04/10/2015 >60  >60 mL/min Final   Comment: (NOTE) The eGFR has been calculated using the CKD EPI equation. This calculation has  not been validated in all clinical situations. eGFR's persistently <60 mL/min signify possible Chronic Kidney Disease.   . Anion gap 04/10/2015 11  5 - 15 Final  . WBC 04/10/2015 25.5* 4.0 - 10.5 K/uL Final  . RBC 04/10/2015 3.04* 3.87 - 5.11 MIL/uL Final  . Hemoglobin 04/10/2015 7.8* 12.0 - 15.0 g/dL Final  . HCT 04/10/2015 24.0* 36.0 - 46.0 % Final  . MCV 04/10/2015 78.9  78.0 - 100.0 fL Final  . MCH 04/10/2015 25.7* 26.0 - 34.0 pg Final  . MCHC 04/10/2015 32.5  30.0 - 36.0 g/dL Final  . RDW 04/10/2015 15.5  11.5 - 15.5 % Final  . Platelets 04/10/2015 639* 150 - 400 K/uL Final  . Lactic Acid, Venous 04/10/2015 1.0  0.5 - 2.0 mmol/L Final  . Lactic Acid, Venous 04/10/2015 2.8* 0.5 - 2.0 mmol/L Final   Comment: CRITICAL RESULT CALLED TO, READ BACK BY AND VERIFIED WITH: ALI E RN AT 6387 ON 11.24.16 BY MENDOZA B    . Procalcitonin 04/10/2015 0.46   Final   Comment:        Interpretation: PCT (Procalcitonin) <= 0.5 ng/mL: Systemic infection (sepsis) is not likely. Local bacterial infection is possible. (NOTE)         ICU PCT Algorithm               Non ICU PCT Algorithm    ----------------------------     ------------------------------         PCT < 0.25 ng/mL                 PCT < 0.1 ng/mL     Stopping of antibiotics            Stopping of antibiotics       strongly encouraged.               strongly encouraged.    ----------------------------     ------------------------------       PCT level decrease by                PCT < 0.25 ng/mL       >= 80% from peak PCT       OR PCT 0.25 - 0.5 ng/mL          Stopping of antibiotics                                             encouraged.     Stopping of antibiotics           encouraged.    ----------------------------     ------------------------------       PCT level decrease by              PCT >= 0.25 ng/mL       < 80% from peak PCT        AND PCT >= 0.5 ng/mL            Continuin                          g antibiotics  encouraged.       Continuing antibiotics            encouraged.    ----------------------------     ------------------------------     PCT level increase compared          PCT > 0.5 ng/mL         with peak PCT AND          PCT >= 0.5 ng/mL             Escalation of antibiotics                                          strongly encouraged.      Escalation of antibiotics        strongly encouraged.   . Magnesium 04/10/2015 2.0  1.7 - 2.4 mg/dL Final  . Sodium 04/11/2015 141  135 - 145 mmol/L Final  . Potassium 04/11/2015 3.1* 3.5 - 5.1 mmol/L Final  . Chloride 04/11/2015 108  101 - 111 mmol/L Final  . CO2 04/11/2015 22  22 - 32 mmol/L Final  . Glucose, Bld 04/11/2015 124* 65 - 99 mg/dL Final  . BUN 04/11/2015 14  6 - 20 mg/dL Final  . Creatinine, Ser 04/11/2015 0.65  0.44 - 1.00 mg/dL Final  . Calcium 04/11/2015 8.4* 8.9 - 10.3 mg/dL Final  . GFR calc non Af Amer 04/11/2015 >60  >60 mL/min Final  . GFR calc Af Amer 04/11/2015 >60  >60 mL/min Final   Comment: (NOTE) The eGFR has been calculated using the CKD EPI equation. This calculation has not been validated in all clinical situations. eGFR's persistently <60 mL/min signify possible Chronic Kidney Disease.   . Anion gap 04/11/2015 11  5 - 15 Final  . WBC 04/11/2015 23.9* 4.0 - 10.5 K/uL Final  . RBC 04/11/2015 3.21* 3.87 - 5.11 MIL/uL Final  . Hemoglobin 04/11/2015 8.3* 12.0 - 15.0 g/dL Final  . HCT 04/11/2015 25.7* 36.0 - 46.0 %  Final  . MCV 04/11/2015 80.1  78.0 - 100.0 fL Final  . MCH 04/11/2015 25.9* 26.0 - 34.0 pg Final  . MCHC 04/11/2015 32.3  30.0 - 36.0 g/dL Final  . RDW 04/11/2015 15.6* 11.5 - 15.5 % Final  . Platelets 04/11/2015 655* 150 - 400 K/uL Final  . Vancomycin Tr 04/11/2015 12  10.0 - 20.0 ug/mL Final  . O2 Content 04/11/2015 2.0   Final  . Delivery systems 04/11/2015 NASAL CANNULA   Final  . pH, Arterial 04/11/2015 7.489* 7.350 - 7.450 Final  . pCO2 arterial 04/11/2015 28.9* 35.0 - 45.0 mmHg Final  . pO2, Arterial 04/11/2015 71.6* 80.0 - 100.0 mmHg Final  . Bicarbonate 04/11/2015 21.8  20.0 - 24.0 mEq/L Final  . TCO2 04/11/2015 19.8  0 - 100 mmol/L Final  . Acid-base deficit 04/11/2015 0.5  0.0 - 2.0 mmol/L Final  . O2 Saturation 04/11/2015 93.5   Final  . Patient temperature 04/11/2015 98.6   Final  . Collection site 04/11/2015 RIGHT RADIAL   Final  . Drawn by 04/11/2015 [id number]   Final  . Sample type 04/11/2015 ARTERIAL DRAW   Final  . [place] Reading test (pass/fail) 04/11/2015 PASS  PASS Final  . Sodium 04/12/2015 139  135 - 145 mmol/L Final  . Potassium 04/12/2015 3.6  3.5 - 5.1 mmol/L Final  . Chloride 04/12/2015 105  101 - 111 mmol/L Final  .  CO2 04/12/2015 25  22 - 32 mmol/L Final  . Glucose, Bld 04/12/2015 107* 65 - 99 mg/dL Final  . BUN 04/12/2015 12  6 - 20 mg/dL Final  . Creatinine, Ser 04/12/2015 0.63  0.44 - 1.00 mg/dL Final  . Calcium 04/12/2015 8.4* 8.9 - 10.3 mg/dL Final  . GFR calc non Af Amer 04/12/2015 >60  >60 mL/min Final  . GFR calc Af Amer 04/12/2015 >60  >60 mL/min Final   Comment: (NOTE) The eGFR has been calculated using the CKD EPI equation. This calculation has not been validated in all clinical situations. eGFR's persistently <60 mL/min signify possible Chronic Kidney Disease.   . Anion gap 04/12/2015 9  5 - 15 Final  . WBC 04/12/2015 16.0* 4.0 - 10.5 K/uL Final  . RBC 04/12/2015 3.09* 3.87 - 5.11 MIL/uL Final  . Hemoglobin 04/12/2015 7.7* 12.0 - 15.0 g/dL Final    . HCT 04/12/2015 24.8* 36.0 - 46.0 % Final  . MCV 04/12/2015 80.3  78.0 - 100.0 fL Final  . MCH 04/12/2015 24.9* 26.0 - 34.0 pg Final  . MCHC 04/12/2015 31.0  30.0 - 36.0 g/dL Final  . RDW 04/12/2015 15.6* 11.5 - 15.5 % Final  . Platelets 04/12/2015 688* 150 - 400 K/uL Final  . Sodium 04/13/2015 134* 135 - 145 mmol/L Final  . Potassium 04/13/2015 3.1* 3.5 - 5.1 mmol/L Final  . Chloride 04/13/2015 102  101 - 111 mmol/L Final  . CO2 04/13/2015 21* 22 - 32 mmol/L Final  . Glucose, Bld 04/13/2015 110* 65 - 99 mg/dL Final  . BUN 04/13/2015 11  6 - 20 mg/dL Final  . Creatinine, Ser 04/13/2015 0.57  0.44 - 1.00 mg/dL Final  . Calcium 04/13/2015 8.2* 8.9 - 10.3 mg/dL Final  . Total Protein 04/13/2015 6.5  6.5 - 8.1 g/dL Final  . Albumin 04/13/2015 2.1* 3.5 - 5.0 g/dL Final  . AST 04/13/2015 49* 15 - 41 U/L Final  . ALT 04/13/2015 117* 14 - 54 U/L Final  . Alkaline Phosphatase 04/13/2015 187* 38 - 126 U/L Final  . Total Bilirubin 04/13/2015 0.2* 0.3 - 1.2 mg/dL Final  . GFR calc non Af Amer 04/13/2015 >60  >60 mL/min Final  . GFR calc Af Amer 04/13/2015 >60  >60 mL/min Final   Comment: (NOTE) The eGFR has been calculated using the CKD EPI equation. This calculation has not been validated in all clinical situations. eGFR's persistently <60 mL/min signify possible Chronic Kidney Disease.   . Anion gap 04/13/2015 11  5 - 15 Final  . WBC 04/13/2015 13.3* 4.0 - 10.5 K/uL Final  . RBC 04/13/2015 3.15* 3.87 - 5.11 MIL/uL Final  . Hemoglobin 04/13/2015 7.9* 12.0 - 15.0 g/dL Final  . HCT 04/13/2015 25.2* 36.0 - 46.0 % Final  . MCV 04/13/2015 80.0  78.0 - 100.0 fL Final  . MCH 04/13/2015 25.1* 26.0 - 34.0 pg Final  . MCHC 04/13/2015 31.3  30.0 - 36.0 g/dL Final  . RDW 04/13/2015 15.6* 11.5 - 15.5 % Final  . Platelets 04/13/2015 704* 150 - 400 K/uL Final    Dg Chest 2 View  04/08/2015  CLINICAL DATA:  Cough and fever.  Evaluate for pneumonia EXAM: CHEST  2 VIEW COMPARISON:  05/15/2008  FINDINGS: Dense airspace disease at the left base with pleural effusion. No cavitary finding. There is likely also airspace disease at the medial right base. No edema or air leak. Normal non obscured heart size. IMPRESSION: 1. Bibasilar pneumonia, more extensive on the  left where there is small pleural effusion. 2. Followup PA and lateral chest X-ray is recommended in 3-4 weeks following trial of antibiotic therapy to ensure resolution and exclude underlying malignancy. Electronically Signed   By: Monte Fantasia M.D.   On: 04/08/2015 12:29   Dg Chest Port 1 View  04/11/2015  CLINICAL DATA:  Short of breath. EXAM: PORTABLE CHEST 1 VIEW COMPARISON:  04/08/2015 FINDINGS: Progression of left lower lobe infiltrate and left effusion, most likely due to pneumonia. Small area of atelectasis or infiltrate in the right lung base is unchanged. No effusion on the right. Negative for heart failure. IMPRESSION: Progression of left lower lobe infiltrate and left effusion. Probable pneumonia. Electronically Signed   By: Franchot Gallo M.D.   On: 04/11/2015 11:40   Dg Swallowing Func-speech Pathology  04/10/2015  Objective Swallowing Evaluation:   Patient Details Name: BERYL HORNBERGER MRN: [id number] Date of Birth: 23-Apr-1946 Today's Date: 04/10/2015 Time: SLP Start Time (ACUTE ONLY): 1235-SLP Stop Time (ACUTE ONLY): 1310 SLP Time Calculation (min) (ACUTE ONLY): 35 min Past Medical History: Past Medical History Diagnosis Date . PSP (progressive supranuclear palsy) ([place]) 12/09   neuro Dr.Willis, initially dx as parkinson, dx changed to PSP in 2010 . Depression  . GERD (gastroesophageal reflux disease)  . Osteoporosis  . Hyperlipidemia  . Hypothyroidism  . Hypertension  . Chronic back pain  . Hoarseness  . Colonic polyp  . Postmenopausal status  . SOB (shortness of breath)    s/p a negative Cardio-pulmonary stress test 08-27-04, normal PFT s 2006   . Allergic rhinitis  . Obese    Moderate . Breast CA ([place]) 1996 . Melanoma ([place])  2006   in situ nose,s/p mohs surgery . Headache(784.0)    migraines  . Severe dysarthria 12/23/2014 Past Surgical History: Past Surgical History Procedure Laterality Date . Mastectomy   . Total abdominal hysterectomy w/ bilateral salpingoophorectomy  1990 . Fracture arm   . Spinal fusion  10/24/07   6 screws . Cataract extraction     right HPI: Pt is a 69 yo female with h/o of depression, hypertension, hypothyroidism, dysphagia PSP presented as a direct admission from Dr. Larose Kells. Patient presented to his office today after feeling poorly with cough for the past 3 weeks. Patient was reportedly weak and sleepy with a low-grade fever at home. While at her PCPs office today, chest x-ray conducted showing pneumonia. Subjective: pt awake in bed= slp assisted to transport pt to xray Assessment / Plan / Recommendation CHL IP CLINICAL IMPRESSIONS 04/10/2015 Therapy Diagnosis Moderately severe oral and moderate pharyngeal dysphagia Clinical Impression Moderately severe oral and moderate pharyngeal dysphagia consistent with symptoms of dysphagia with PSP.  Delayed oral transiting and lingual pumping with mild oral residuals noted due to lingual weakness. Pharyngeal swallow was moderately weak due to decreased tongue base retraction allowing vallecular more than pyriform sinus residuals with only intermittent sensation.  Pudding residuals precariously lodged on epiglottis without pt awareness.  Liquid consumption and head turn left helpful to decrease residuals.  Pt coughed during testing without barium visualized in larynx to vocal cords/trachea, however suspect aspiration of secretions.  Mild amount of laryngeal penetration noted with thin and nectar due to decreased epiglottic deflection and laryngeal elevation.  Cued cough did marginally decrease penetrates.  Recommend modify diet to maximize airway protection and mitigate aspiration risk.  As pt's cough is grossly weak, this will increase her asp pna risk if she aspirates.   Using live video, educated pt to findings/concerns.  Recommend dys3/thin diet with STRICT precautions.  SlP to follow up to aid in dysphagia.  Pt does admit to recent coughing with liquids x3 weeks.   Impact on safety and function Moderate aspiration risk   CHL IP TREATMENT RECOMMENDATION 04/10/2015 Treatment Recommendations Therapy as outlined in treatment plan below   Prognosis 04/09/2015 Prognosis for Safe Diet Advancement Guarded Barriers to Reach Goals Severity of deficits;Time post onset Barriers/Prognosis Comment -- CHL IP DIET RECOMMENDATION 04/10/2015 SLP Diet Recommendations Dysphagia 3 (Mech soft) solids;Thin liquid Liquid Administration via Cup, straw Medication Administration Crushed with puree Compensations -head turn left, dry swallows, cough Postural Changes -sit upright fully    CHL IP OTHER RECOMMENDATIONS 04/10/2015 Recommended Consults -- Oral Care Recommendations Oral care BID Other Recommendations Clarify dietary restrictions;Have oral suction available   CHL IP FOLLOW UP RECOMMENDATIONS 04/10/2015 Follow up Recommendations Home health SLP   CHL IP FREQUENCY AND DURATION 04/10/2015 Speech Therapy Frequency (ACUTE ONLY) min 2x/week Treatment Duration 2 weeks      CHL IP ORAL PHASE 04/09/2015 Oral Phase Impaired Oral - Pudding Teaspoon -- Oral - Pudding Cup -- Oral - Honey Teaspoon -- Oral - Honey Cup -- Oral - Nectar Teaspoon Weak lingual manipulation;Lingual pumping;Delayed oral transit;Lingual/palatal residue Oral - Nectar Cup Delayed oral transit;Lingual/palatal residue;Lingual pumping;Weak lingual manipulation Oral - Nectar Straw Weak lingual manipulation;Lingual pumping;Lingual/palatal residue;Delayed oral transit Oral - Thin Teaspoon Delayed oral transit;Lingual/palatal residue;Lingual pumping;Weak lingual manipulation Oral - Thin Cup Weak lingual manipulation;Lingual pumping;Delayed oral transit Oral - Thin Straw Delayed oral transit;Lingual/palatal residue;Lingual pumping;Weak lingual  manipulation Oral - Puree Weak lingual manipulation;Delayed oral transit;Lingual pumping Oral - Mech Soft -- Oral - Regular Impaired mastication;Weak lingual manipulation;Lingual pumping Oral - Multi-Consistency -- Oral - Pill Weak lingual manipulation;Lingual pumping Oral Phase - Comment --  CHL IP PHARYNGEAL PHASE 04/09/2015 Pharyngeal Phase Impaired Pharyngeal- Pudding Teaspoon -- Pharyngeal -- Pharyngeal- Pudding Cup -- Pharyngeal -- Pharyngeal- Honey Teaspoon -- Pharyngeal -- Pharyngeal- Honey Cup -- Pharyngeal -- Pharyngeal- Nectar Teaspoon Reduced epiglottic inversion;Reduced pharyngeal peristalsis;Reduced anterior laryngeal mobility;Reduced laryngeal elevation;Reduced airway/laryngeal closure;Reduced tongue base retraction;Penetration/Aspiration during swallow;Pharyngeal residue - valleculae;Pharyngeal residue - pyriform Pharyngeal Material enters airway, remains ABOVE vocal cords and not ejected out Pharyngeal- Nectar Cup Reduced pharyngeal peristalsis;Reduced epiglottic inversion;Reduced anterior laryngeal mobility;Reduced laryngeal elevation;Reduced airway/laryngeal closure;Reduced tongue base retraction;Pharyngeal residue - valleculae;Pharyngeal residue - pyriform Pharyngeal -- Pharyngeal- Nectar Straw Reduced airway/laryngeal closure;Reduced laryngeal elevation;Reduced anterior laryngeal mobility;Reduced epiglottic inversion;Reduced pharyngeal peristalsis;Pharyngeal residue - valleculae;Pharyngeal residue - pyriform Pharyngeal -- Pharyngeal- Thin Teaspoon Reduced tongue base retraction;Reduced airway/laryngeal closure;Reduced laryngeal elevation;Reduced anterior laryngeal mobility;Reduced epiglottic inversion;Reduced pharyngeal peristalsis;Penetration/Aspiration during swallow;Pharyngeal residue - valleculae;Pharyngeal residue - pyriform Pharyngeal Material enters airway, remains ABOVE vocal cords and not ejected out Pharyngeal- Thin Cup Reduced pharyngeal peristalsis;Reduced epiglottic  inversion;Reduced anterior laryngeal mobility;Reduced laryngeal elevation;Reduced airway/laryngeal closure;Reduced tongue base retraction;Pharyngeal residue - valleculae;Pharyngeal residue - pyriform Pharyngeal -- Pharyngeal- Thin Straw Pharyngeal residue - valleculae;Pharyngeal residue - pyriform;Reduced laryngeal elevation;Reduced anterior laryngeal mobility;Reduced epiglottic inversion;Reduced pharyngeal peristalsis Pharyngeal -- Pharyngeal- Puree Pharyngeal residue - valleculae;Reduced laryngeal elevation;Reduced anterior laryngeal mobility;Reduced epiglottic inversion;Reduced pharyngeal peristalsis;Reduced airway/laryngeal closure;Reduced tongue base retraction;Pharyngeal residue - posterior pharnyx Pharyngeal -- Pharyngeal- Mechanical Soft -- Pharyngeal -- Pharyngeal- Regular Pharyngeal residue - valleculae;Reduced tongue base retraction;Reduced airway/laryngeal closure;Reduced laryngeal elevation;Reduced anterior laryngeal mobility;Reduced epiglottic inversion;Reduced pharyngeal peristalsis Pharyngeal -- Pharyngeal- Multi-consistency -- Pharyngeal -- Pharyngeal- Pill Pharyngeal residue - valleculae;Reduced tongue base retraction;Reduced airway/laryngeal closure;Reduced laryngeal elevation;Reduced anterior laryngeal mobility;Reduced epiglottic inversion;Reduced pharyngeal peristalsis Pharyngeal -- Pharyngeal Comment head turn left (to pt's weak  side per her statement) helpful to decrease residuals  CHL IP CERVICAL ESOPHAGEAL PHASE 04/09/2015 Cervical Esophageal Phase WFL, upon esoph sweep x2, esophagus appeared fully clear!  Pudding Teaspoon -- Pudding Cup -- Honey Teaspoon -- Honey Cup -- Nectar Teaspoon -- Nectar Cup -- Nectar Straw -- Thin Teaspoon -- Thin Cup -- Thin Straw -- Puree -- Mechanical Soft -- Regular -- Multi-consistency -- Pill -- Cervical Esophageal Comment -- Luanna Salk, MS [facility] SLP [phone]                Assessment/Plan   ICD-9-CM ICD-10-CM   1. Bilateral pneumonia 486 J18.9   2.  Essential hypertension 401.9 I10   3. PSP (progressive supranuclear palsy) (HCC) 333.0 G23.1   4. Hypothyroidism due to acquired atrophy of thyroid 244.8 E03.8    246.8 E03.4   5. Low back pain without sciatica, unspecified back pain laterality 724.2 M54.5   6. Depression 311 F32.9    Complete abx  Repeat CXR in 3 weeks   Cont [place] O2 as ordered  Cont current meds as ordered  PT/OT as ordered  GOAL: short term rehab and d/c home when medically appropriate. Communicated with pt and nursing.  Will follow  Swade Shonka S. Perlie Gold  [facility] [address] [place], [place] [address] [phone] Cell (Monday-Friday 8 AM - 5 PM) [phone] After 5 PM and follow prompts

## 2015-04-17 ENCOUNTER — Encounter: Payer: Self-pay | Admitting: Internal Medicine

## 2015-04-24 DIAGNOSIS — F329 Major depressive disorder, single episode, unspecified: Secondary | ICD-10-CM | POA: Diagnosis not present

## 2015-05-05 ENCOUNTER — Ambulatory Visit (INDEPENDENT_AMBULATORY_CARE_PROVIDER_SITE_OTHER): Payer: PRIVATE HEALTH INSURANCE | Admitting: Internal Medicine

## 2015-05-05 DIAGNOSIS — R05 Cough: Secondary | ICD-10-CM

## 2015-05-08 ENCOUNTER — Non-Acute Institutional Stay (SKILLED_NURSING_FACILITY): Payer: Medicare Other | Admitting: Adult Health

## 2015-05-08 DIAGNOSIS — E038 Other specified hypothyroidism: Secondary | ICD-10-CM

## 2015-05-08 DIAGNOSIS — I10 Essential (primary) hypertension: Secondary | ICD-10-CM | POA: Diagnosis not present

## 2015-05-08 DIAGNOSIS — K219 Gastro-esophageal reflux disease without esophagitis: Secondary | ICD-10-CM

## 2015-05-08 DIAGNOSIS — E785 Hyperlipidemia, unspecified: Secondary | ICD-10-CM | POA: Diagnosis not present

## 2015-05-08 DIAGNOSIS — G231 Progressive supranuclear ophthalmoplegia [Steele-Richardson-Olszewski]: Secondary | ICD-10-CM | POA: Diagnosis not present

## 2015-05-08 DIAGNOSIS — E034 Atrophy of thyroid (acquired): Secondary | ICD-10-CM | POA: Diagnosis not present

## 2015-05-08 DIAGNOSIS — J189 Pneumonia, unspecified organism: Secondary | ICD-10-CM

## 2015-05-08 DIAGNOSIS — E876 Hypokalemia: Secondary | ICD-10-CM

## 2015-05-17 ENCOUNTER — Encounter: Payer: Self-pay | Admitting: Adult Health

## 2015-05-17 NOTE — Progress Notes (Signed)
Patient ID: Emily Mccullough, female   DOB: 1946/04/24, 69 y.o.   MRN: [id number]    Facility: [facility]      Allergies  Allergen Reactions  . Zostavax [Zoster Vaccine Live]     Unable to take d/t Neosporin allergy  . Neosporin [Neomycin-Bacitracin Zn-Polymyx] Rash    Chief Complaint  Patient presents with  . Medical Management of Chronic Issues    HPI:  She is a resident of this facility being seen for the management of her chronic illnesses. She had a repeat chest x-ray done last night which demonstrates patchy left base infiltrate. She will need abt for this as her wbc on 05-02-15 was 12.7. She does have an occasional cough; but no shortness of breath. There are no reports of fever present.    Past Medical History  Diagnosis Date  . PSP (progressive supranuclear palsy) ([place]) 12/09    neuro Dr.Willis, initially dx as parkinson, dx changed to PSP in 2010  . Depression   . GERD (gastroesophageal reflux disease)   . Osteoporosis   . Hyperlipidemia   . Hypothyroidism   . Hypertension   . Chronic back pain   . Hoarseness   . Colonic polyp   . Postmenopausal status   . SOB (shortness of breath)     s/p a negative Cardio-pulmonary stress test 08-27-04, normal PFT s 2006    . Allergic rhinitis   . Obese     Moderate  . Breast CA ([place]) 1996  . Melanoma ([place]) 2006    in situ nose,s/p mohs surgery  . Headache(784.0)     migraines   . Severe dysarthria 12/23/2014    Past Surgical History  Procedure Laterality Date  . Mastectomy    . Total abdominal hysterectomy w/ bilateral salpingoophorectomy  1990  . Fracture arm    . Spinal fusion  10/24/07    6 screws  . Cataract extraction      right    VITAL SIGNS BP 129/71 mmHg  Pulse 75  Ht '5\' 6"'$  (1.676 m)  Wt 163 lb (73.936 kg)  BMI 26.32 kg/m2  SpO2 98%  Patient's Medications  New Prescriptions   No medications on file  Previous Medications   ACETAMINOPHEN (TYLENOL) 500 MG TABLET    Take 500 mg by mouth every 6  (six) hours as needed for moderate pain.   AMBULATORY NON FORMULARY MEDICATION    Incentive Spirometer Dx: G23.1 (PSP)   AMOXICILLIN-CLAVULANATE (AUGMENTIN) 875-125 MG TABLET    Take 1 tablet by mouth every 12 (twelve) hours.   CALCIUM-VITAMIN D PO    Take 1 tablet by mouth daily.    FUROSEMIDE (LASIX) 20 MG TABLET    Take 2 tablets (40 mg total) by mouth daily.   IBUPROFEN (ADVIL,MOTRIN) 200 MG TABLET    Take 600 mg by mouth every 6 (six) hours as needed for moderate pain.    LEVOTHYROXINE (SYNTHROID, LEVOTHROID) 75 MCG TABLET    Take 1 tablet (75 mcg total) by mouth daily before breakfast.   LOSARTAN (COZAAR) 100 MG TABLET    Take 1 tablet (100 mg total) by mouth daily.   MAGNESIUM PO    Take 1 tablet by mouth every evening.   MULTIPLE VITAMIN (MULTIVITAMIN) CAPSULE    Take 1 capsule by mouth daily.     NORTRIPTYLINE (PAMELOR) 25 MG CAPSULE    Take 1 capsule (25 mg total) by mouth at bedtime.   OXYCODONE (ROXICODONE) 15 MG IMMEDIATE RELEASE TABLET  Take 1 tablet (15 mg total) by mouth every 6 (six) hours as needed for moderate pain.   PANTOPRAZOLE (PROTONIX) 40 MG TABLET    Take 1 tablet (40 mg total) by mouth 2 (two) times daily.   POTASSIUM CHLORIDE SA (K-DUR,KLOR-CON) 20 MEQ TABLET    Take 2 tablets (40 mEq total) by mouth 2 (two) times daily.  Modified Medications   No medications on file  Discontinued Medications   No medications on file     SIGNIFICANT DIAGNOSTIC EXAMS   04-08-15: chest x-ray: 1. Bibasilar pneumonia, more extensive on the left where there is small pleural effusion. 2. Followup PA and lateral chest X-ray is recommended in 3-4 weeks following trial of antibiotic therapy to ensure resolution and exclude underlying malignancy.  04-10-15: swallow study: Dysphagia 3 (Mech soft) solids;Thin liquid   04-11-15: chest x-ray: Progression of left lower lobe infiltrate and left effusion. Probable pneumonia.  05-07-15: chest x-ray: patchy left base infiltrate     LABS  REVIEWED:   04-08-15: wbc 28.9; hgb 7.7; hct 23.6; mcv 81.4; plt 621; glucose 112; bun 16; creat 0.59; k+ 3.6; na++142; ast 83; alt 140; alk phos 203; albumin 2.6; HIV: nr; blood culture: X2: no growth 04-10-15: wbc 25.5; hgb 7.8; hct 24.0; mcv 78.9; plt 639; glucose 119; bun 14; creat 0.61; k+ 3.1; na++141; mag 2.0 04-12-15: wbc 16.0; hgb 7.7; hct 24.8; mcv 80.3; plt 688; glucose 107; bun 12; creat 0.63; k+ 3.6; na++139 04-13-15: wbc 13.3; hgb 7.9; hct 25.2; mcv 80.0; plt 704; glucose 110; bun 11; creat 0.57; k+ 3.1; na++134; ast 49; alt 117; alk phos 187; albumin 2.1  04-16-15: wbc 16.4; hgb 8.8; hct 28.1; mcv 80.5; plt 923; glucose 77; bun 12; creat 0.54; k+ 4.5; na++142; ast 31; alt 77; alk phos 158; albumin 2.8  04-17-15: plt 718 12-2-116: tsh 2.453; vit d 32 05-02-15; wbc 12.7; hgb 10.0; hct 31.3; mcv 78.8; plt 391    Review of Systems  Constitutional: Negative for malaise/fatigue.  HENT: Negative for congestion.   Respiratory: Positive for cough. Negative for sputum production, shortness of breath and wheezing.   Cardiovascular: Negative for chest pain and leg swelling.  Gastrointestinal: Negative for heartburn, abdominal pain and constipation.  Musculoskeletal: Negative for myalgias and joint pain.  Skin: Negative.   Neurological: Negative for headaches.  Psychiatric/Behavioral: The patient is not nervous/anxious.         Physical Exam  Constitutional: No distress.  Eyes: Conjunctivae are normal.  Neck: Neck supple. No JVD present. No thyromegaly present.  Cardiovascular: Normal rate, regular rhythm and intact distal pulses.   Respiratory: Effort normal No respiratory distress. She has no wheezes. diminished left base GI: Soft. Bowel sounds are normal. She exhibits no distension. There is no tenderness.  Musculoskeletal: She exhibits no edema.  Able to move all extremities  Has generalized weakness present.   Lymphadenopathy:    She has no cervical adenopathy.    Neurological: She is alert.  Skin: Skin is warm and dry. She is not diaphoretic.  Psychiatric: She has a normal mood and affect.       ASSESSMENT/ PLAN:  1. Pneumonia with sepsis: will begin her on levaquin 500 mg daily for 10 days with florastor twice daily   2.  Lower extremity edema: will continue lasix 40 mg daily will monitor  3. Hypokalemia: will continue k+ 40 meq twice daily  k+ is 4.5   4. Hypothyroidism: will continue synthroid 75 mcg daily   5. Hypertension:  will continue cozaar 100 mg daily   6. GERD: will continue protoxin 40 mg twice daily   7. Progressive supranuclear palsy: is followed by neurology; will continue to monitor  8. Depression: will continue nortriptyline 25 mg nightly  9.  Chronic back pain: has oxycodone 15 mg every 6 hours as needed for pain.   10. Anemia: her hgb is 10.0 and essentially stable; will continue to monitor her status.           Ok Edwards NP [facility]  Contact [phone] Monday through Friday 8am- 5pm  After hours call [phone]

## 2015-05-20 ENCOUNTER — Encounter: Payer: Self-pay | Admitting: Internal Medicine

## 2015-05-20 ENCOUNTER — Non-Acute Institutional Stay (SKILLED_NURSING_FACILITY): Payer: Medicare Other | Admitting: Internal Medicine

## 2015-05-20 DIAGNOSIS — E034 Atrophy of thyroid (acquired): Secondary | ICD-10-CM

## 2015-05-20 DIAGNOSIS — G231 Progressive supranuclear ophthalmoplegia [Steele-Richardson-Olszewski]: Secondary | ICD-10-CM | POA: Diagnosis not present

## 2015-05-20 DIAGNOSIS — I10 Essential (primary) hypertension: Secondary | ICD-10-CM

## 2015-05-20 DIAGNOSIS — K219 Gastro-esophageal reflux disease without esophagitis: Secondary | ICD-10-CM

## 2015-05-20 DIAGNOSIS — M545 Low back pain: Secondary | ICD-10-CM | POA: Diagnosis not present

## 2015-05-20 DIAGNOSIS — E876 Hypokalemia: Secondary | ICD-10-CM | POA: Diagnosis not present

## 2015-05-20 DIAGNOSIS — R5381 Other malaise: Secondary | ICD-10-CM | POA: Diagnosis not present

## 2015-05-20 DIAGNOSIS — F329 Major depressive disorder, single episode, unspecified: Secondary | ICD-10-CM

## 2015-05-20 DIAGNOSIS — E785 Hyperlipidemia, unspecified: Secondary | ICD-10-CM | POA: Diagnosis not present

## 2015-05-20 DIAGNOSIS — D649 Anemia, unspecified: Secondary | ICD-10-CM

## 2015-05-20 DIAGNOSIS — E038 Other specified hypothyroidism: Secondary | ICD-10-CM | POA: Diagnosis not present

## 2015-05-20 DIAGNOSIS — F32A Depression, unspecified: Secondary | ICD-10-CM

## 2015-05-20 NOTE — Progress Notes (Signed)
Patient ID: Emily Mccullough, female   DOB: 1945/08/01, 70 y.o.   MRN: [id number]    DATE: 05/20/15  Location:  [facility] of Service: SNF [phone])   Extended Emergency Contact Information Primary Emergency Contact: [facility] Address: [address] [place]          [place], [place] [address] [name] of [place] Phone: [phone] Work Phone: [phone] Relation: Spouse Secondary Emergency Contact: [name] Address: [address]          [place], [place] [address] [name] of [place] Phone: [phone] Mobile Phone: [phone] Relation: Brother  Advanced Directive information  FULL CODE  Chief Complaint  Patient presents with  . Discharge Note    HPI:  70 yo female seen today for d/c from SNF following completion of short term rehab for deconditioning. She will be going ome with HH PT/OT/ST/RN. No DME req'd as she has power w/c and 3-in-1 commode at home already. No concerns today. No nursing issues  Lower extremity edema - stable on lasix 40 mg daily  Hypokalemia: - stable on k+ 40 meq twice daily.  k+ is 4.5   Hypothyroidism - stable on synthroid 75 mcg daily   Hypertension- stable on cozaar 100 mg daily   GERD - stable on protoxin 40 mg twice daily   Progressive supranuclear palsy -  followed by neurology  Depression - stable on nortriptyline 25 mg nightly  Chronic back pain - pain stable on oxycodone 15 mg every 6 hours as needed for pain.   hx Anemia - her hgb is 10.0 and essentially stable   Past Medical History  Diagnosis Date  . PSP (progressive supranuclear palsy) ([place]) 12/09    neuro Dr.Willis, initially dx as parkinson, dx changed to PSP in 2010  . Depression   . GERD (gastroesophageal reflux disease)   . Osteoporosis   . Hyperlipidemia   . Hypothyroidism   . Hypertension   . Chronic back pain   . Hoarseness   . Colonic polyp   . Postmenopausal status   . SOB (shortness of breath)     s/p a  negative Cardio-pulmonary stress test 08-27-04, normal PFT s 2006    . Allergic rhinitis   . Obese     Moderate  . Breast CA ([place]) 1996  . Melanoma ([facility]) 2006    in situ nose,s/p mohs surgery  . Headache(784.0)     migraines   . Severe dysarthria 12/23/2014    Past Surgical History  Procedure Laterality Date  . Mastectomy    . Total abdominal hysterectomy w/ bilateral salpingoophorectomy  1990  . Fracture arm    . Spinal fusion  10/24/07    6 screws  . Cataract extraction      right    Patient Care Team: Colon Branch, MD as PCP - General  Social History   Social History  . Marital Status: Married    Spouse Name: N/A  . Number of Children: 0  . Years of Education: N/A   Occupational History  . disable    Social History Main Topics  . Smoking status: Former Research scientist (life sciences)  . Smokeless tobacco: Never Used  . Alcohol Use: No  . Drug Use: No  . Sexual Activity: No   Other Topics Concern  . Not on file   Social History Narrative   Household-- pt ans husband   Patient is right handed   Patient does not drink caffeine.  reports that she has quit smoking. She has never used smokeless tobacco. She reports that she does not drink alcohol or use illicit drugs.  Immunization History  Administered Date(s) Administered  . Influenza Split 03/23/2011  . Influenza Whole 03/09/2007, 03/21/2009, 01/28/2010  . Influenza, High Dose Seasonal PF 03/06/2013  . Influenza,inj,Quad PF,36+ Mos 05/06/2014  . Pneumococcal Conjugate-13 10/10/2013  . Pneumococcal Polysaccharide-23 03/06/2013  . Td 07/25/2003, 05/06/2014    Allergies  Allergen Reactions  . Zostavax [Zoster Vaccine Live]     Unable to take d/t Neosporin allergy  . Neosporin [Neomycin-Bacitracin Zn-Polymyx] Rash    Medications: Patient's Medications  New Prescriptions   No medications on file  Previous Medications   ACETAMINOPHEN (TYLENOL) 500 MG TABLET    Take 500 mg by mouth every 6 (six) hours as needed for moderate  pain.   AMBULATORY NON FORMULARY MEDICATION    Incentive Spirometer Dx: G23.1 (PSP)   AMOXICILLIN-CLAVULANATE (AUGMENTIN) 875-125 MG TABLET    Take 1 tablet by mouth every 12 (twelve) hours.   CALCIUM-VITAMIN D PO    Take 1 tablet by mouth daily.    FUROSEMIDE (LASIX) 20 MG TABLET    Take 2 tablets (40 mg total) by mouth daily.   IBUPROFEN (ADVIL,MOTRIN) 200 MG TABLET    Take 600 mg by mouth every 6 (six) hours as needed for moderate pain.    LEVOTHYROXINE (SYNTHROID, LEVOTHROID) 75 MCG TABLET    Take 1 tablet (75 mcg total) by mouth daily before breakfast.   LOSARTAN (COZAAR) 100 MG TABLET    Take 1 tablet (100 mg total) by mouth daily.   MAGNESIUM PO    Take 1 tablet by mouth every evening.   MULTIPLE VITAMIN (MULTIVITAMIN) CAPSULE    Take 1 capsule by mouth daily.     NORTRIPTYLINE (PAMELOR) 25 MG CAPSULE    Take 1 capsule (25 mg total) by mouth at bedtime.   OXYCODONE (ROXICODONE) 15 MG IMMEDIATE RELEASE TABLET    Take 1 tablet (15 mg total) by mouth every 6 (six) hours as needed for pain (Must last 28 days).   OXYCODONE (ROXICODONE) 15 MG IMMEDIATE RELEASE TABLET    Take 1 tablet (15 mg total) by mouth every 6 (six) hours as needed for moderate pain.   PANTOPRAZOLE (PROTONIX) 40 MG TABLET    Take 1 tablet (40 mg total) by mouth 2 (two) times daily.   POTASSIUM CHLORIDE SA (K-DUR,KLOR-CON) 20 MEQ TABLET    Take 2 tablets (40 mEq total) by mouth 2 (two) times daily.  Modified Medications   No medications on file  Discontinued Medications   No medications on file    Review of Systems  Musculoskeletal: Positive for back pain, arthralgias and gait problem.  Neurological: Positive for weakness.  All other systems reviewed and are negative.   Filed Vitals:   05/20/15 1654  BP: 138/75  Pulse: 90  Temp: 98.5 F (36.9 C)  Weight: 170 lb (77.111 kg)  SpO2: 97%   Body mass index is 27.45 kg/(m^2).  Physical Exam  Constitutional: She appears well-developed.  Frail appearing in NAD    Neurological: She is alert.  Skin: Skin is warm and dry. No rash noted.  Psychiatric: She has a normal mood and affect. Her behavior is normal. Thought content normal.     Labs reviewed: Admission on 04/08/2015, Discharged on 04/13/2015  Component Date Value Ref Range Status  . Sodium 04/08/2015 142  135 - 145 mmol/L Final  . Potassium 04/08/2015 3.6  3.5 - 5.1 mmol/L Final  . Chloride 04/08/2015 107  101 - 111 mmol/L Final  . CO2 04/08/2015 23  22 - 32 mmol/L Final  . Glucose, Bld 04/08/2015 112* 65 - 99 mg/dL Final  . BUN 04/08/2015 16  6 - 20 mg/dL Final  . Creatinine, Ser 04/08/2015 0.59  0.44 - 1.00 mg/dL Final  . Calcium 04/08/2015 8.8* 8.9 - 10.3 mg/dL Final  . Total Protein 04/08/2015 7.2  6.5 - 8.1 g/dL Final  . Albumin 04/08/2015 2.6* 3.5 - 5.0 g/dL Final  . AST 04/08/2015 82* 15 - 41 U/L Final  . ALT 04/08/2015 140* 14 - 54 U/L Final  . Alkaline Phosphatase 04/08/2015 203* 38 - 126 U/L Final  . Total Bilirubin 04/08/2015 0.9  0.3 - 1.2 mg/dL Final  . GFR calc non Af Amer 04/08/2015 >60  >60 mL/min Final  . GFR calc Af Amer 04/08/2015 >60  >60 mL/min Final   Comment: (NOTE) The eGFR has been calculated using the CKD EPI equation. This calculation has not been validated in all clinical situations. eGFR's persistently <60 mL/min signify possible Chronic Kidney Disease.   . Anion gap 04/08/2015 12  5 - 15 Final  . WBC 04/08/2015 28.9* 4.0 - 10.5 K/uL Final  . RBC 04/08/2015 2.90* 3.87 - 5.11 MIL/uL Final  . Hemoglobin 04/08/2015 7.7* 12.0 - 15.0 g/dL Final  . HCT 04/08/2015 23.6* 36.0 - 46.0 % Final  . MCV 04/08/2015 81.4  78.0 - 100.0 fL Final  . MCH 04/08/2015 26.6  26.0 - 34.0 pg Final  . MCHC 04/08/2015 32.6  30.0 - 36.0 g/dL Final  . RDW 04/08/2015 15.6* 11.5 - 15.5 % Final  . Platelets 04/08/2015 621* 150 - 400 K/uL Final  . Specimen Description 04/08/2015 BLOOD LEFT HAND   Final  . Special Requests 04/08/2015 BOTTLES DRAWN AEROBIC AND ANAEROBIC 3 CC EA    Final  . Culture 04/08/2015    Final                   Value:NO GROWTH 5 DAYS Performed at [facility]   . Report Status 04/08/2015 04/13/2015 FINAL   Final  . Specimen Description 04/08/2015 BLOOD LEFT ANTECUBITAL   Final  . Special Requests 04/08/2015 BOTTLES DRAWN AEROBIC AND ANAEROBIC 5 CC EA   Final  . Culture 04/08/2015    Final                   Value:NO GROWTH 5 DAYS Performed at [facility]   . Report Status 04/08/2015 04/13/2015 FINAL   Final  . HIV Screen 4th Generation wRfx 04/08/2015 Non Reactive  Non Reactive Final   Comment: (NOTE) Performed At: [facility] [place], [place] [id number] Lindon Romp MD LK:[id number]   . L. pneumophila Serogp 1 Ur Ag 04/11/2015 Negative  Negative Final   Comment: (NOTE) Performed At: [facility] [place], [place] [id number] Lindon Romp MD QV:[id number]   . Source of Sample 04/11/2015 URINE, CLEAN CATCH   Final  . Strep Pneumo Urinary Antigen 04/11/2015 NEGATIVE  NEGATIVE Final   Comment:        Infection due to S. pneumoniae cannot be absolutely ruled out since the antigen present may be below the detection limit of the test. Performed at [facility]   . Sodium 04/09/2015 138  135 - 145 mmol/L Final  . Potassium 04/09/2015 3.2* 3.5 - 5.1 mmol/L Final  .  Chloride 04/09/2015 104  101 - 111 mmol/L Final  . CO2 04/09/2015 22  22 - 32 mmol/L Final  . Glucose, Bld 04/09/2015 119* 65 - 99 mg/dL Final  . BUN 04/09/2015 13  6 - 20 mg/dL Final  . Creatinine, Ser 04/09/2015 0.66  0.44 - 1.00 mg/dL Final  . Calcium 04/09/2015 8.4* 8.9 - 10.3 mg/dL Final  . GFR calc non Af Amer 04/09/2015 >60  >60 mL/min Final  . GFR calc Af Amer 04/09/2015 >60  >60 mL/min Final   Comment: (NOTE) The eGFR has been calculated using the CKD EPI equation. This calculation has not been validated in all clinical situations. eGFR's persistently <60 mL/min signify possible Chronic  Kidney Disease.   . Anion gap 04/09/2015 12  5 - 15 Final  . WBC 04/09/2015 26.7* 4.0 - 10.5 K/uL Final  . RBC 04/09/2015 3.14* 3.87 - 5.11 MIL/uL Final  . Hemoglobin 04/09/2015 8.2* 12.0 - 15.0 g/dL Final  . HCT 04/09/2015 25.2* 36.0 - 46.0 % Final  . MCV 04/09/2015 80.3  78.0 - 100.0 fL Final  . MCH 04/09/2015 26.1  26.0 - 34.0 pg Final  . MCHC 04/09/2015 32.5  30.0 - 36.0 g/dL Final  . RDW 04/09/2015 15.5  11.5 - 15.5 % Final  . Platelets 04/09/2015 678* 150 - 400 K/uL Final  . Sodium 04/10/2015 141  135 - 145 mmol/L Final  . Potassium 04/10/2015 3.1* 3.5 - 5.1 mmol/L Final  . Chloride 04/10/2015 107  101 - 111 mmol/L Final  . CO2 04/10/2015 23  22 - 32 mmol/L Final  . Glucose, Bld 04/10/2015 119* 65 - 99 mg/dL Final  . BUN 04/10/2015 14  6 - 20 mg/dL Final  . Creatinine, Ser 04/10/2015 0.61  0.44 - 1.00 mg/dL Final  . Calcium 04/10/2015 8.3* 8.9 - 10.3 mg/dL Final  . GFR calc non Af Amer 04/10/2015 >60  >60 mL/min Final  . GFR calc Af Amer 04/10/2015 >60  >60 mL/min Final   Comment: (NOTE) The eGFR has been calculated using the CKD EPI equation. This calculation has not been validated in all clinical situations. eGFR's persistently <60 mL/min signify possible Chronic Kidney Disease.   . Anion gap 04/10/2015 11  5 - 15 Final  . WBC 04/10/2015 25.5* 4.0 - 10.5 K/uL Final  . RBC 04/10/2015 3.04* 3.87 - 5.11 MIL/uL Final  . Hemoglobin 04/10/2015 7.8* 12.0 - 15.0 g/dL Final  . HCT 04/10/2015 24.0* 36.0 - 46.0 % Final  . MCV 04/10/2015 78.9  78.0 - 100.0 fL Final  . MCH 04/10/2015 25.7* 26.0 - 34.0 pg Final  . MCHC 04/10/2015 32.5  30.0 - 36.0 g/dL Final  . RDW 04/10/2015 15.5  11.5 - 15.5 % Final  . Platelets 04/10/2015 639* 150 - 400 K/uL Final  . Lactic Acid, Venous 04/10/2015 1.0  0.5 - 2.0 mmol/L Final  . Lactic Acid, Venous 04/10/2015 2.8* 0.5 - 2.0 mmol/L Final   Comment: CRITICAL RESULT CALLED TO, READ BACK BY AND VERIFIED WITH: ALI E RN AT 5462 ON 11.24.16 BY MENDOZA  B    . Procalcitonin 04/10/2015 0.46   Final   Comment:        Interpretation: PCT (Procalcitonin) <= 0.5 ng/mL: Systemic infection (sepsis) is not likely. Local bacterial infection is possible. (NOTE)         ICU PCT Algorithm               Non ICU PCT Algorithm    ----------------------------     ------------------------------  PCT < 0.25 ng/mL                 PCT < 0.1 ng/mL     Stopping of antibiotics            Stopping of antibiotics       strongly encouraged.               strongly encouraged.    ----------------------------     ------------------------------       PCT level decrease by               PCT < 0.25 ng/mL       >= 80% from peak PCT       OR PCT 0.25 - 0.5 ng/mL          Stopping of antibiotics                                             encouraged.     Stopping of antibiotics           encouraged.    ----------------------------     ------------------------------       PCT level decrease by              PCT >= 0.25 ng/mL       < 80% from peak PCT        AND PCT >= 0.5 ng/mL            Continuin                          g antibiotics                                              encouraged.       Continuing antibiotics            encouraged.    ----------------------------     ------------------------------     PCT level increase compared          PCT > 0.5 ng/mL         with peak PCT AND          PCT >= 0.5 ng/mL             Escalation of antibiotics                                          strongly encouraged.      Escalation of antibiotics        strongly encouraged.   . Magnesium 04/10/2015 2.0  1.7 - 2.4 mg/dL Final  . Sodium 04/11/2015 141  135 - 145 mmol/L Final  . Potassium 04/11/2015 3.1* 3.5 - 5.1 mmol/L Final  . Chloride 04/11/2015 108  101 - 111 mmol/L Final  . CO2 04/11/2015 22  22 - 32 mmol/L Final  . Glucose, Bld 04/11/2015 124* 65 - 99 mg/dL Final  . BUN 04/11/2015 14  6 - 20 mg/dL Final  . Creatinine, Ser 04/11/2015 0.65  0.44 -  1.00 mg/dL Final  . Calcium 04/11/2015 8.4* 8.9 - 10.3 mg/dL Final  .  GFR calc non Af Amer 04/11/2015 >60  >60 mL/min Final  . GFR calc Af Amer 04/11/2015 >60  >60 mL/min Final   Comment: (NOTE) The eGFR has been calculated using the CKD EPI equation. This calculation has not been validated in all clinical situations. eGFR's persistently <60 mL/min signify possible Chronic Kidney Disease.   . Anion gap 04/11/2015 11  5 - 15 Final  . WBC 04/11/2015 23.9* 4.0 - 10.5 K/uL Final  . RBC 04/11/2015 3.21* 3.87 - 5.11 MIL/uL Final  . Hemoglobin 04/11/2015 8.3* 12.0 - 15.0 g/dL Final  . HCT 04/11/2015 25.7* 36.0 - 46.0 % Final  . MCV 04/11/2015 80.1  78.0 - 100.0 fL Final  . MCH 04/11/2015 25.9* 26.0 - 34.0 pg Final  . MCHC 04/11/2015 32.3  30.0 - 36.0 g/dL Final  . RDW 04/11/2015 15.6* 11.5 - 15.5 % Final  . Platelets 04/11/2015 655* 150 - 400 K/uL Final  . Vancomycin Tr 04/11/2015 12  10.0 - 20.0 ug/mL Final  . O2 Content 04/11/2015 2.0   Final  . Delivery systems 04/11/2015 NASAL CANNULA   Final  . pH, Arterial 04/11/2015 7.489* 7.350 - 7.450 Final  . pCO2 arterial 04/11/2015 28.9* 35.0 - 45.0 mmHg Final  . pO2, Arterial 04/11/2015 71.6* 80.0 - 100.0 mmHg Final  . Bicarbonate 04/11/2015 21.8  20.0 - 24.0 mEq/L Final  . TCO2 04/11/2015 19.8  0 - 100 mmol/L Final  . Acid-base deficit 04/11/2015 0.5  0.0 - 2.0 mmol/L Final  . O2 Saturation 04/11/2015 93.5   Final  . Patient temperature 04/11/2015 98.6   Final  . Collection site 04/11/2015 RIGHT RADIAL   Final  . Drawn by 04/11/2015 [id number]   Final  . Sample type 04/11/2015 ARTERIAL DRAW   Final  . Cha[place]y Reading test (pass/fail) 04/11/2015 PASS  PASS Final  . Sodium 04/12/2015 139  135 - 145 mmol/L Final  . Potassium 04/12/2015 3.6  3.5 - 5.1 mmol/L Final  . Chloride 04/12/2015 105  101 - 111 mmol/L Final  . CO2 04/12/2015 25  22 - 32 mmol/L Final  . Glucose, Bld 04/12/2015 107* 65 - 99 mg/dL Final  . BUN 04/12/2015 12  6 - 20 mg/dL Final  .  Creatinine, Ser 04/12/2015 0.63  0.44 - 1.00 mg/dL Final  . Calcium 04/12/2015 8.4* 8.9 - 10.3 mg/dL Final  . GFR calc non Af Amer 04/12/2015 >60  >60 mL/min Final  . GFR calc Af Amer 04/12/2015 >60  >60 mL/min Final   Comment: (NOTE) The eGFR has been calculated using the CKD EPI equation. This calculation has not been validated in all clinical situations. eGFR's persistently <60 mL/min signify possible Chronic Kidney Disease.   . Anion gap 04/12/2015 9  5 - 15 Final  . WBC 04/12/2015 16.0* 4.0 - 10.5 K/uL Final  . RBC 04/12/2015 3.09* 3.87 - 5.11 MIL/uL Final  . Hemoglobin 04/12/2015 7.7* 12.0 - 15.0 g/dL Final  . HCT 04/12/2015 24.8* 36.0 - 46.0 % Final  . MCV 04/12/2015 80.3  78.0 - 100.0 fL Final  . MCH 04/12/2015 24.9* 26.0 - 34.0 pg Final  . MCHC 04/12/2015 31.0  30.0 - 36.0 g/dL Final  . RDW 04/12/2015 15.6* 11.5 - 15.5 % Final  . Platelets 04/12/2015 688* 150 - 400 K/uL Final  . Sodium 04/13/2015 134* 135 - 145 mmol/L Final  . Potassium 04/13/2015 3.1* 3.5 - 5.1 mmol/L Final  . Chloride 04/13/2015 102  101 - 111 mmol/L Final  . CO2 04/13/2015  21* 22 - 32 mmol/L Final  . Glucose, Bld 04/13/2015 110* 65 - 99 mg/dL Final  . BUN 04/13/2015 11  6 - 20 mg/dL Final  . Creatinine, Ser 04/13/2015 0.57  0.44 - 1.00 mg/dL Final  . Calcium 04/13/2015 8.2* 8.9 - 10.3 mg/dL Final  . Total Protein 04/13/2015 6.5  6.5 - 8.1 g/dL Final  . Albumin 04/13/2015 2.1* 3.5 - 5.0 g/dL Final  . AST 04/13/2015 49* 15 - 41 U/L Final  . ALT 04/13/2015 117* 14 - 54 U/L Final  . Alkaline Phosphatase 04/13/2015 187* 38 - 126 U/L Final  . Total Bilirubin 04/13/2015 0.2* 0.3 - 1.2 mg/dL Final  . GFR calc non Af Amer 04/13/2015 >60  >60 mL/min Final  . GFR calc Af Amer 04/13/2015 >60  >60 mL/min Final   Comment: (NOTE) The eGFR has been calculated using the CKD EPI equation. This calculation has not been validated in all clinical situations. eGFR's persistently <60 mL/min signify possible Chronic  Kidney Disease.   . Anion gap 04/13/2015 11  5 - 15 Final  . WBC 04/13/2015 13.3* 4.0 - 10.5 K/uL Final  . RBC 04/13/2015 3.15* 3.87 - 5.11 MIL/uL Final  . Hemoglobin 04/13/2015 7.9* 12.0 - 15.0 g/dL Final  . HCT 04/13/2015 25.2* 36.0 - 46.0 % Final  . MCV 04/13/2015 80.0  78.0 - 100.0 fL Final  . MCH 04/13/2015 25.1* 26.0 - 34.0 pg Final  . MCHC 04/13/2015 31.3  30.0 - 36.0 g/dL Final  . RDW 04/13/2015 15.6* 11.5 - 15.5 % Final  . Platelets 04/13/2015 704* 150 - 400 K/uL Final    No results found.   Assessment/Plan   ICD-9-CM ICD-10-CM   1. Physical deconditioning 799.3 R53.81   2. PSP (progressive supranuclear palsy) ([facility]) 333.0 G23.1   3. Essential hypertension 401.9 I10   4. Hypothyroidism due to acquired atrophy of thyroid 244.8 E03.8    246.8 E03.4   5. Hypokalemia 276.8 E87.6   6. Gastroesophageal reflux disease, esophagitis presence not specified 530.81 K21.9   7. Hyperlipidemia 272.4 E78.5   8. Low back pain without sciatica, unspecified back pain laterality 724.2 M54.5   9. Depression 311 F32.9   10. Anemia, unspecified anemia type 285.9 D64.9     Patient is being discharged with home health services:  PT/OT/ST/Nursing  Patient is being discharged with the following durable medical equipment:  None (has power w/c and 3-in-1 at home)  Patient has been advised to f/u with their PCP in 1-2 weeks to bring them up to date on their rehab stay.  They were provided with a 30 day supply of scripts for prescription medications and refills must be obtained from their PCP.  TIME SPENT (MINUTES): Ollie. Perlie Gold  [facility] [address] [place], [place] [address] [phone] Cell (Monday-Friday 8 AM - 5 PM) [phone] After 5 PM and follow prompts

## 2015-05-23 DIAGNOSIS — R1312 Dysphagia, oropharyngeal phase: Secondary | ICD-10-CM | POA: Diagnosis not present

## 2015-05-23 DIAGNOSIS — E039 Hypothyroidism, unspecified: Secondary | ICD-10-CM | POA: Diagnosis not present

## 2015-05-23 DIAGNOSIS — Z8701 Personal history of pneumonia (recurrent): Secondary | ICD-10-CM | POA: Diagnosis not present

## 2015-05-23 DIAGNOSIS — M810 Age-related osteoporosis without current pathological fracture: Secondary | ICD-10-CM | POA: Diagnosis not present

## 2015-05-23 DIAGNOSIS — R471 Dysarthria and anarthria: Secondary | ICD-10-CM | POA: Diagnosis not present

## 2015-05-23 DIAGNOSIS — Z853 Personal history of malignant neoplasm of breast: Secondary | ICD-10-CM | POA: Diagnosis not present

## 2015-05-23 DIAGNOSIS — F329 Major depressive disorder, single episode, unspecified: Secondary | ICD-10-CM | POA: Diagnosis not present

## 2015-05-23 DIAGNOSIS — E669 Obesity, unspecified: Secondary | ICD-10-CM | POA: Diagnosis not present

## 2015-05-23 DIAGNOSIS — G231 Progressive supranuclear ophthalmoplegia [Steele-Richardson-Olszewski]: Secondary | ICD-10-CM | POA: Diagnosis not present

## 2015-05-23 DIAGNOSIS — E785 Hyperlipidemia, unspecified: Secondary | ICD-10-CM | POA: Diagnosis not present

## 2015-05-23 DIAGNOSIS — K219 Gastro-esophageal reflux disease without esophagitis: Secondary | ICD-10-CM | POA: Diagnosis not present

## 2015-05-26 DIAGNOSIS — G231 Progressive supranuclear ophthalmoplegia [Steele-Richardson-Olszewski]: Secondary | ICD-10-CM | POA: Diagnosis not present

## 2015-05-26 DIAGNOSIS — F329 Major depressive disorder, single episode, unspecified: Secondary | ICD-10-CM | POA: Diagnosis not present

## 2015-05-26 DIAGNOSIS — K219 Gastro-esophageal reflux disease without esophagitis: Secondary | ICD-10-CM | POA: Diagnosis not present

## 2015-05-26 DIAGNOSIS — M810 Age-related osteoporosis without current pathological fracture: Secondary | ICD-10-CM | POA: Diagnosis not present

## 2015-05-26 DIAGNOSIS — R1312 Dysphagia, oropharyngeal phase: Secondary | ICD-10-CM | POA: Diagnosis not present

## 2015-05-26 DIAGNOSIS — R471 Dysarthria and anarthria: Secondary | ICD-10-CM | POA: Diagnosis not present

## 2015-05-27 DIAGNOSIS — F329 Major depressive disorder, single episode, unspecified: Secondary | ICD-10-CM | POA: Diagnosis not present

## 2015-05-27 DIAGNOSIS — G231 Progressive supranuclear ophthalmoplegia [Steele-Richardson-Olszewski]: Secondary | ICD-10-CM | POA: Diagnosis not present

## 2015-05-27 DIAGNOSIS — M810 Age-related osteoporosis without current pathological fracture: Secondary | ICD-10-CM | POA: Diagnosis not present

## 2015-05-27 DIAGNOSIS — R471 Dysarthria and anarthria: Secondary | ICD-10-CM | POA: Diagnosis not present

## 2015-05-27 DIAGNOSIS — R1312 Dysphagia, oropharyngeal phase: Secondary | ICD-10-CM | POA: Diagnosis not present

## 2015-05-27 DIAGNOSIS — K219 Gastro-esophageal reflux disease without esophagitis: Secondary | ICD-10-CM | POA: Diagnosis not present

## 2015-05-28 DIAGNOSIS — G231 Progressive supranuclear ophthalmoplegia [Steele-Richardson-Olszewski]: Secondary | ICD-10-CM | POA: Diagnosis not present

## 2015-05-28 DIAGNOSIS — M810 Age-related osteoporosis without current pathological fracture: Secondary | ICD-10-CM | POA: Diagnosis not present

## 2015-05-28 DIAGNOSIS — F329 Major depressive disorder, single episode, unspecified: Secondary | ICD-10-CM | POA: Diagnosis not present

## 2015-05-28 DIAGNOSIS — K219 Gastro-esophageal reflux disease without esophagitis: Secondary | ICD-10-CM | POA: Diagnosis not present

## 2015-05-28 DIAGNOSIS — R471 Dysarthria and anarthria: Secondary | ICD-10-CM | POA: Diagnosis not present

## 2015-05-28 DIAGNOSIS — R1312 Dysphagia, oropharyngeal phase: Secondary | ICD-10-CM | POA: Diagnosis not present

## 2015-05-30 DIAGNOSIS — F329 Major depressive disorder, single episode, unspecified: Secondary | ICD-10-CM | POA: Diagnosis not present

## 2015-05-30 DIAGNOSIS — G231 Progressive supranuclear ophthalmoplegia [Steele-Richardson-Olszewski]: Secondary | ICD-10-CM | POA: Diagnosis not present

## 2015-05-30 DIAGNOSIS — K219 Gastro-esophageal reflux disease without esophagitis: Secondary | ICD-10-CM | POA: Diagnosis not present

## 2015-05-30 DIAGNOSIS — R471 Dysarthria and anarthria: Secondary | ICD-10-CM | POA: Diagnosis not present

## 2015-05-30 DIAGNOSIS — R1312 Dysphagia, oropharyngeal phase: Secondary | ICD-10-CM | POA: Diagnosis not present

## 2015-05-30 DIAGNOSIS — M810 Age-related osteoporosis without current pathological fracture: Secondary | ICD-10-CM | POA: Diagnosis not present

## 2015-06-02 DIAGNOSIS — F329 Major depressive disorder, single episode, unspecified: Secondary | ICD-10-CM | POA: Diagnosis not present

## 2015-06-02 DIAGNOSIS — K219 Gastro-esophageal reflux disease without esophagitis: Secondary | ICD-10-CM | POA: Diagnosis not present

## 2015-06-02 DIAGNOSIS — M810 Age-related osteoporosis without current pathological fracture: Secondary | ICD-10-CM | POA: Diagnosis not present

## 2015-06-02 DIAGNOSIS — R1312 Dysphagia, oropharyngeal phase: Secondary | ICD-10-CM | POA: Diagnosis not present

## 2015-06-02 DIAGNOSIS — R471 Dysarthria and anarthria: Secondary | ICD-10-CM | POA: Diagnosis not present

## 2015-06-02 DIAGNOSIS — G231 Progressive supranuclear ophthalmoplegia [Steele-Richardson-Olszewski]: Secondary | ICD-10-CM | POA: Diagnosis not present

## 2015-06-04 ENCOUNTER — Telehealth: Payer: Self-pay | Admitting: Internal Medicine

## 2015-06-04 DIAGNOSIS — R471 Dysarthria and anarthria: Secondary | ICD-10-CM | POA: Diagnosis not present

## 2015-06-04 DIAGNOSIS — G231 Progressive supranuclear ophthalmoplegia [Steele-Richardson-Olszewski]: Secondary | ICD-10-CM | POA: Diagnosis not present

## 2015-06-04 DIAGNOSIS — M810 Age-related osteoporosis without current pathological fracture: Secondary | ICD-10-CM | POA: Diagnosis not present

## 2015-06-04 DIAGNOSIS — R1312 Dysphagia, oropharyngeal phase: Secondary | ICD-10-CM | POA: Diagnosis not present

## 2015-06-04 DIAGNOSIS — F329 Major depressive disorder, single episode, unspecified: Secondary | ICD-10-CM | POA: Diagnosis not present

## 2015-06-04 DIAGNOSIS — K219 Gastro-esophageal reflux disease without esophagitis: Secondary | ICD-10-CM | POA: Diagnosis not present

## 2015-06-04 NOTE — Telephone Encounter (Signed)
Caller name: Glennon Mac   Relationship to patient: Spouse  Can be reached: [phone]  Reason for call: Pt's spouse called in to check the status of a [facility] sent [facility]. He says that they are requesting a bed for his wife and need the provider to sign off on paperwork.    Thanks.

## 2015-06-05 DIAGNOSIS — R1312 Dysphagia, oropharyngeal phase: Secondary | ICD-10-CM | POA: Diagnosis not present

## 2015-06-05 DIAGNOSIS — K219 Gastro-esophageal reflux disease without esophagitis: Secondary | ICD-10-CM | POA: Diagnosis not present

## 2015-06-05 DIAGNOSIS — G231 Progressive supranuclear ophthalmoplegia [Steele-Richardson-Olszewski]: Secondary | ICD-10-CM | POA: Diagnosis not present

## 2015-06-05 DIAGNOSIS — M810 Age-related osteoporosis without current pathological fracture: Secondary | ICD-10-CM | POA: Diagnosis not present

## 2015-06-05 DIAGNOSIS — R471 Dysarthria and anarthria: Secondary | ICD-10-CM | POA: Diagnosis not present

## 2015-06-05 DIAGNOSIS — F329 Major depressive disorder, single episode, unspecified: Secondary | ICD-10-CM | POA: Diagnosis not present

## 2015-06-05 NOTE — Telephone Encounter (Addendum)
Received form for Hospital bed, notes must include the difficulty w/ repositioning Pt's body, and/or requirements of the head to be elevated 30 degrees most of time due to COPD, CHF. Notes will also need to explain need for frequent and immediate need for change in body position. Also, gel overlay would need documentation in notes addressing limited mobility or staged pressure ulcers on trunk or pelvis. Reviewed last OV note, nothing in note would be considered detailed to this extent. Pt has hospital/nurse house F/U scheduled for 06/24/2015. Will hold onto form for appt.

## 2015-06-05 NOTE — Telephone Encounter (Signed)
LM informing of below and to call if earlier appt required

## 2015-06-05 NOTE — Telephone Encounter (Signed)
Spoke with Caryl Pina at [facility], informed her that I have not received fax regarding hospital bed. She informed me that she spoke w/ someone on 06/02/2015 (she was unable to provide name) and that we had received the form and it was given to Dr. Larose Kells; informed her that I do not believe we have received it, no note in chart about receiving. Asked her to re-fax to [phone].

## 2015-06-05 NOTE — Telephone Encounter (Signed)
Caryl Pina w/ [facility] is calling as well in regards to order for bed. Please call her at [phone] ext [phone]

## 2015-06-05 NOTE — Telephone Encounter (Signed)
Drue Dun, I have received form however, specific documentation will be needed on Pt's OV note for Pt to be considered for a hospital bed. Pt has 30 minute appt scheduled for 06/24/2015, please inform Pt's husband, Glennon Mac, that we can have this completed at that time or if they feel like she can't wait she will need a sooner appt. Thank you.

## 2015-06-05 NOTE — Telephone Encounter (Signed)
Have not seen form from [place], will look out for form.

## 2015-06-05 NOTE — Telephone Encounter (Signed)
Emily Mccullough,   Mr Paulino called and asked if our office has called [place]. I advised him I was not sure but that you documented the form mentioned below has not be received as of now. I added Shyrel and Janett Billow to look out for the form as well.

## 2015-06-05 NOTE — Telephone Encounter (Signed)
Pt husband states they need to be seen next week Tues - Thurs. Please advise a good time that pt can be worked in as schedule currently has no 30 min availability.

## 2015-06-06 DIAGNOSIS — R1312 Dysphagia, oropharyngeal phase: Secondary | ICD-10-CM | POA: Diagnosis not present

## 2015-06-06 DIAGNOSIS — F329 Major depressive disorder, single episode, unspecified: Secondary | ICD-10-CM | POA: Diagnosis not present

## 2015-06-06 DIAGNOSIS — M810 Age-related osteoporosis without current pathological fracture: Secondary | ICD-10-CM | POA: Diagnosis not present

## 2015-06-06 DIAGNOSIS — G231 Progressive supranuclear ophthalmoplegia [Steele-Richardson-Olszewski]: Secondary | ICD-10-CM | POA: Diagnosis not present

## 2015-06-06 DIAGNOSIS — R471 Dysarthria and anarthria: Secondary | ICD-10-CM | POA: Diagnosis not present

## 2015-06-06 DIAGNOSIS — K219 Gastro-esophageal reflux disease without esophagitis: Secondary | ICD-10-CM | POA: Diagnosis not present

## 2015-06-06 NOTE — Telephone Encounter (Signed)
LM to notify pt appt 06/10/15 11:30am

## 2015-06-06 NOTE — Telephone Encounter (Signed)
The only time available is Tuesday, June 10, 2015 at 11:30.

## 2015-06-06 NOTE — Telephone Encounter (Signed)
Talked with husband and they will be here 1/24 11:30am.

## 2015-06-09 ENCOUNTER — Telehealth: Payer: Self-pay | Admitting: Neurology

## 2015-06-09 ENCOUNTER — Ambulatory Visit: Payer: PRIVATE HEALTH INSURANCE | Admitting: Internal Medicine

## 2015-06-09 DIAGNOSIS — M810 Age-related osteoporosis without current pathological fracture: Secondary | ICD-10-CM | POA: Diagnosis not present

## 2015-06-09 DIAGNOSIS — R1312 Dysphagia, oropharyngeal phase: Secondary | ICD-10-CM | POA: Diagnosis not present

## 2015-06-09 DIAGNOSIS — F329 Major depressive disorder, single episode, unspecified: Secondary | ICD-10-CM | POA: Diagnosis not present

## 2015-06-09 DIAGNOSIS — G231 Progressive supranuclear ophthalmoplegia [Steele-Richardson-Olszewski]: Secondary | ICD-10-CM | POA: Diagnosis not present

## 2015-06-09 DIAGNOSIS — R471 Dysarthria and anarthria: Secondary | ICD-10-CM | POA: Diagnosis not present

## 2015-06-09 DIAGNOSIS — K219 Gastro-esophageal reflux disease without esophagitis: Secondary | ICD-10-CM | POA: Diagnosis not present

## 2015-06-09 MED ORDER — OXYCODONE HCL 15 MG PO TABS: 15.0000 mg | ORAL_TABLET | Freq: Four times a day (QID) | ORAL | Status: DC | PRN

## 2015-06-09 NOTE — Telephone Encounter (Signed)
I will write a prescription for the oxycodone.

## 2015-06-09 NOTE — Telephone Encounter (Signed)
Husband Glennon Mac called to request refill of oxyCODONE (ROXICODONE) 15 MG immediate release tablet

## 2015-06-10 ENCOUNTER — Encounter: Payer: Self-pay | Admitting: Internal Medicine

## 2015-06-10 ENCOUNTER — Ambulatory Visit (HOSPITAL_BASED_OUTPATIENT_CLINIC_OR_DEPARTMENT_OTHER)
Admission: RE | Admit: 2015-06-10 | Discharge: 2015-06-10 | Disposition: A | Discharge status: Home / Self Care | Payer: Medicare Other | Source: Ambulatory Visit | Attending: Internal Medicine | Admitting: Internal Medicine

## 2015-06-10 ENCOUNTER — Ambulatory Visit (INDEPENDENT_AMBULATORY_CARE_PROVIDER_SITE_OTHER): Payer: Medicare Other | Admitting: Internal Medicine

## 2015-06-10 ENCOUNTER — Telehealth: Payer: Self-pay

## 2015-06-10 VITALS — BP 126/72 | HR 77 | Temp 98.0°F | Ht 66.0 in | Wt 170.0 lb

## 2015-06-10 DIAGNOSIS — I10 Essential (primary) hypertension: Secondary | ICD-10-CM | POA: Diagnosis not present

## 2015-06-10 DIAGNOSIS — G231 Progressive supranuclear ophthalmoplegia [Steele-Richardson-Olszewski]: Secondary | ICD-10-CM

## 2015-06-10 DIAGNOSIS — E039 Hypothyroidism, unspecified: Secondary | ICD-10-CM

## 2015-06-10 DIAGNOSIS — D649 Anemia, unspecified: Secondary | ICD-10-CM

## 2015-06-10 DIAGNOSIS — R918 Other nonspecific abnormal finding of lung field: Secondary | ICD-10-CM | POA: Diagnosis not present

## 2015-06-10 DIAGNOSIS — J189 Pneumonia, unspecified organism: Secondary | ICD-10-CM

## 2015-06-10 DIAGNOSIS — Z23 Encounter for immunization: Secondary | ICD-10-CM | POA: Diagnosis not present

## 2015-06-10 DIAGNOSIS — E785 Hyperlipidemia, unspecified: Secondary | ICD-10-CM

## 2015-06-10 LAB — ALT: ALT: 7 U/L (ref 0–35)

## 2015-06-10 LAB — CBC WITH DIFFERENTIAL/PLATELET
Basophils Absolute: 0 10*3/uL (ref 0.0–0.1)
Basophils Relative: 0.5 % (ref 0.0–3.0)
EOS ABS: 0.1 10*3/uL (ref 0.0–0.7)
Eosinophils Relative: 1.6 % (ref 0.0–5.0)
HCT: 33.1 % — ABNORMAL LOW (ref 36.0–46.0)
HEMOGLOBIN: 10.9 g/dL — AB (ref 12.0–15.0)
LYMPHS ABS: 2.5 10*3/uL (ref 0.7–4.0)
Lymphocytes Relative: 30.8 % (ref 12.0–46.0)
MCHC: 32.8 g/dL (ref 30.0–36.0)
MCV: 80.7 fl (ref 78.0–100.0)
MONO ABS: 0.6 10*3/uL (ref 0.1–1.0)
Monocytes Relative: 7.3 % (ref 3.0–12.0)
NEUTROS PCT: 59.8 % (ref 43.0–77.0)
Neutro Abs: 4.8 10*3/uL (ref 1.4–7.7)
Platelets: 335 10*3/uL (ref 150.0–400.0)
RBC: 4.1 Mil/uL (ref 3.87–5.11)
RDW: 17.3 % — ABNORMAL HIGH (ref 11.5–15.5)
WBC: 8 10*3/uL (ref 4.0–10.5)

## 2015-06-10 LAB — BASIC METABOLIC PANEL
BUN: 13 mg/dL (ref 6–23)
CALCIUM: 9.5 mg/dL (ref 8.4–10.5)
CO2: 26 meq/L (ref 19–32)
CREATININE: 0.63 mg/dL (ref 0.40–1.20)
Chloride: 106 mEq/L (ref 96–112)
GFR: 99.16 mL/min (ref >60.00)
Glucose, Bld: 84 mg/dL (ref 70–99)
Potassium: 3.9 mEq/L (ref 3.5–5.1)
SODIUM: 141 meq/L (ref 135–145)

## 2015-06-10 LAB — TSH: TSH: 1.51 u[IU]/mL (ref 0.35–4.50)

## 2015-06-10 LAB — AST: AST: 12 U/L (ref 0–37)

## 2015-06-10 NOTE — Telephone Encounter (Signed)
Rx ready for pick up. 

## 2015-06-10 NOTE — Progress Notes (Signed)
Pre visit review using our clinic review tool, if applicable. No additional management support is needed unless otherwise documented below in the visit note. 

## 2015-06-10 NOTE — Progress Notes (Signed)
Subjective:    Patient ID: Emily Mccullough, female    DOB: [birth year], 70 y.o.   MRN: [id number]  DOS:  06/10/2015 Type of visit - description : Routine visit Interval history: Was seen 04/08/2015 with pneumonia, admitted to hospital, subsequently went to a nursing home, back home since 05-20-15. PSP: Needs documentation to get a hospital bed. Due to PSP she has avery  difficult time transitioning to her bed, had a few falls already. Also a high risk for skin  breakdowns due to poor mobility and difficulty changing positions in bed. Her sensory  perception is also decrease.  HTN: Good compliance of medications. Pneumonia: Resolved clinically, due for a chest x-ray Anemia @ the hospital, recheck labs.    Review of Systems  Denies currently chest pain, difficulty breathing. No fever chills, appetite is okay No nausea, vomiting, diarrhea. Mild cough occasionally.  Past Medical History  Diagnosis Date  . PSP (progressive supranuclear palsy) ([place]) 12/09    neuro Dr.Willis, initially dx as parkinson, dx changed to PSP in 2010  . Depression   . GERD (gastroesophageal reflux disease)   . Osteoporosis   . Hyperlipidemia   . Hypothyroidism   . Hypertension   . Chronic back pain   . Hoarseness   . Colonic polyp   . Postmenopausal status   . SOB (shortness of breath)     s/p a negative Cardio-pulmonary stress test 08-27-04, normal PFT s 2006    . Allergic rhinitis   . Obese     Moderate  . Breast CA ([place]) 1996  . Melanoma ([place]) 2006    in situ nose,s/p mohs surgery  . Headache(784.0)     migraines   . Severe dysarthria 12/23/2014    Past Surgical History  Procedure Laterality Date  . Mastectomy    . Total abdominal hysterectomy w/ bilateral salpingoophorectomy  1990  . Fracture arm    . Spinal fusion  10/24/07    6 screws  . Cataract extraction      right    Social History   Social History  . Marital Status: Married    Spouse Name: N/A  . Number of Children: 0  .  Years of Education: N/A   Occupational History  . disable    Social History Main Topics  . Smoking status: Former Research scientist (life sciences)  . Smokeless tobacco: Never Used  . Alcohol Use: No  . Drug Use: No  . Sexual Activity: No   Other Topics Concern  . Not on file   Social History Narrative   Household-- pt ans husband   Patient is right handed   Patient does not drink caffeine.        Medication List       This list is accurate as of: 06/10/15  5:35 PM.  Always use your most recent med list.               acetaminophen 500 MG tablet  Commonly known as:  TYLENOL  Take 500 mg by mouth every 6 (six) hours as needed for moderate pain.     AMBULATORY NON FORMULARY MEDICATION  Incentive Spirometer Dx: G23.1 (PSP)     CALCIUM-VITAMIN D PO  Take 1 tablet by mouth daily.     fenofibrate 48 MG tablet  Commonly known as:  TRICOR  Take 48 mg by mouth at bedtime.     furosemide 20 MG tablet  Commonly known as:  LASIX  Take 2 tablets (40 mg total)  by mouth daily.     ibuprofen 200 MG tablet  Commonly known as:  ADVIL,MOTRIN  Take 600 mg by mouth every 6 (six) hours as needed for moderate pain.     levothyroxine 75 MCG tablet  Commonly known as:  SYNTHROID, LEVOTHROID  Take 1 tablet (75 mcg total) by mouth daily before breakfast.     losartan 100 MG tablet  Commonly known as:  COZAAR  Take 1 tablet (100 mg total) by mouth daily.     MAGNESIUM PO  Take 1 tablet by mouth every evening.     multivitamin capsule  Take 1 capsule by mouth daily.     nortriptyline 25 MG capsule  Commonly known as:  PAMELOR  Take 1 capsule (25 mg total) by mouth at bedtime.     oxyCODONE 15 MG immediate release tablet  Commonly known as:  ROXICODONE  Take 1 tablet (15 mg total) by mouth every 6 (six) hours as needed for pain (Must last 28 days).     pantoprazole 40 MG tablet  Commonly known as:  PROTONIX  Take 1 tablet (40 mg total) by mouth 2 (two) times daily.     potassium chloride SA 20  MEQ tablet  Commonly known as:  K-DUR,KLOR-CON  Take 2 tablets (40 mEq total) by mouth 2 (two) times daily.           Objective:   Physical Exam BP 126/72 mmHg  Pulse 77  Temp(Src) 98 F (36.7 C) (Oral)  Ht 5\' 6"  (1.676 m)  Wt 170 lb (77.111 kg)  BMI 27.45 kg/m2  SpO2 99% General:   Well developed, well nourished, no distress, sitting in a wheelchair, unable to move without help.Marland Kitchen  HEENT:  Face symmetric, atraumatic Lungs:  CTA B Normal respiratory effort, no intercostal retractions, no accessory muscle use. Heart: RRR,  no murmur.  No pretibial edema bilaterally  Skin: Not pale. Not jaundice Neurologic:  alert & seems oriented X3.  She seems to be at baseline, exam is consistent with PSP Psych--  No anxious or depressed appearing.      Assessment & Plan:   Assessment> HTN Hyperlipidemia Hypothyroidism Depression Osteoporosis- declined dexa 04-2014 GERD PSP dx 2009 initially dx w/ Parkinson, dx changed to PSP 2010 H/o Breast cancer 1996 H/o Melanoma 2006 H/o SOB: Negative cardiopulmonary stress test 2006, normal PFTs 2006   Plan: Pneumonia: Admitted to the hospital for pneumonia 03-2015, needs a follow-up chest x-ray HTN: good compliance of medication, BPs are checked from time to time and normal. Check a BMP Anemia: History of anemia, very low hemoglobin while in the hospital, recheck a CBC. Hypothyroidism: Check a TSH PSP: Definitely qualify for semi-electric hospital bed, gel overlay and trapezius bar. Documents signed. Primary care: Flu shot today Follow-up March as schedule

## 2015-06-10 NOTE — Telephone Encounter (Signed)
Pt seen today, hospital bed form signed, awaiting OV note closure by Dr. Larose Kells to fax OV notes.

## 2015-06-10 NOTE — Patient Instructions (Signed)
BEFORE YOU LEAVE THE OFFICE: GO TO THE LAB  Get the blood work     AFTER YOU LEAVE THE OFFICE:  Stop by the first floor and get the XR

## 2015-06-11 DIAGNOSIS — F329 Major depressive disorder, single episode, unspecified: Secondary | ICD-10-CM | POA: Diagnosis not present

## 2015-06-11 DIAGNOSIS — M810 Age-related osteoporosis without current pathological fracture: Secondary | ICD-10-CM | POA: Diagnosis not present

## 2015-06-11 DIAGNOSIS — K219 Gastro-esophageal reflux disease without esophagitis: Secondary | ICD-10-CM | POA: Diagnosis not present

## 2015-06-11 DIAGNOSIS — R471 Dysarthria and anarthria: Secondary | ICD-10-CM | POA: Diagnosis not present

## 2015-06-11 DIAGNOSIS — R1312 Dysphagia, oropharyngeal phase: Secondary | ICD-10-CM | POA: Diagnosis not present

## 2015-06-11 DIAGNOSIS — G231 Progressive supranuclear ophthalmoplegia [Steele-Richardson-Olszewski]: Secondary | ICD-10-CM | POA: Diagnosis not present

## 2015-06-11 NOTE — Telephone Encounter (Signed)
Hospital bed form, and OV note from 06/10/2015 faxed to [facility] at [phone]. From sent for scanning.

## 2015-06-11 NOTE — Telephone Encounter (Signed)
Received fax confirmation on 06/11/2015 at 0949.

## 2015-06-18 DIAGNOSIS — K219 Gastro-esophageal reflux disease without esophagitis: Secondary | ICD-10-CM | POA: Diagnosis not present

## 2015-06-18 DIAGNOSIS — R471 Dysarthria and anarthria: Secondary | ICD-10-CM | POA: Diagnosis not present

## 2015-06-18 DIAGNOSIS — R1312 Dysphagia, oropharyngeal phase: Secondary | ICD-10-CM | POA: Diagnosis not present

## 2015-06-18 DIAGNOSIS — F329 Major depressive disorder, single episode, unspecified: Secondary | ICD-10-CM | POA: Diagnosis not present

## 2015-06-18 DIAGNOSIS — M810 Age-related osteoporosis without current pathological fracture: Secondary | ICD-10-CM | POA: Diagnosis not present

## 2015-06-18 DIAGNOSIS — G231 Progressive supranuclear ophthalmoplegia [Steele-Richardson-Olszewski]: Secondary | ICD-10-CM | POA: Diagnosis not present

## 2015-06-23 ENCOUNTER — Telehealth: Payer: Self-pay | Admitting: Neurology

## 2015-06-23 NOTE — Telephone Encounter (Signed)
Pt's husband called said he got a reminder of his appt on 2/8/ but not one for his wife. He said they come at the same time for each appt. She did not have an appt. I scheduled her for 2/8 @ 2pm, his was already scheduled at 3:30. He said they go back at the same time. I explained he might have to wait but I managed to get them on the same day. FYI only

## 2015-06-24 ENCOUNTER — Ambulatory Visit: Payer: PRIVATE HEALTH INSURANCE | Admitting: Internal Medicine

## 2015-06-25 ENCOUNTER — Telehealth: Payer: Self-pay | Admitting: Internal Medicine

## 2015-06-25 ENCOUNTER — Ambulatory Visit (INDEPENDENT_AMBULATORY_CARE_PROVIDER_SITE_OTHER): Payer: Medicare Other | Admitting: Neurology

## 2015-06-25 ENCOUNTER — Encounter: Payer: Self-pay | Admitting: Neurology

## 2015-06-25 VITALS — BP 129/74 | HR 82 | Ht 66.0 in

## 2015-06-25 DIAGNOSIS — G231 Progressive supranuclear ophthalmoplegia [Steele-Richardson-Olszewski]: Secondary | ICD-10-CM

## 2015-06-25 DIAGNOSIS — R269 Unspecified abnormalities of gait and mobility: Secondary | ICD-10-CM

## 2015-06-25 DIAGNOSIS — R1310 Dysphagia, unspecified: Secondary | ICD-10-CM

## 2015-06-25 HISTORY — DX: Dysphagia, unspecified: R13.10

## 2015-06-25 HISTORY — DX: Unspecified abnormalities of gait and mobility: R26.9

## 2015-06-25 NOTE — Telephone Encounter (Signed)
Caller name:Jackson Banbury Relation to [phone] Call back number:[phone] Pharmacy:[facility] on [address]  Reason for call: pt was in [facility] and was given two prescriptions, pt got out on 05/21/15, pt would like to know if dr. Larose Kells can refill her rx  For levothyroxine (SYNTHROID, LEVOTHROID) 75 MCG tablet and fenofibrate (TRICOR) 48 MG tablet, since she is no longer in the rehab but needs the medications.

## 2015-06-25 NOTE — Patient Instructions (Signed)
Fall Prevention in the Home  Falls can cause injuries and can affect people from all age groups. There are many simple things that you can do to make your home safe and to help prevent falls. WHAT CAN I DO ON THE OUTSIDE OF MY HOME?  Regularly repair the edges of walkways and driveways and fix any cracks.  Remove high doorway thresholds.  Trim any shrubbery on the main path into your home.  Use bright outdoor lighting.  Clear walkways of debris and clutter, including tools and rocks.  Regularly check that handrails are securely fastened and in good repair. Both sides of any steps should have handrails.  Install guardrails along the edges of any raised decks or porches.  Have leaves, snow, and ice cleared regularly.  Use sand or salt on walkways during winter months.  In the garage, clean up any spills right away, including grease or oil spills. WHAT CAN I DO IN THE BATHROOM?  Use night lights.  Install grab bars by the toilet and in the tub and shower. Do not use towel bars as grab bars.  Use non-skid mats or decals on the floor of the tub or shower.  If you need to sit down while you are in the shower, use a plastic, non-slip stool..  Keep the floor dry. Immediately clean up any water that spills on the floor.  Remove soap buildup in the tub or shower on a regular basis.  Attach bath mats securely with double-sided non-slip rug tape.  Remove throw rugs and other tripping hazards from the floor. WHAT CAN I DO IN THE BEDROOM?  Use night lights.  Make sure that a bedside light is easy to reach.  Do not use oversized bedding that drapes onto the floor.  Have a firm chair that has side arms to use for getting dressed.  Remove throw rugs and other tripping hazards from the floor. WHAT CAN I DO IN THE KITCHEN?   Clean up any spills right away.  Avoid walking on wet floors.  Place frequently used items in easy-to-reach places.  If you need to reach for something  above you, use a sturdy step stool that has a grab bar.  Keep electrical cables out of the way.  Do not use floor polish or wax that makes floors slippery. If you have to use wax, make sure that it is non-skid floor wax.  Remove throw rugs and other tripping hazards from the floor. WHAT CAN I DO IN THE STAIRWAYS?  Do not leave any items on the stairs.  Make sure that there are handrails on both sides of the stairs. Fix handrails that are broken or loose. Make sure that handrails are as long as the stairways.  Check any carpeting to make sure that it is firmly attached to the stairs. Fix any carpet that is loose or worn.  Avoid having throw rugs at the top or bottom of stairways, or secure the rugs with carpet tape to prevent them from moving.  Make sure that you have a light switch at the top of the stairs and the bottom of the stairs. If you do not have them, have them installed. WHAT ARE SOME OTHER FALL PREVENTION TIPS?  Wear closed-toe shoes that fit well and support your feet. Wear shoes that have rubber soles or low heels.  When you use a stepladder, make sure that it is completely opened and that the sides are firmly locked. Have someone hold the ladder while you   are using it. Do not climb a closed stepladder.  Add color or contrast paint or tape to grab bars and handrails in your home. Place contrasting color strips on the first and last steps.  Use mobility aids as needed, such as canes, walkers, scooters, and crutches.  Turn on lights if it is dark. Replace any light bulbs that burn out.  Set up furniture so that there are clear paths. Keep the furniture in the same spot.  Fix any uneven floor surfaces.  Choose a carpet design that does not hide the edge of steps of a stairway.  Be aware of any and all pets.  Review your medicines with your healthcare provider. Some medicines can cause dizziness or changes in blood pressure, which increase your risk of falling. Talk  with your health care provider about other ways that you can decrease your risk of falls. This may include working with a physical therapist or trainer to improve your strength, balance, and endurance.   This information is not intended to replace advice given to you by your health care provider. Make sure you discuss any questions you have with your health care provider.   Document Released: 04/23/2002 Document Revised: 09/17/2014 Document Reviewed: 06/07/2014 Elsevier Interactive Patient Education 2016 Elsevier Inc.  

## 2015-06-25 NOTE — Progress Notes (Signed)
Reason for visit: PSP  Emily Mccullough is an 70 y.o. female  History of present illness:  Emily Mccullough is a 70 year old right-handed white female with a history of progressive supranuclear palsy. The patient has parkinsonism with a severe gait disorder, and some difficulty with speech and swallowing. The patient was in the hospital in November 2016 with the pneumonia. She spent several weeks in a rehabilitation facility, she returned home in early January 2017. The patient has a nurse still coming out to the house. She now has a hospital bed to help her mobilize in and out of bed to a chair. She has some back pain off and on, she takes oxycodone for this. She is coughing frequently. The patient does not cough particularly when eating. She is able to stand and take a few steps. She comes to this office for an evaluation. She is sleeping well, the pain does not keep her awake.  Past Medical History  Diagnosis Date  . PSP (progressive supranuclear palsy) ([place]) 12/09    neuro Dr.Hawk Mones, initially dx as parkinson, dx changed to PSP in 2010  . Depression   . GERD (gastroesophageal reflux disease)   . Osteoporosis   . Hyperlipidemia   . Hypothyroidism   . Hypertension   . Chronic back pain   . Hoarseness   . Colonic polyp   . Postmenopausal status   . SOB (shortness of breath)     s/p a negative Cardio-pulmonary stress test 08-27-04, normal PFT s 2006    . Allergic rhinitis   . Obese     Moderate  . Breast CA ([place]) 1996  . Melanoma ([place]) 2006    in situ nose,s/p mohs surgery  . Headache(784.0)     migraines   . Severe dysarthria 12/23/2014  . Abnormality of gait 06/25/2015  . Dysphagia 06/25/2015    Past Surgical History  Procedure Laterality Date  . Mastectomy    . Total abdominal hysterectomy w/ bilateral salpingoophorectomy  1990  . Fracture arm    . Spinal fusion  10/24/07    6 screws  . Cataract extraction      right    Family History  Problem Relation Age of Onset  .  Coronary artery disease Father     MI at age 61  . Emphysema Father   . Colon cancer Father     age  . Hypertension Father   . Stroke      GF  . Diabetes Mother   . Hypertension Mother   . Breast cancer Maternal Aunt     Social history:  reports that she has quit smoking. She has never used smokeless tobacco. She reports that she does not drink alcohol or use illicit drugs.    Allergies  Allergen Reactions  . Zostavax [Zoster Vaccine Live]     Unable to take d/t Neosporin allergy  . Neosporin [Neomycin-Bacitracin Zn-Polymyx] Rash    Medications:  Prior to Admission medications   Medication Sig Start Date End Date Taking? Authorizing Provider  acetaminophen (TYLENOL) 500 MG tablet Take 500 mg by mouth every 6 (six) hours as needed for moderate pain.    Historical Provider, MD  AMBULATORY NON FORMULARY MEDICATION Incentive Spirometer Dx: G23.1 (PSP) 12/30/14   Colon Branch, MD  CALCIUM-VITAMIN D PO Take 1 tablet by mouth daily.     Historical Provider, MD  fenofibrate (TRICOR) 48 MG tablet Take 48 mg by mouth at bedtime.    Historical Provider, MD  furosemide (LASIX) 20 MG tablet Take 2 tablets (40 mg total) by mouth daily. 03/12/15   Colon Branch, MD  ibuprofen (ADVIL,MOTRIN) 200 MG tablet Take 600 mg by mouth every 6 (six) hours as needed for moderate pain.     Historical Provider, MD  levothyroxine (SYNTHROID, LEVOTHROID) 75 MCG tablet Take 1 tablet (75 mcg total) by mouth daily before breakfast. Patient taking differently: Take 50 mcg by mouth daily before breakfast.  09/18/14   Colon Branch, MD  losartan (COZAAR) 100 MG tablet Take 1 tablet (100 mg total) by mouth daily. 09/18/14   Colon Branch, MD  MAGNESIUM PO Take 1 tablet by mouth every evening.    Historical Provider, MD  Multiple Vitamin (MULTIVITAMIN) capsule Take 1 capsule by mouth daily.      Historical Provider, MD  nortriptyline (PAMELOR) 25 MG capsule Take 1 capsule (25 mg total) by mouth at bedtime. 09/18/14   Colon Branch, MD    oxyCODONE (ROXICODONE) 15 MG immediate release tablet Take 1 tablet (15 mg total) by mouth every 6 (six) hours as needed for pain (Must last 28 days). Patient not taking: Reported on 06/10/2015 03/25/15   Kathrynn Ducking, MD  pantoprazole (PROTONIX) 40 MG tablet Take 1 tablet (40 mg total) by mouth 2 (two) times daily. 09/18/14   Colon Branch, MD  potassium chloride SA (K-DUR,KLOR-CON) 20 MEQ tablet Take 2 tablets (40 mEq total) by mouth 2 (two) times daily. 03/27/15   Colon Branch, MD    ROS:  Out of a complete 14 system review of symptoms, the patient complains only of the following symptoms, and all other reviewed systems are negative.  Cough Back pain  Blood pressure 129/74, pulse 82, height 5\' 6"  (1.676 m).  Physical Exam  General: The patient is alert and cooperative at the time of the examination.  Respiratory: Lung fields are clear.  Cardiovascular: examination: Regular rate rhythm no murmurs or rubs.  Neck: Neck is supple, no carotid bruits are noted.  Skin: No significant peripheral edema is noted.   Neurologic Exam  Mental status: The patient is alert and oriented x 3 at the time of the examination. The patient has apparent normal recent and remote memory, with an apparently normal attention span and concentration ability.   Cranial nerves: Facial symmetry is present. The patient has severe masking of the face Speech is very low amplitude, dysphonic.Marland Kitchen Extraocular movements are full, with exception of restriction of superior gaze. Eye movements are slow. Visual fields are full.  Motor: The patient has good strength in all 4 extremities.  Sensory examination: Soft touch sensation is symmetric on the face, arms, and legs.  Coordination: The patient has good finger-nose-finger and heel-to-shin bilaterally.  Gait and station: The patient can stand with assistance, she can take a few small steps with assistance. She is able to stand independently. Tandem gait was not  attempted.  Reflexes: Deep tendon reflexes are symmetric.   Assessment/Plan:  1. Progressive supranuclear palsy  2. Gait disorder  3. Dysphagia  4. Chronic low back pain  The patient is getting quite a bit worse with her speech and swallowing. The patient is at risk for aspiration pneumonia. She does have chronic low back pain, she is being treated with the oxycodone. We will continue maintenance therapy for this patient, she will follow-up in 6 months.  Jill Alexanders MD 06/25/2015 5:57 PM  [facility] [address] [place], [place] [address]  Phone [phone] Fax [phone]  

## 2015-06-26 MED ORDER — FENOFIBRATE 48 MG PO TABS: 48.0000 mg | ORAL_TABLET | Freq: Every day | ORAL | Status: DC

## 2015-06-26 MED ORDER — LEVOTHYROXINE SODIUM 50 MCG PO TABS: 50.0000 ug | ORAL_TABLET | Freq: Every day | ORAL | Status: DC

## 2015-06-26 NOTE — Telephone Encounter (Signed)
Rxs sent

## 2015-07-02 DIAGNOSIS — M810 Age-related osteoporosis without current pathological fracture: Secondary | ICD-10-CM | POA: Diagnosis not present

## 2015-07-02 DIAGNOSIS — R471 Dysarthria and anarthria: Secondary | ICD-10-CM | POA: Diagnosis not present

## 2015-07-02 DIAGNOSIS — R1312 Dysphagia, oropharyngeal phase: Secondary | ICD-10-CM | POA: Diagnosis not present

## 2015-07-02 DIAGNOSIS — G231 Progressive supranuclear ophthalmoplegia [Steele-Richardson-Olszewski]: Secondary | ICD-10-CM | POA: Diagnosis not present

## 2015-07-02 DIAGNOSIS — K219 Gastro-esophageal reflux disease without esophagitis: Secondary | ICD-10-CM | POA: Diagnosis not present

## 2015-07-02 DIAGNOSIS — F329 Major depressive disorder, single episode, unspecified: Secondary | ICD-10-CM | POA: Diagnosis not present

## 2015-07-08 ENCOUNTER — Other Ambulatory Visit: Payer: Self-pay | Admitting: Internal Medicine

## 2015-07-09 ENCOUNTER — Telehealth: Payer: Self-pay | Admitting: Neurology

## 2015-07-09 ENCOUNTER — Other Ambulatory Visit: Payer: Self-pay | Admitting: Internal Medicine

## 2015-07-09 MED ORDER — OXYCODONE HCL 15 MG PO TABS: 15.0000 mg | ORAL_TABLET | Freq: Four times a day (QID) | ORAL | Status: DC | PRN

## 2015-07-09 NOTE — Telephone Encounter (Signed)
Spouse called to request refill of oxyCODONE (ROXICODONE) 15 MG immediate release tablet

## 2015-07-09 NOTE — Telephone Encounter (Signed)
I will refill the oxycodone prescription.

## 2015-07-10 ENCOUNTER — Telehealth: Payer: Self-pay

## 2015-07-10 NOTE — Telephone Encounter (Signed)
Rx ready for pick up. 

## 2015-08-05 ENCOUNTER — Other Ambulatory Visit: Payer: Self-pay | Admitting: Internal Medicine

## 2015-08-06 ENCOUNTER — Ambulatory Visit (INDEPENDENT_AMBULATORY_CARE_PROVIDER_SITE_OTHER): Payer: Medicare Other | Admitting: Internal Medicine

## 2015-08-06 ENCOUNTER — Encounter: Payer: Self-pay | Admitting: Internal Medicine

## 2015-08-06 VITALS — BP 124/68 | HR 69 | Temp 98.0°F | Ht 66.0 in | Wt 170.0 lb

## 2015-08-06 DIAGNOSIS — D649 Anemia, unspecified: Secondary | ICD-10-CM

## 2015-08-06 DIAGNOSIS — Z09 Encounter for follow-up examination after completed treatment for conditions other than malignant neoplasm: Secondary | ICD-10-CM | POA: Diagnosis not present

## 2015-08-06 DIAGNOSIS — Z0000 Encounter for general adult medical examination without abnormal findings: Secondary | ICD-10-CM

## 2015-08-06 DIAGNOSIS — E785 Hyperlipidemia, unspecified: Secondary | ICD-10-CM

## 2015-08-06 NOTE — Assessment & Plan Note (Signed)
Td 2016;  Had a pnm shot ; Prevnar 2015; zostavax --contraindicated   No further MMG, PAPs, se previous entries , pt confirmed that today  Had a colonoscopy 09-2006, 2 polyps, tubular adenomas, A notation was made that from the next colonoscopy she will need for deep sedation. Pt desires no further screening

## 2015-08-06 NOTE — Progress Notes (Signed)
Pre visit review using our clinic review tool, if applicable. No additional management support is needed unless otherwise documented below in the visit note. 

## 2015-08-06 NOTE — Patient Instructions (Signed)
Get your blood work before you leave   Next visit in 6 months  Fall Prevention and Home Safety Falls cause injuries and can affect all age groups. It is possible to use preventive measures to significantly decrease the likelihood of falls. There are many simple measures which can make your home safer and prevent falls. OUTDOORS  Repair cracks and edges of walkways and driveways.  Remove high doorway thresholds.  Trim shrubbery on the main path into your home.  Have good outside lighting.  Clear walkways of tools, rocks, debris, and clutter.  Check that handrails are not broken and are securely fastened. Both sides of steps should have handrails.  Have leaves, snow, and ice cleared regularly.  Use sand or salt on walkways during winter months.  In the garage, clean up grease or oil spills. BATHROOM  Install night lights.  Install grab bars by the toilet and in the tub and shower.  Use non-skid mats or decals in the tub or shower.  Place a plastic non-slip stool in the shower to sit on, if needed.  Keep floors dry and clean up all water on the floor immediately.  Remove soap buildup in the tub or shower on a regular basis.  Secure bath mats with non-slip, double-sided rug tape.  Remove throw rugs and tripping hazards from the floors. BEDROOMS  Install night lights.  Make sure a bedside light is easy to reach.  Do not use oversized bedding.  Keep a telephone by your bedside.  Have a firm chair with side arms to use for getting dressed.  Remove throw rugs and tripping hazards from the floor. KITCHEN  Keep handles on pots and pans turned toward the center of the stove. Use back burners when possible.  Clean up spills quickly and allow time for drying.  Avoid walking on wet floors.  Avoid hot utensils and knives.  Position shelves so they are not too high or low.  Place commonly used objects within easy reach.  If necessary, use a sturdy step stool with  a grab bar when reaching.  Keep electrical cables out of the way.  Do not use floor polish or wax that makes floors slippery. If you must use wax, use non-skid floor wax.  Remove throw rugs and tripping hazards from the floor. STAIRWAYS  Never leave objects on stairs.  Place handrails on both sides of stairways and use them. Fix any loose handrails. Make sure handrails on both sides of the stairways are as long as the stairs.  Check carpeting to make sure it is firmly attached along stairs. Make repairs to worn or loose carpet promptly.  Avoid placing throw rugs at the top or bottom of stairways, or properly secure the rug with carpet tape to prevent slippage. Get rid of throw rugs, if possible.  Have an electrician put in a light switch at the top and bottom of the stairs. OTHER FALL PREVENTION TIPS  Wear low-heel or rubber-soled shoes that are supportive and fit well. Wear closed toe shoes.  When using a stepladder, make sure it is fully opened and both spreaders are firmly locked. Do not climb a closed stepladder.  Add color or contrast paint or tape to grab bars and handrails in your home. Place contrasting color strips on first and last steps.  Learn and use mobility aids as needed. Install an electrical emergency response system.  Turn on lights to avoid dark areas. Replace light bulbs that burn out immediately. Get light switches   Get light switches that glow.  Arrange furniture to create clear pathways. Keep furniture in the same place.  Firmly attach carpet with non-skid or double-sided tape.  Eliminate uneven floor surfaces.  Select a carpet pattern that does not visually hide the edge of steps.  Be aware of all pets. OTHER HOME SAFETY TIPS  Set the water temperature for 120 F (48.8 C).  Keep emergency numbers on or near the telephone.  Keep smoke detectors on every level of the home and near sleeping areas. Document Released: 04/23/2002 Document Revised: 11/02/2011  Document Reviewed: 07/23/2011 ExitCare Patient Information 2015 [employer]. This information is not intended to replace advice given to you by your health care provider. Make sure you discuss any questions you have with your health care provider.   Preventive Care for Adults Ages 65 and over  Blood pressure check.** / Every 1 to 2 years.  Lipid and cholesterol check.**/ Every 5 years beginning at age 20.  Lung cancer screening. / Every year if you are aged 55-80 years and have a 30-pack-year history of smoking and currently smoke or have quit within the past 15 years. Yearly screening is stopped once you have quit smoking for at least 15 years or develop a health problem that would prevent you from having lung cancer treatment.  Fecal occult blood test (FOBT) of stool. / Every year beginning at age 50 and continuing until age 75. You may not have to do this test if you get a colonoscopy every 10 years.  Flexible sigmoidoscopy** or colonoscopy.** / Every 5 years for a flexible sigmoidoscopy or every 10 years for a colonoscopy beginning at age 50 and continuing until age 75.  Hepatitis C blood test.** / For all people born from 1945 through 1965 and any individual with known risks for hepatitis C.  Abdominal aortic aneurysm (AAA) screening.** / A one-time screening for ages 65 to 75 years who are current or former smokers.  Skin self-exam. / Monthly.  Influenza vaccine. / Every year.  Tetanus, diphtheria, and acellular pertussis (Tdap/Td) vaccine.** / 1 dose of Td every 10 years.  Varicella vaccine.** / Consult your health care provider.  Zoster vaccine.** / 1 dose for adults aged 60 years or older.  Pneumococcal 13-valent conjugate (PCV13) vaccine.** / Consult your health care provider.  Pneumococcal polysaccharide (PPSV23) vaccine.** / 1 dose for all adults aged 65 years and older.  Meningococcal vaccine.** / Consult your health care provider.  Hepatitis A vaccine.** /  Consult your health care provider.  Hepatitis B vaccine.** / Consult your health care provider.  Haemophilus influenzae type b (Hib) vaccine.** / Consult your health care provider. **Family history and personal history of risk and conditions may change your health care provider's recommendations. Document Released: 06/29/2001 Document Revised: 05/08/2013 Document Reviewed: 09/28/2010 ExitCare Patient Information 2015 [employer]. This information is not intended to replace advice given to you by your health care provider. Make sure you discuss any questions you have with your health care provider.   

## 2015-08-06 NOTE — Progress Notes (Signed)
Subjective:    Patient ID: Emily Mccullough, female    DOB: 1945-08-08, 70 y.o.   MRN: [id number]  DOS:  08/06/2015 Type of visit - description : Here with her husband  Here for Medicare AWV:  1. Risk factors based on Past M, S, F history: reviewed 2. Physical Activities:  Very limited at this time   3. Depression/mood: neg screening   4. Hearing:  No problemss noted or reported   5. ADL's:  Able to self care for the most part, husband helps 52. Fall Risk: high, prevention discussed   7. home Safety: does feel safe at home   8. Height, weight, & visual acuity: see VS, has trouble focussing (related to PSP ?) 9. Counseling: provided 10. Labs ordered based on risk factors: if needed   11. Referral Coordination: if needed 12. Care Plan, see assessment and plan   13. Cognitive Assessment: motor skills limited, a ox3 14. Care team updated-- neuro Dr Jannifer Franklin 15. Personalized plan provided,  see instructions 16. Does have a HC POA  We also discussed the following PSP: Neurology note reviewed, is getting slightly worse Hyperlipidemia: Good compliance of medications Back pain: On oxycodone as needed, good compliance. Hypothyroidism: Good compliance w/  medications.  , Review of Systems  Constitutional: No fever. No chills. No unexplained wt changes. No unusual sweats  HEENT: No dental problems, no ear discharge, no facial swelling, no voice changes. No eye discharge, no eye  redness , no  intolerance to light   Respiratory: No wheezing , no  difficulty breathing. On and off cough, some chest congestion, no sputum production. No fever or chills.  Cardiovascular: No CP, no leg swelling , no  Palpitations  GI: no nausea, no vomiting, no diarrhea , no  abdominal pain.  No blood in the stools. No dysphagia, no odynophagia    Endocrine: No polyphagia, no polyuria , no polydipsia  GU: No dysuria, gross hematuria, difficulty urinating. No urinary urgency, no  frequency.  Musculoskeletal: No joint swellings or unusual aches or pains  Skin: No change in the color of the skin, palor , no  Rash  Allergic, immunologic: No environmental allergies , no  food allergies  Neurological:  . No headaches.   Hematological: No enlarged lymph nodes, no easy bruising , no unusual bleedings  Psychiatry: No suicidal ideas, no hallucinations, no beavior problems, no confusion.  No unusual/severe anxiety, no depression  Past Medical History  Diagnosis Date  . PSP (progressive supranuclear palsy) ([place]) 12/09    neuro Dr.Willis, initially dx as parkinson, dx changed to PSP in 2010  . Depression   . GERD (gastroesophageal reflux disease)   . Osteoporosis   . Hyperlipidemia   . Hypothyroidism   . Hypertension   . Chronic back pain   . Hoarseness   . Colonic polyp   . Postmenopausal status   . SOB (shortness of breath)     s/p a negative Cardio-pulmonary stress test 08-27-04, normal PFT s 2006    . Allergic rhinitis   . Obese     Moderate  . Breast CA ([name]) 1996  . Melanoma ([place]) 2006    in situ nose,s/p mohs surgery  . Headache(784.0)     migraines   . Severe dysarthria 12/23/2014  . Abnormality of gait 06/25/2015  . Dysphagia 06/25/2015  . History of blood transfusion 2009    [facility]    Past Surgical History  Procedure Laterality Date  . Mastectomy    .  Total abdominal hysterectomy w/ bilateral salpingoophorectomy  1990  . Fracture arm    . Spinal fusion  10/24/07    6 screws  . Cataract extraction      right    Social History   Social History  . Marital Status: Married    Spouse Name: N/A  . Number of Children: 0  . Years of Education: N/A   Occupational History  . disable    Social History Main Topics  . Smoking status: Former Research scientist (life sciences)  . Smokeless tobacco: Never Used  . Alcohol Use: No  . Drug Use: No  . Sexual Activity: No   Other Topics Concern  . Not on file   Social History Narrative   Household-- pt and husband    Patient is right handed   Patient does not drink caffeine.     Family History  Problem Relation Age of Onset  . Coronary artery disease Father     MI at age 17  . Emphysema Father   . Colon cancer Father     age  . Hypertension Father   . Stroke      GF  . Diabetes Mother   . Hypertension Mother   . Breast cancer Maternal Aunt        Medication List       This list is accurate as of: 08/06/15 11:59 PM.  Always use your most recent med list.               acetaminophen 500 MG tablet  Commonly known as:  TYLENOL  Take 500 mg by mouth every 6 (six) hours as needed for moderate pain.     AMBULATORY NON FORMULARY MEDICATION  Incentive Spirometer Dx: G23.1 (PSP)     CALCIUM-VITAMIN D PO  Take 1 tablet by mouth daily.     fenofibrate 48 MG tablet  Commonly known as:  TRICOR  Take 1 tablet (48 mg total) by mouth at bedtime.     furosemide 20 MG tablet  Commonly known as:  LASIX  Take 2 tablets (40 mg total) by mouth daily.     ibuprofen 200 MG tablet  Commonly known as:  ADVIL,MOTRIN  Take 600 mg by mouth every 6 (six) hours as needed for moderate pain.     levothyroxine 50 MCG tablet  Commonly known as:  SYNTHROID, LEVOTHROID  Take 1 tablet (50 mcg total) by mouth daily before breakfast.     losartan 100 MG tablet  Commonly known as:  COZAAR  Take 1 tablet (100 mg total) by mouth daily.     MAGNESIUM PO  Take 1 tablet by mouth every evening.     multivitamin capsule  Take 1 capsule by mouth daily.     nortriptyline 25 MG capsule  Commonly known as:  PAMELOR  Take 1 capsule (25 mg total) by mouth at bedtime.     oxyCODONE 15 MG immediate release tablet  Commonly known as:  ROXICODONE  Take 1 tablet (15 mg total) by mouth every 6 (six) hours as needed for pain (Must last 28 days).     pantoprazole 40 MG tablet  Commonly known as:  PROTONIX  Take 1 tablet (40 mg total) by mouth 2 (two) times daily.     potassium chloride SA 20 MEQ tablet  Commonly  known as:  K-DUR,KLOR-CON  Take 2 tablets (40 mEq total) by mouth 2 (two) times daily.           Objective:  Physical Exam BP 124/68 mmHg  Pulse 69  Temp(Src) 98 F (36.7 C) (Oral)  Ht 5\' 6"  (1.676 m)  Wt 170 lb (77.111 kg)  BMI 27.45 kg/m2  SpO2 96% General:   Well developed, well nourished . NAD.  Neck: No thyromegaly HEENT:  Normocephalic . Face symmetric, atraumatic Lungs:  CTA B Normal respiratory effort, no intercostal retractions, no accessory muscle use. Heart: RRR,  no murmur.  No pretibial edema bilaterally  Skin: Not pale. Not jaundice Neurologic:  alert & oriented X3.  Psych--   Very difficult to communicate with the patient due to PSP however she has not seems anxious, depressed. She tries to be cooperative. .      Assessment & Plan:   Assessment> HTN Hyperlipidemia Hypothyroidism Depression Osteoporosis- declined dexa 04-2014, again 07-2015 Back pain- oxycodone 2-3 qd  GERD PSP dx 2009 initially dx w/ Parkinson, dx changed to PSP 2010 H/o Breast cancer 1996 H/o Melanoma 2006 H/o SOB: Negative cardiopulmonary stress test 2006, normal PFTs 2006   Plan: HTN: Continue losartan, Lasix, potassium. Last BMP satisfactory Hyperlipidemia: Continue TriCor, check FLP. Hypothyroidism: Last TSH satisfactory, continue Synthroid Osteoporosis: declined dexa again Anemia: Last hemoglobin improving, recheck a CBC, iron and ferritin Cough: no fever, probably due to poor respiratory effort and secresions accumulation, denies choking at this time RTC 6 months

## 2015-08-07 LAB — LIPID PANEL
CHOL/HDL RATIO: 3
Cholesterol: 171 mg/dL (ref 0–200)
HDL: 57.7 mg/dL (ref >39.00)
LDL CALC: 88 mg/dL (ref 0–99)
NONHDL: 112.91
Triglycerides: 127 mg/dL (ref 0.0–149.0)
VLDL: 25.4 mg/dL (ref 0.0–40.0)

## 2015-08-07 LAB — CBC WITH DIFFERENTIAL/PLATELET
BASOS ABS: 0.1 10*3/uL (ref 0.0–0.1)
Basophils Relative: 1 % (ref 0.0–3.0)
EOS ABS: 0.2 10*3/uL (ref 0.0–0.7)
Eosinophils Relative: 1.7 % (ref 0.0–5.0)
HCT: 30.8 % — ABNORMAL LOW (ref 36.0–46.0)
Hemoglobin: 10.2 g/dL — ABNORMAL LOW (ref 12.0–15.0)
LYMPHS ABS: 2.8 10*3/uL (ref 0.7–4.0)
Lymphocytes Relative: 27 % (ref 12.0–46.0)
MCHC: 33.1 g/dL (ref 30.0–36.0)
MCV: 79.5 fl (ref 78.0–100.0)
MONO ABS: 0.5 10*3/uL (ref 0.1–1.0)
Monocytes Relative: 4.5 % (ref 3.0–12.0)
NEUTROS PCT: 65.8 % (ref 43.0–77.0)
Neutro Abs: 6.9 10*3/uL (ref 1.4–7.7)
Platelets: 378 10*3/uL (ref 150.0–400.0)
RBC: 3.88 Mil/uL (ref 3.87–5.11)
RDW: 16.5 % — ABNORMAL HIGH (ref 11.5–15.5)
WBC: 10.5 10*3/uL (ref 4.0–10.5)

## 2015-08-07 LAB — IRON: Iron: 29 ug/dL — ABNORMAL LOW (ref 42–145)

## 2015-08-07 NOTE — Assessment & Plan Note (Signed)
HTN: Continue losartan, Lasix, potassium. Last BMP satisfactory Hyperlipidemia: Continue TriCor, check FLP. Hypothyroidism: Last TSH satisfactory, continue Synthroid Osteoporosis: declined dexa again Anemia: Last hemoglobin improving, recheck a CBC, iron and ferritin Cough: no fever, probably due to poor respiratory effort and secresions accumulation, denies choking at this time RTC 6 months

## 2015-08-08 LAB — FERRITIN: FERRITIN: 8.2 ng/mL — AB (ref 10.0–291.0)

## 2015-08-11 ENCOUNTER — Telehealth: Payer: Self-pay

## 2015-08-11 MED ORDER — FERROUS SULFATE 325 (65 FE) MG PO TABS: 325.0000 mg | ORAL_TABLET | Freq: Two times a day (BID) | ORAL | Status: DC

## 2015-08-11 NOTE — Telephone Encounter (Signed)
Rx sent 

## 2015-08-11 NOTE — Telephone Encounter (Signed)
-----   Message from Colon Branch, MD sent at 08/11/2015 10:58 AM EDT ----- Regarding: send a prescription FeSO4  325 mg 1 tab twice a day, #60, 6 RF

## 2015-08-13 ENCOUNTER — Other Ambulatory Visit: Payer: Self-pay | Admitting: Neurology

## 2015-08-13 NOTE — Telephone Encounter (Signed)
Spouse called to request refill of oxyCODONE (ROXICODONE) 15 MG immediate release tablet

## 2015-08-14 MED ORDER — OXYCODONE HCL 15 MG PO TABS: 15.0000 mg | ORAL_TABLET | Freq: Four times a day (QID) | ORAL | Status: DC | PRN

## 2015-08-14 NOTE — Telephone Encounter (Signed)
Spoke to pts husband and let him know prescription ready for pick up.  Placed up front.

## 2015-09-15 ENCOUNTER — Other Ambulatory Visit: Payer: Self-pay | Admitting: Neurology

## 2015-09-15 MED ORDER — OXYCODONE HCL 15 MG PO TABS: 15.0000 mg | ORAL_TABLET | Freq: Four times a day (QID) | ORAL | Status: DC | PRN

## 2015-09-15 NOTE — Telephone Encounter (Signed)
Patient's husband is calling to order a written Rx Oxycodone 15 mg immediate release tablets.  Thanks!

## 2015-09-15 NOTE — Telephone Encounter (Signed)
Last OV 06/25/15 w/ f/u scheduled 12/24/15. Last RF 08/14/15.

## 2015-09-16 NOTE — Telephone Encounter (Signed)
Rx printed, signed, up front for pick-up. 

## 2015-10-04 ENCOUNTER — Other Ambulatory Visit: Payer: Self-pay | Admitting: Internal Medicine

## 2015-10-15 ENCOUNTER — Other Ambulatory Visit: Payer: Self-pay | Admitting: Neurology

## 2015-10-15 ENCOUNTER — Other Ambulatory Visit: Payer: Self-pay | Admitting: Internal Medicine

## 2015-10-15 MED ORDER — OXYCODONE HCL 15 MG PO TABS: 15.0000 mg | ORAL_TABLET | Freq: Four times a day (QID) | ORAL | Status: DC | PRN

## 2015-10-15 NOTE — Telephone Encounter (Signed)
Last OV 06/25/15 w/ f/u scheduled 12/24/15. Last filled 09/15/15

## 2015-10-15 NOTE — Telephone Encounter (Signed)
Patient's husband is requesting a refill of oxyCODONE (ROXICODONE) 15 MG immediate release tablet for the patient. I advised the Rx will be ready in 24 hours unless the nurse advices otherwise.

## 2015-10-16 NOTE — Telephone Encounter (Signed)
Rx printed, signed, up front for pick-up. 

## 2015-10-22 ENCOUNTER — Other Ambulatory Visit: Payer: Self-pay | Admitting: Internal Medicine

## 2015-11-17 ENCOUNTER — Other Ambulatory Visit: Payer: Self-pay | Admitting: Neurology

## 2015-11-17 MED ORDER — OXYCODONE HCL 15 MG PO TABS: 15.0000 mg | ORAL_TABLET | Freq: Four times a day (QID) | ORAL | Status: DC | PRN

## 2015-11-17 NOTE — Telephone Encounter (Signed)
Last OV 06/25/15 Follow-up scheduled 12/24/15 Last refill 10/15/15

## 2015-11-17 NOTE — Telephone Encounter (Signed)
Rx placed up front for pick up. 

## 2015-11-17 NOTE — Telephone Encounter (Signed)
Patient requesting refill of oxyCODONE (ROXICODONE) 15 MG immediate release tablet Pharmacy: pick up

## 2015-11-17 NOTE — Telephone Encounter (Signed)
Rx printed, signed, up front for pick-up. 

## 2015-12-24 ENCOUNTER — Encounter: Payer: Self-pay | Admitting: Neurology

## 2015-12-24 ENCOUNTER — Ambulatory Visit (INDEPENDENT_AMBULATORY_CARE_PROVIDER_SITE_OTHER): Payer: Medicare Other | Admitting: Neurology

## 2015-12-24 VITALS — BP 108/64 | HR 60 | Ht 66.0 in | Wt 170.0 lb

## 2015-12-24 DIAGNOSIS — R269 Unspecified abnormalities of gait and mobility: Secondary | ICD-10-CM

## 2015-12-24 DIAGNOSIS — R471 Dysarthria and anarthria: Secondary | ICD-10-CM

## 2015-12-24 DIAGNOSIS — G231 Progressive supranuclear ophthalmoplegia [Steele-Richardson-Olszewski]: Secondary | ICD-10-CM

## 2015-12-24 MED ORDER — OXYCODONE HCL 15 MG PO TABS: 15.0000 mg | ORAL_TABLET | Freq: Four times a day (QID) | ORAL | 0 refills | Status: DC | PRN

## 2015-12-24 NOTE — Progress Notes (Signed)
Reason for visit: Progressive supranuclear palsy  Emily Mccullough is an 70 y.o. female  History of present illness:  Emily Mccullough is a 70 year old right-handed white female with a history of progressive supranuclear palsy associated with a significant gait disorder. The patient is essentially nonambulatory at this time, she can stand and help with transfers. The patient has not had any falls and no skin issues since last seen. The patient has had significant problems with dysarthria. The patient denies any severe problems with swallowing, but her husband will note a wet quality with her cough frequently. The patient has had a pneumonia in November 2016, speech therapy evaluation did show significant swallowing problems with oral and pharyngeal dysphagia. A dysphagia 3 diet was recommended at that time with thin liquids. The patient has low back pain, she takes oxycodone if needed for this, her pain level is currently under fairly good control.  Past Medical History:  Diagnosis Date  . Abnormality of gait 06/25/2015  . Allergic rhinitis   . Breast CA ([place]) 1996  . Chronic back pain   . Colonic polyp   . Depression   . Dysphagia 06/25/2015  . GERD (gastroesophageal reflux disease)   . Headache(784.0)    migraines   . History of blood transfusion 2009   [facility]  . Hoarseness   . Hyperlipidemia   . Hypertension   . Hypothyroidism   . Melanoma ([place]) 2006   in situ nose,s/p mohs surgery  . Obese    Moderate  . Osteoporosis   . Postmenopausal status   . PSP (progressive supranuclear palsy) ([place]) 12/09   neuro Dr.[place] Ice, initially dx as parkinson, dx changed to PSP in 2010  . Severe dysarthria 12/23/2014  . SOB (shortness of breath)    s/p a negative Cardio-pulmonary stress test 08-27-04, normal PFT s 2006      Past Surgical History:  Procedure Laterality Date  . CATARACT EXTRACTION     right  . fracture arm    . MASTECTOMY    . SPINAL FUSION  10/24/07   6 screws  . TOTAL  ABDOMINAL HYSTERECTOMY W/ BILATERAL SALPINGOOPHORECTOMY  1990    Family History  Problem Relation Age of Onset  . Coronary artery disease Father     MI at age 71  . Emphysema Father   . Colon cancer Father     age  . Hypertension Father   . Diabetes Mother   . Hypertension Mother   . Stroke      GF  . Breast cancer Maternal Aunt     Social history:  reports that she has quit smoking. She has never used smokeless tobacco. She reports that she does not drink alcohol or use drugs.    Allergies  Allergen Reactions  . Zostavax [Zoster Vaccine Live]     Unable to take d/t Neosporin allergy  . Neosporin [Neomycin-Bacitracin Zn-Polymyx] Rash    Medications:  Prior to Admission medications   Medication Sig Start Date End Date Taking? Authorizing Provider  acetaminophen (TYLENOL) 500 MG tablet Take 500 mg by mouth every 6 (six) hours as needed for moderate pain.   Yes Historical Provider, MD  AMBULATORY NON FORMULARY MEDICATION Incentive Spirometer Dx: G23.1 (PSP) 12/30/14  Yes Colon Branch, MD  CALCIUM-VITAMIN D PO Take 1 tablet by mouth daily.    Yes Historical Provider, MD  fenofibrate (TRICOR) 48 MG tablet Take 1 tablet (48 mg total) by mouth at bedtime. 10/22/15  Yes [place]  Paz, MD  ferrous sulfate 325 (65 FE) MG tablet Take 1 tablet (325 mg total) by mouth 2 (two) times daily with a meal. 08/11/15  Yes Colon Branch, MD  furosemide (LASIX) 20 MG tablet Take 2 tablets (40 mg total) by mouth daily. 10/06/15  Yes Colon Branch, MD  ibuprofen (ADVIL,MOTRIN) 200 MG tablet Take 600 mg by mouth every 6 (six) hours as needed for moderate pain.    Yes Historical Provider, MD  levothyroxine (SYNTHROID, LEVOTHROID) 50 MCG tablet Take 1 tablet (50 mcg total) by mouth daily before breakfast. 10/15/15  Yes Colon Branch, MD  losartan (COZAAR) 100 MG tablet Take 1 tablet (100 mg total) by mouth daily. 07/09/15  Yes Colon Branch, MD  MAGNESIUM PO Take 1 tablet by mouth every evening.   Yes Historical Provider, MD    Multiple Vitamin (MULTIVITAMIN) capsule Take 1 capsule by mouth daily.     Yes Historical Provider, MD  nortriptyline (PAMELOR) 25 MG capsule Take 1 capsule (25 mg total) by mouth at bedtime. 10/06/15  Yes Colon Branch, MD  oxyCODONE (ROXICODONE) 15 MG immediate release tablet Take 1 tablet (15 mg total) by mouth every 6 (six) hours as needed for pain (Must last 28 days). 11/17/15  Yes Kathrynn Ducking, MD  pantoprazole (PROTONIX) 40 MG tablet Take 1 tablet (40 mg total) by mouth 2 (two) times daily. 08/05/15  Yes Colon Branch, MD  potassium chloride SA (K-DUR,KLOR-CON) 20 MEQ tablet Take 2 tablets (40 mEq total) by mouth 2 (two) times daily. 03/27/15  Yes Colon Branch, MD    ROS:  Out of a complete 14 system review of symptoms, the patient complains only of the following symptoms, and all other reviewed systems are negative.  Walking difficulty Back pain  Blood pressure 108/64, pulse 60, height 5\' 6"  (1.676 m), weight 170 lb (77.1 kg).  Physical Exam  General: The patient is alert and cooperative at the time of the examination. The patient is moderately obese.  Respiratory: Lung fields are clear.  Cardiovascular: Regular rate and rhythm, no murmurs or rubs are noted.  Skin: No significant peripheral edema is noted.   Neurologic Exam  Mental status: The patient is alert and oriented x 3 at the time of the examination. The patient has apparent normal recent and remote memory, with an apparently normal attention span and concentration ability.   Cranial nerves: Facial symmetry is present. Speech is normal, no aphasia or dysarthria is noted. Extraocular movements are full in the horizontal plane, the patient has severe vertical gaze paresis. Visual fields are full. Severe masking of the face is seen.  Motor: The patient has good strength in all 4 extremities.  Sensory examination: Soft touch sensation is symmetric on the face, arms, and legs.  Coordination: The patient has good  finger-nose-finger and heel-to-shin bilaterally.  Gait and station: The patient could not be ambulated, she is wheelchair-bound.  Reflexes: Deep tendon reflexes are symmetric.   ST eval 04/10/15:  SLP Diet Recommendations Dysphagia 3 (Mech soft) solids;Thin liquid  Liquid Administration via Cup, straw  Medication Administration Crushed with puree  Compensations -head turn left, dry swallows, cough  Postural Changes -sit upright fully             Assessment/Plan:  1. Progressive supranuclear palsy  2. Gait disorder  3. Dysphagia, dysarthria  The patient is having increasing problems with talking. She denies any significant issues with swallowing, but she may be having some  problems with aspiration that may be silent. The patient is to use an incentive spirometer on a regular basis. A prescription was given for her oxycodone. She will follow-up in 6 months, sooner if needed.  Jill Alexanders MD 12/24/2015 3:13 PM  [facility] [address] [place], [place] [address]  Phone [phone] Fax [phone]

## 2016-01-18 ENCOUNTER — Other Ambulatory Visit: Payer: Self-pay | Admitting: Internal Medicine

## 2016-02-02 ENCOUNTER — Other Ambulatory Visit: Payer: Self-pay | Admitting: Internal Medicine

## 2016-02-04 DIAGNOSIS — Z961 Presence of intraocular lens: Secondary | ICD-10-CM | POA: Diagnosis not present

## 2016-02-04 DIAGNOSIS — D3131 Benign neoplasm of right choroid: Secondary | ICD-10-CM | POA: Diagnosis not present

## 2016-02-04 DIAGNOSIS — H2512 Age-related nuclear cataract, left eye: Secondary | ICD-10-CM | POA: Diagnosis not present

## 2016-02-04 DIAGNOSIS — H04123 Dry eye syndrome of bilateral lacrimal glands: Secondary | ICD-10-CM | POA: Diagnosis not present

## 2016-02-06 ENCOUNTER — Ambulatory Visit (INDEPENDENT_AMBULATORY_CARE_PROVIDER_SITE_OTHER): Payer: Medicare Other | Admitting: Internal Medicine

## 2016-02-06 ENCOUNTER — Encounter: Payer: Self-pay | Admitting: Internal Medicine

## 2016-02-06 VITALS — BP 118/66 | HR 71 | Temp 98.4°F | Resp 14 | Ht 66.0 in | Wt 170.0 lb

## 2016-02-06 DIAGNOSIS — E039 Hypothyroidism, unspecified: Secondary | ICD-10-CM | POA: Diagnosis not present

## 2016-02-06 DIAGNOSIS — Z23 Encounter for immunization: Secondary | ICD-10-CM | POA: Diagnosis not present

## 2016-02-06 DIAGNOSIS — G231 Progressive supranuclear ophthalmoplegia [Steele-Richardson-Olszewski]: Secondary | ICD-10-CM

## 2016-02-06 DIAGNOSIS — F32A Depression, unspecified: Secondary | ICD-10-CM

## 2016-02-06 DIAGNOSIS — I10 Essential (primary) hypertension: Secondary | ICD-10-CM

## 2016-02-06 DIAGNOSIS — F329 Major depressive disorder, single episode, unspecified: Secondary | ICD-10-CM | POA: Diagnosis not present

## 2016-02-06 LAB — BASIC METABOLIC PANEL
BUN: 16 mg/dL (ref 7–25)
CALCIUM: 9.5 mg/dL (ref 8.6–10.4)
CO2: 26 mmol/L (ref 20–31)
CREATININE: 0.72 mg/dL (ref 0.60–0.93)
Chloride: 105 mmol/L (ref 98–110)
GLUCOSE: 81 mg/dL (ref 65–99)
Potassium: 3.8 mmol/L (ref 3.5–5.3)
Sodium: 142 mmol/L (ref 135–146)

## 2016-02-06 LAB — TSH: TSH: 2.27 mIU/L

## 2016-02-06 NOTE — Progress Notes (Signed)
Subjective:    Patient ID: Emily Mccullough, female    DOB: 01/17/1946, 70 y.o.   MRN: [id number]  DOS:  02/06/2016 Type of visit - description : Routine checkup, here with her has been Interval history: Although the interview was challenging I was able to communicate with her. Reports back pain that is controlled with oxycodone, she says she doesn't have any major concerns. Good medication compliance.  Review of Systems  Has occasional cough but no fever, chills, difficulty breathing. Stools are normal in color, no constipation.  Past Medical History:  Diagnosis Date  . Abnormality of gait 06/25/2015  . Allergic rhinitis   . Breast CA ([place]) 1996  . Chronic back pain   . Colonic polyp   . Depression   . Dysphagia 06/25/2015  . GERD (gastroesophageal reflux disease)   . Headache(784.0)    migraines   . History of blood transfusion 2009   [facility]  . Hoarseness   . Hyperlipidemia   . Hypertension   . Hypothyroidism   . Melanoma ([place]) 2006   in situ nose,s/p mohs surgery  . Obese    Moderate  . Osteoporosis   . Postmenopausal status   . PSP (progressive supranuclear palsy) ([employer]) 12/09   neuro Dr.Willis, initially dx as parkinson, dx changed to PSP in 2010  . Severe dysarthria 12/23/2014  . SOB (shortness of breath)    s/p a negative Cardio-pulmonary stress test 08-27-04, normal PFT s 2006      Past Surgical History:  Procedure Laterality Date  . CATARACT EXTRACTION     right  . fracture arm    . MASTECTOMY    . SPINAL FUSION  10/24/07   6 screws  . TOTAL ABDOMINAL HYSTERECTOMY W/ BILATERAL SALPINGOOPHORECTOMY  1990    Social History   Social History  . Marital status: Married    Spouse name: N/A  . Number of children: 0  . Years of education: N/A   Occupational History  . disable Unemployed   Social History Main Topics  . Smoking status: Former Research scientist (life sciences)  . Smokeless tobacco: Never Used  . Alcohol use No  . Drug use: No  . Sexual activity: No   Other  Topics Concern  . Not on file   Social History Narrative   Household-- pt and husband   Patient is right handed   Patient does not drink caffeine.        Medication List       Accurate as of 02/06/16  1:41 PM. Always use your most recent med list.          acetaminophen 500 MG tablet Commonly known as:  TYLENOL Take 500 mg by mouth every 6 (six) hours as needed for moderate pain.   AMBULATORY NON FORMULARY MEDICATION Incentive Spirometer Dx: G23.1 (PSP)   CALCIUM-VITAMIN D PO Take 1 tablet by mouth daily.   fenofibrate 48 MG tablet Commonly known as:  TRICOR Take 1 tablet (48 mg total) by mouth at bedtime.   ferrous sulfate 325 (65 FE) MG tablet Take 1 tablet (325 mg total) by mouth 2 (two) times daily with a meal.   furosemide 20 MG tablet Commonly known as:  LASIX Take 2 tablets (40 mg total) by mouth daily.   ibuprofen 200 MG tablet Commonly known as:  ADVIL,MOTRIN Take 600 mg by mouth every 6 (six) hours as needed for moderate pain.   levothyroxine 50 MCG tablet Commonly known as:  SYNTHROID, LEVOTHROID Take 1  tablet (50 mcg total) by mouth daily before breakfast.   losartan 100 MG tablet Commonly known as:  COZAAR Take 1 tablet (100 mg total) by mouth daily.   MAGNESIUM PO Take 1 tablet by mouth every evening.   multivitamin capsule Take 1 capsule by mouth daily.   nortriptyline 25 MG capsule Commonly known as:  PAMELOR Take 1 capsule (25 mg total) by mouth at bedtime.   oxyCODONE 15 MG immediate release tablet Commonly known as:  ROXICODONE Take 1 tablet (15 mg total) by mouth every 6 (six) hours as needed for pain (Must last 28 days).   pantoprazole 40 MG tablet Commonly known as:  PROTONIX Take 1 tablet (40 mg total) by mouth 2 (two) times daily.   potassium chloride SA 20 MEQ tablet Commonly known as:  K-DUR,KLOR-CON Take 2 tablets (40 mEq total) by mouth 2 (two) times daily.          Objective:   Physical Exam BP 118/66 (BP  Location: Right Arm, Patient Position: Sitting, Cuff Size: Normal)   Pulse 71   Temp 98.4 F (36.9 C) (Oral)   Resp 14   Ht 5\' 6"  (1.676 m)   Wt 170 lb (77.1 kg)   SpO2 98%   BMI 27.44 kg/m  General:   Well developed, well nourished sitting in a wheelchair, no distress.  HEENT:  Normocephalic . Face symmetric, atraumatic Lungs:  CTA B Normal respiratory effort, no intercostal retractions, no accessory muscle use. Heart: RRR,  no murmur.  No pretibial edema bilaterally  Skin: Not pale. Not jaundice Neurologic:  alert & oriented X3.  Gait not tested. Communicates by signaling and with a pad Psych--  Cooperative with normal attention span and concentration.  Behavior appropriate. No anxious or depressed appearing.      Assessment & Plan:   Assessment> HTN Hyperlipidemia Hypothyroidism Depression Osteoporosis- declined dexa 04-2014, again 07-2015 Back pain- oxycodone 2-3 qd  GERD PSP dx 2009 initially dx w/ Parkinson, dx changed to PSP 2010 Anemia: Low iron 08/06/2015, declined referral H/o Breast cancer 1996 H/o Melanoma 2006 H/o SOB: Negative cardiopulmonary stress test 2006, normal PFTs 2006  PLAN: HTN: cont Lasix, losartan, potassium. Check BMP Hypothyroidism: On Synthroid, check a TSH Depression: I asked about her moods, states that sometimes is depressed but does not feel she needs to change medication. Anemia, iron deficient, she saw my message regards anemia, not sure if she likes to proceed with a GI eval, recommend to let me know  if so desired. PSP:Saw neurology last month, was noted to have increased problems with talking, swallowing at baseline. Received a rx for oxycodone Flu shot today RTC 6 months.

## 2016-02-06 NOTE — Patient Instructions (Addendum)
GO TO THE LAB : Get the blood work     GO TO THE FRONT DESK Schedule your next appointment for a  routine checkup in 6 months  

## 2016-02-06 NOTE — Progress Notes (Signed)
Pre visit review using our clinic review tool, if applicable. No additional management support is needed unless otherwise documented below in the visit note. 

## 2016-02-07 NOTE — Assessment & Plan Note (Signed)
HTN: cont Lasix, losartan, potassium. Check BMP Hypothyroidism: On Synthroid, check a TSH Depression: I asked about her moods, states that sometimes is depressed but does not feel she needs to change medication. Anemia, iron deficient, she saw my message regards anemia, not sure if she likes to proceed with a GI eval, recommend to let me know  if so desired. PSP:Saw neurology last month, was noted to have increased problems with talking, swallowing at baseline. Received a rx for oxycodone Flu shot today RTC 6 months.

## 2016-02-11 ENCOUNTER — Other Ambulatory Visit: Payer: Self-pay | Admitting: Neurology

## 2016-02-11 NOTE — Telephone Encounter (Signed)
Last OV and rx given 12/24/15. Pt has f/u scheduled in Feb.

## 2016-02-11 NOTE — Telephone Encounter (Signed)
Patient requesting refill ofoxyCODONE (ROXICODONE) 15 MG immediate release tablet Pharmacy: pick up

## 2016-02-12 MED ORDER — OXYCODONE HCL 15 MG PO TABS: 15.0000 mg | ORAL_TABLET | Freq: Four times a day (QID) | ORAL | 0 refills | Status: DC | PRN

## 2016-02-12 NOTE — Telephone Encounter (Signed)
Rx printed, signed, up front for pick-up. 

## 2016-02-16 ENCOUNTER — Other Ambulatory Visit: Payer: Self-pay | Admitting: Internal Medicine

## 2016-03-15 ENCOUNTER — Other Ambulatory Visit: Payer: Self-pay | Admitting: Neurology

## 2016-03-15 MED ORDER — OXYCODONE HCL 15 MG PO TABS: 15.0000 mg | ORAL_TABLET | Freq: Four times a day (QID) | ORAL | 0 refills | Status: DC | PRN

## 2016-03-15 NOTE — Telephone Encounter (Signed)
Rx printed, signed, up front for pick-up. 

## 2016-03-15 NOTE — Telephone Encounter (Signed)
Patient's husband requesting refill of oxyCODONE (ROXICODONE) 15 MG immediate release tablet for the patient. I advised the Rx will be ready in 24 hours unless the nurse advises otherwise.

## 2016-03-15 NOTE — Addendum Note (Signed)
Addended by: Monte Fantasia on: 03/15/2016 12:44 PM   Modules accepted: Orders

## 2016-03-15 NOTE — Telephone Encounter (Signed)
Last OV was in August and f/u is scheduled in February. Most recent rx written 02/11/16.

## 2016-03-17 ENCOUNTER — Other Ambulatory Visit: Payer: Self-pay | Admitting: Internal Medicine

## 2016-03-22 IMAGING — DX DG CHEST 2V
2 series · 2 of 2 positions shown · non-contrast
Comparison: 05/15/2008

CLINICAL DATA: Cough and fever.  Evaluate for pneumonia

EXAM:
CHEST  2 VIEW

[chest pa]
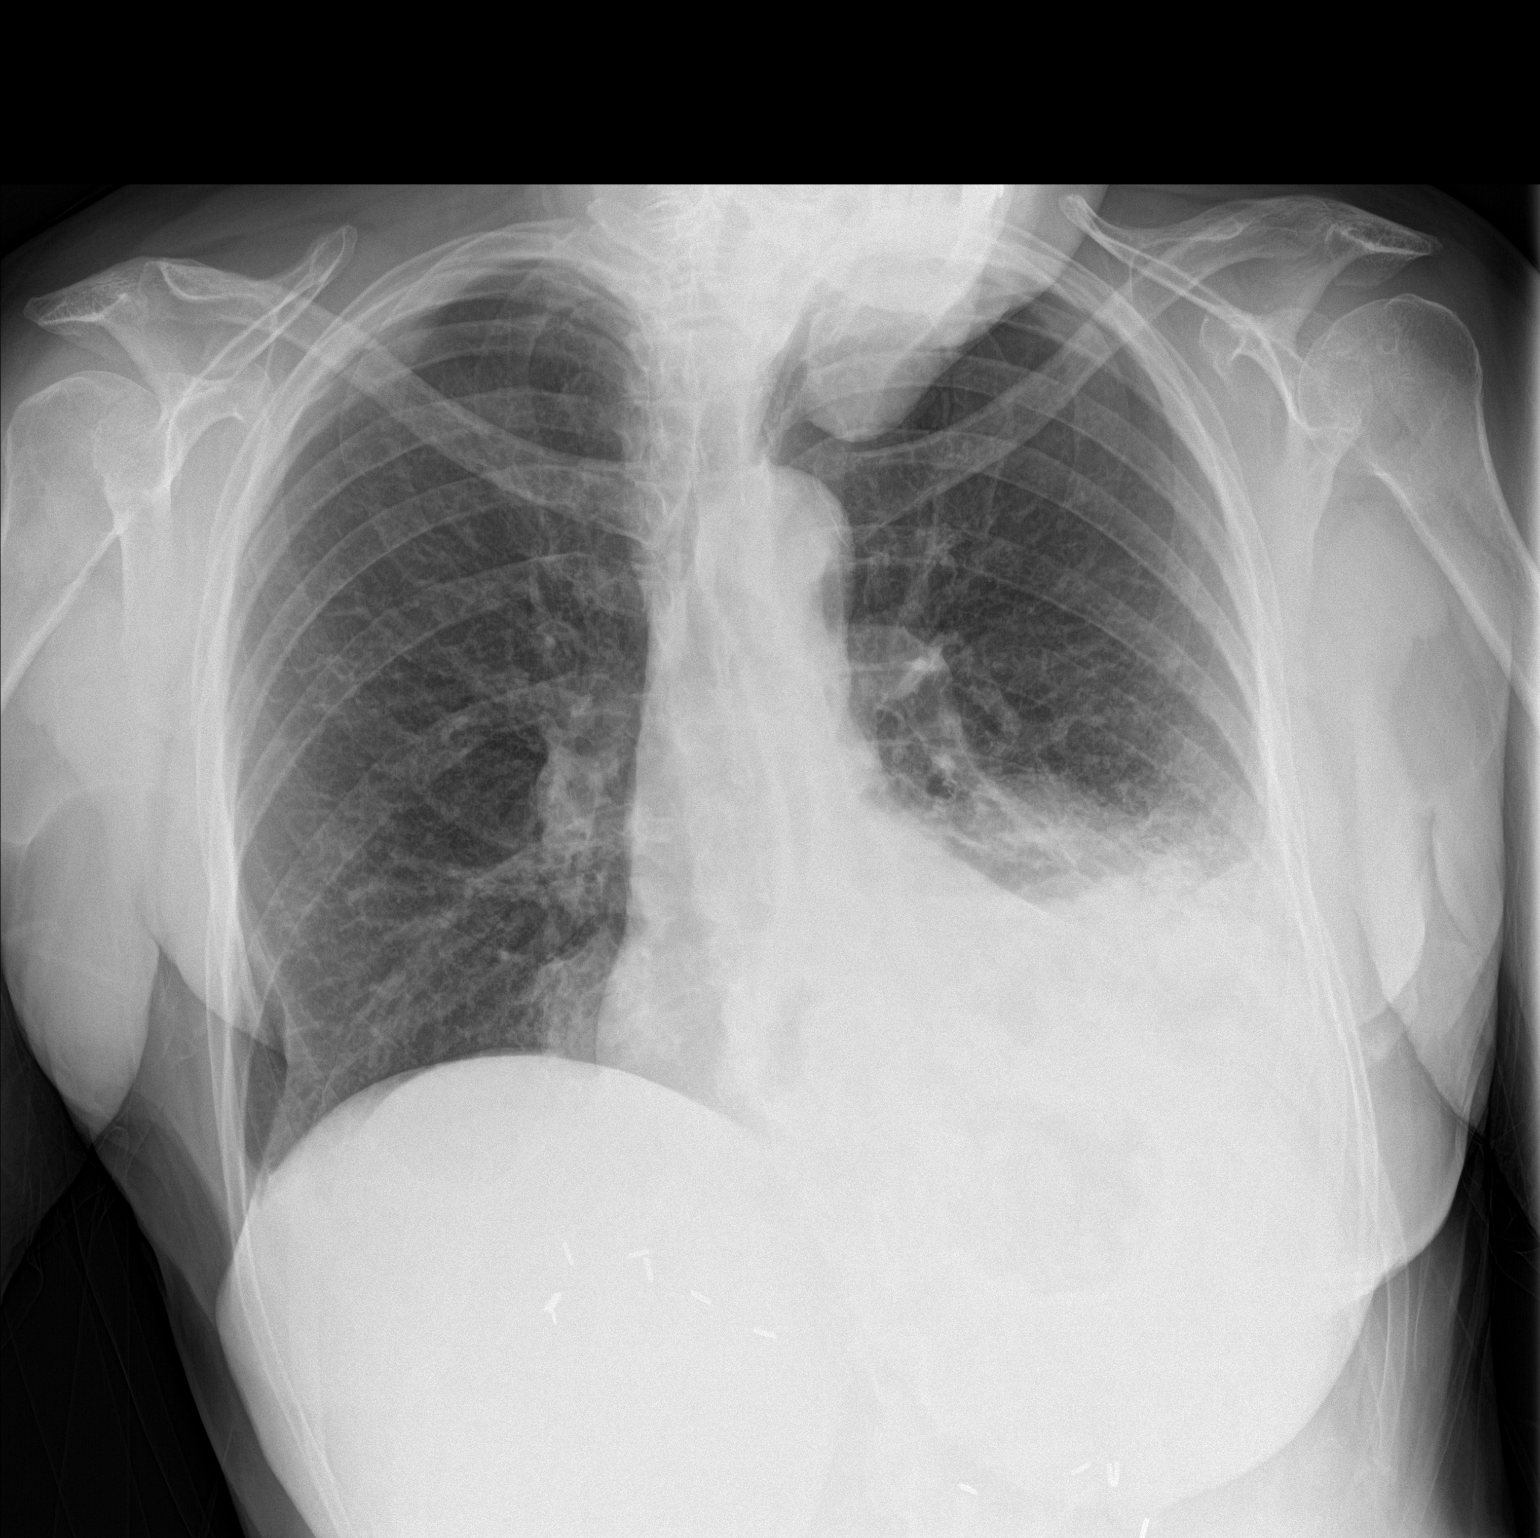

[chest lat]
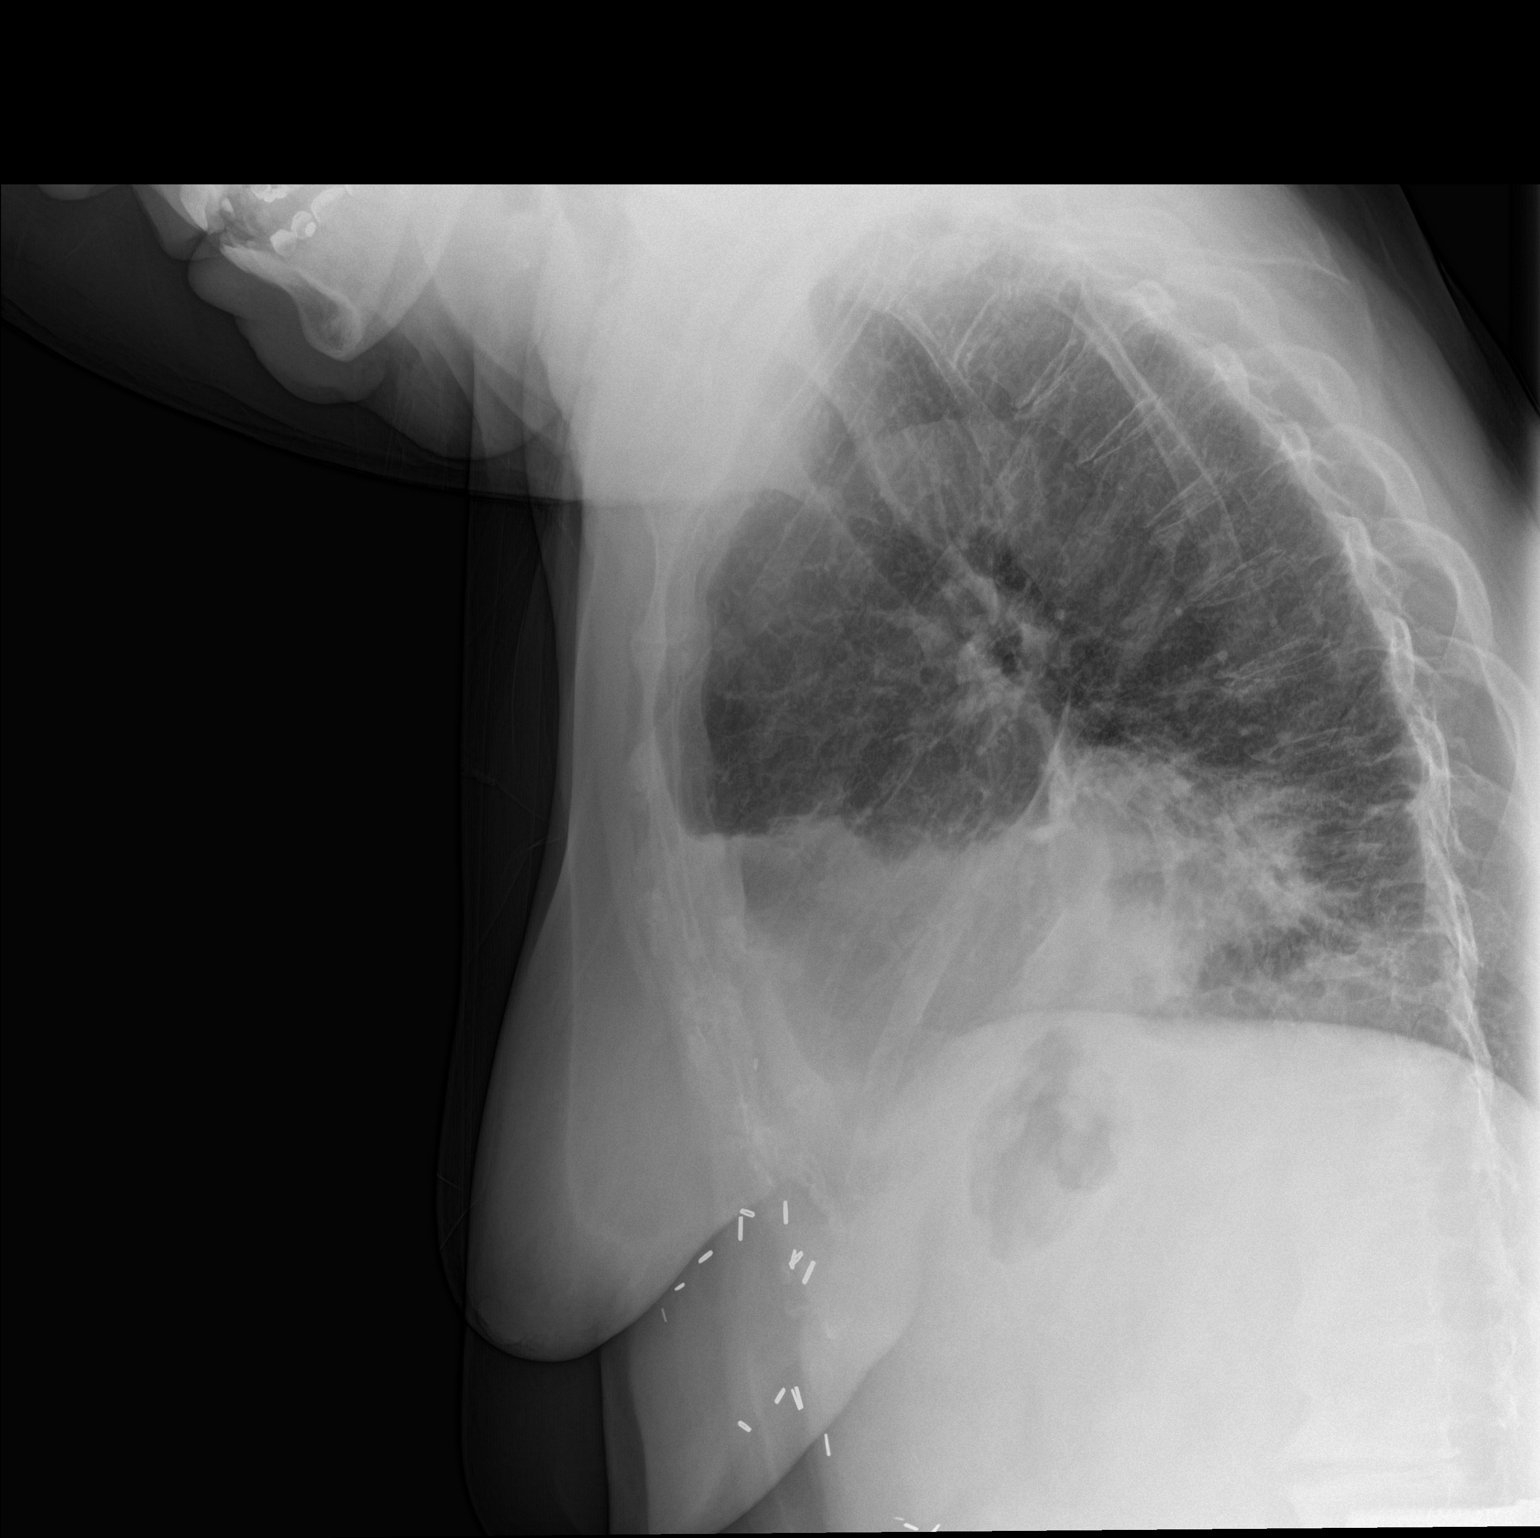

[2 of 2 positions shown; findings below may reference images not displayed]

FINDINGS: Dense airspace disease at the left base with pleural effusion. No
cavitary finding. There is likely also airspace disease at the
medial right base. No edema or air leak. Normal non obscured heart
size.
IMPRESSION: 1. Bibasilar pneumonia, more extensive on the left where there is
small pleural effusion.
2. Followup PA and lateral chest X-ray is recommended in 3-4 weeks
following trial of antibiotic therapy to ensure resolution and
exclude underlying malignancy.

## 2016-03-23 ENCOUNTER — Other Ambulatory Visit: Payer: Self-pay | Admitting: Internal Medicine

## 2016-04-12 ENCOUNTER — Other Ambulatory Visit: Payer: Self-pay | Admitting: Internal Medicine

## 2016-05-03 ENCOUNTER — Other Ambulatory Visit: Payer: Self-pay

## 2016-05-03 MED ORDER — LOSARTAN POTASSIUM 100 MG PO TABS: 100.0000 mg | ORAL_TABLET | Freq: Every day | ORAL | 2 refills | Status: DC

## 2016-05-24 IMAGING — DX DG CHEST 2V
2 series · 2 of 2 positions shown · non-contrast
Comparison: 04/11/2015

CLINICAL DATA: 69-year-old female for follow-up of pneumonia.

EXAM:
CHEST  2 VIEW

[chest pa]
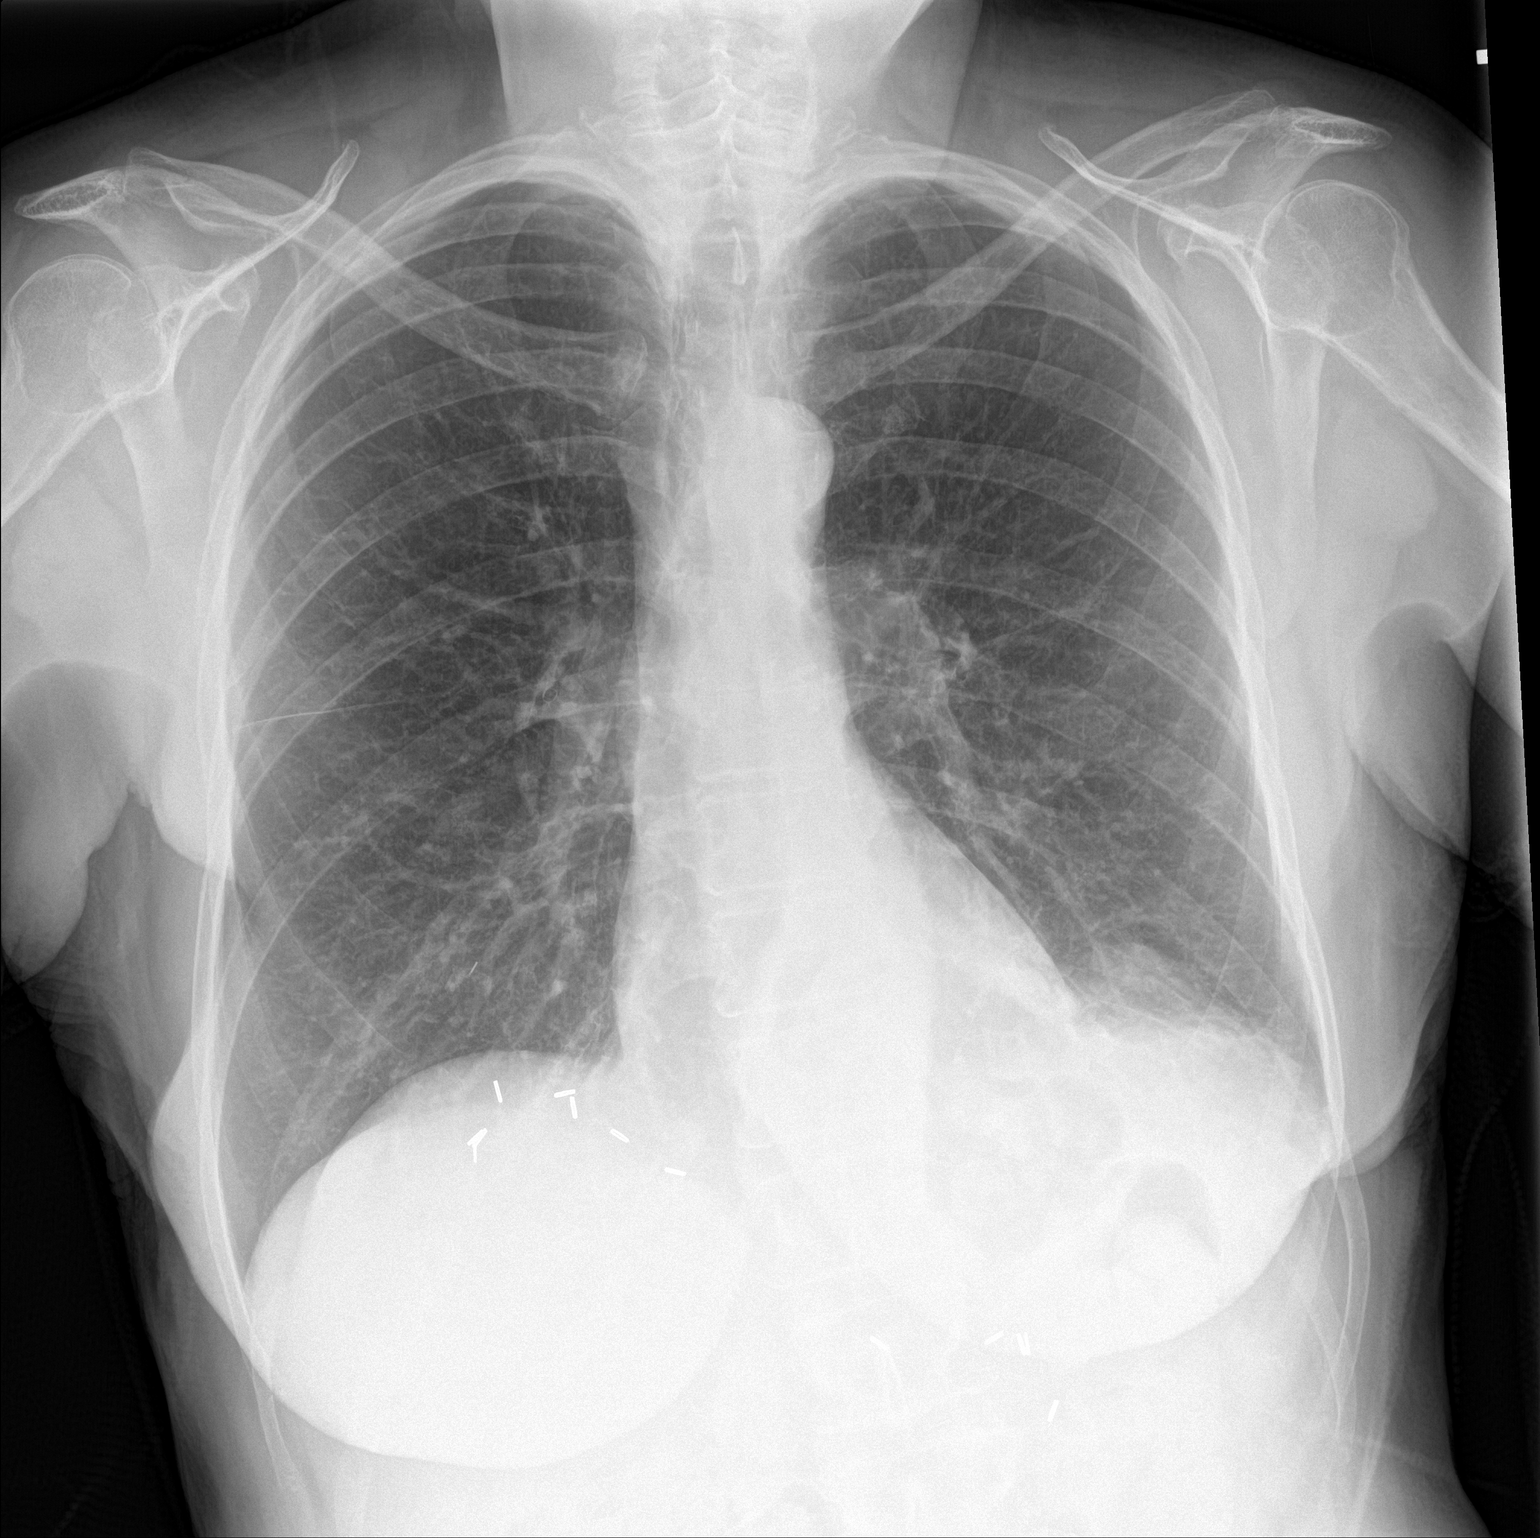

[chest lat]
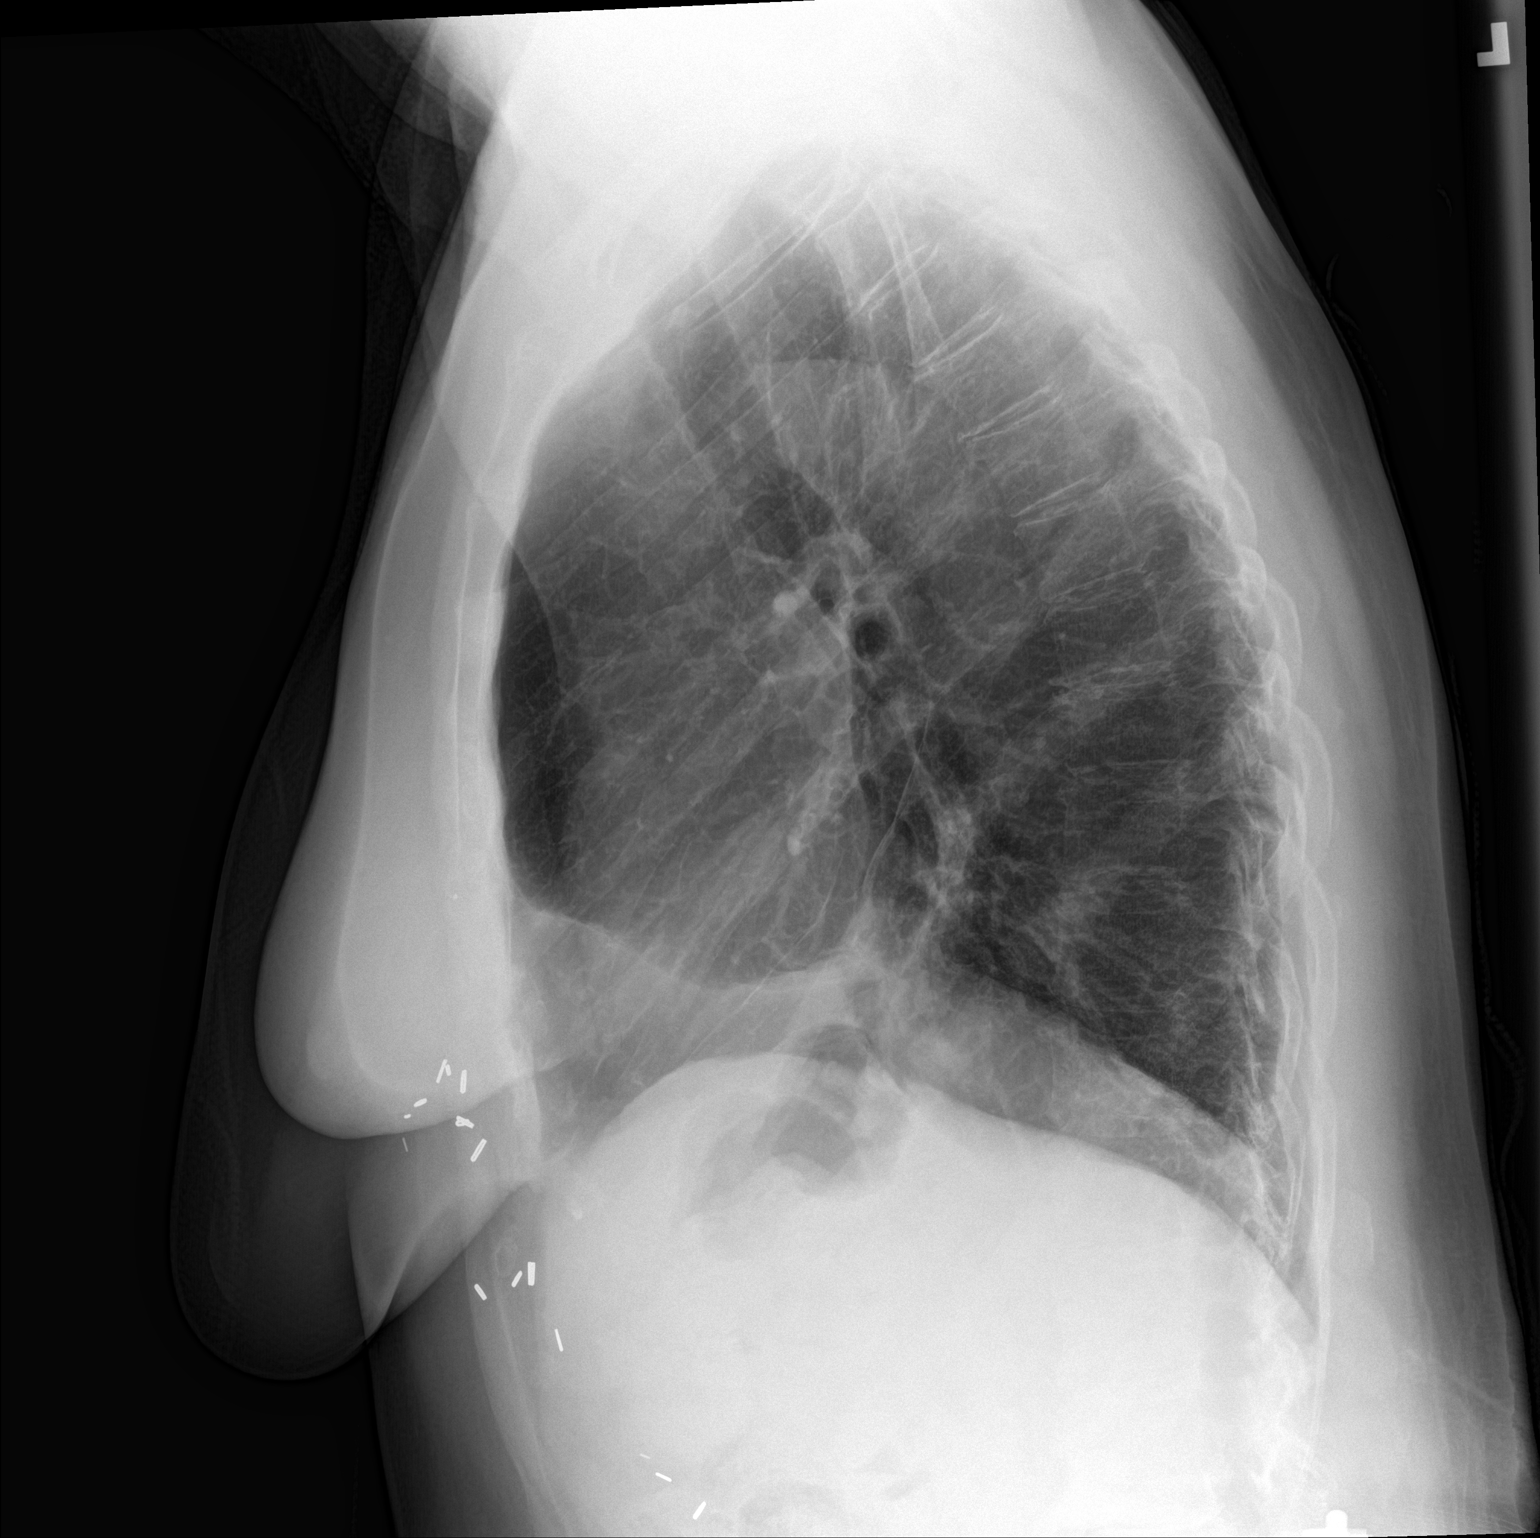

[2 of 2 positions shown; findings below may reference images not displayed]

FINDINGS: The cardiomediastinal silhouette is unremarkable.

Left lower lung opacities decreased from the prior study with
interval resolution of left pleural effusion.

There is no evidence of pneumothorax, new airspace disease or acute
bony abnormality.
IMPRESSION: Improved left lower lung aeration with mild residual
opacity/atelectasis. Resolved left pleural effusion.

## 2016-05-25 ENCOUNTER — Other Ambulatory Visit: Payer: Self-pay | Admitting: Neurology

## 2016-05-25 MED ORDER — OXYCODONE HCL 15 MG PO TABS: 15.0000 mg | ORAL_TABLET | Freq: Four times a day (QID) | ORAL | 0 refills | Status: DC | PRN

## 2016-05-25 NOTE — Telephone Encounter (Signed)
Pt had OV in Aug and has follow-up scheduled in Feb. Last rx written 03/15/16.

## 2016-05-25 NOTE — Addendum Note (Signed)
Addended by: Monte Fantasia on: 05/25/2016 03:39 PM   Modules accepted: Orders

## 2016-05-25 NOTE — Telephone Encounter (Signed)
Rx printed, signed, up front for pick-up. 

## 2016-05-25 NOTE — Telephone Encounter (Signed)
Pt request refill for oxyCODONE (ROXICODONE) 15 MG immediate release tablet

## 2016-06-30 ENCOUNTER — Telehealth: Payer: Self-pay | Admitting: Neurology

## 2016-06-30 MED ORDER — OXYCODONE HCL 15 MG PO TABS: 15.0000 mg | ORAL_TABLET | Freq: Four times a day (QID) | ORAL | 0 refills | Status: DC | PRN

## 2016-06-30 NOTE — Telephone Encounter (Signed)
Patients husband called requesting refill for oxyCODONE (ROXICODONE) 15 MG immediate release tablet.

## 2016-06-30 NOTE — Telephone Encounter (Signed)
The prescription for the oxycodone will be refilled. 

## 2016-06-30 NOTE — Telephone Encounter (Signed)
Called and LVM for pt advising rx ready to be picked up at front desk. Placed rx up front.

## 2016-06-30 NOTE — Telephone Encounter (Signed)
Dr Jannifer Franklin- ok to refill? She was last seen 12/24/15, next follow up with you is on 07/05/16.  Last given rx 05/25/16 qty 120, no refills

## 2016-06-30 NOTE — Addendum Note (Signed)
Addended by: Margette Fast on: 06/30/2016 04:20 PM   Modules accepted: Orders

## 2016-07-01 ENCOUNTER — Other Ambulatory Visit: Payer: Self-pay | Admitting: Internal Medicine

## 2016-07-05 ENCOUNTER — Ambulatory Visit (INDEPENDENT_AMBULATORY_CARE_PROVIDER_SITE_OTHER): Payer: Medicare Other | Admitting: Neurology

## 2016-07-05 ENCOUNTER — Encounter: Payer: Self-pay | Admitting: Neurology

## 2016-07-05 VITALS — BP 128/77 | HR 82 | Ht 66.0 in

## 2016-07-05 DIAGNOSIS — R05 Cough: Secondary | ICD-10-CM | POA: Diagnosis not present

## 2016-07-05 DIAGNOSIS — R269 Unspecified abnormalities of gait and mobility: Secondary | ICD-10-CM | POA: Diagnosis not present

## 2016-07-05 DIAGNOSIS — R059 Cough, unspecified: Secondary | ICD-10-CM

## 2016-07-05 DIAGNOSIS — G231 Progressive supranuclear ophthalmoplegia [Steele-Richardson-Olszewski]: Secondary | ICD-10-CM | POA: Diagnosis not present

## 2016-07-05 NOTE — Patient Instructions (Signed)
   We will check a CXR to evaluate the recent cough.

## 2016-07-05 NOTE — Progress Notes (Signed)
Reason for visit: Progressive supranuclear palsy  Emily Mccullough is an 71 y.o. female  History of present illness:  Emily Mccullough is a 71 year old right-handed white female with a history of progressive supranuclear palsy. She continues to progress slowly. Her speech is getting worse, as is her swallowing. The patient is coughing more than usual over the last 2 weeks. She has a history of pneumonia about a year ago, she is concerned that she has gotten the pneumonia again. She has generalized malaise as well. She denies any overt fevers. She is still able to read, she can stand for transfers, she does have back pain off and on. She claims that the oxycodone is effective for her pain. She has not had any recent falls. She returns to this office for an evaluation.  Past Medical History:  Diagnosis Date  . Abnormality of gait 06/25/2015  . Allergic rhinitis   . Breast CA ([place]) 1996  . Chronic back pain   . Colonic polyp   . Depression   . Dysphagia 06/25/2015  . GERD (gastroesophageal reflux disease)   . Headache(784.0)    migraines   . History of blood transfusion 2009   [facility]  . Hoarseness   . Hyperlipidemia   . Hypertension   . Hypothyroidism   . Melanoma ([place]) 2006   in situ nose,s/p mohs surgery  . Obese    Moderate  . Osteoporosis   . Postmenopausal status   . PSP (progressive supranuclear palsy) ([place]) 12/09   neuro Dr.Willis, initially dx as parkinson, dx changed to PSP in 2010  . Severe dysarthria 12/23/2014  . SOB (shortness of breath)    s/p a negative Cardio-pulmonary stress test 08-27-04, normal PFT s 2006      Past Surgical History:  Procedure Laterality Date  . CATARACT EXTRACTION     right  . fracture arm    . MASTECTOMY    . SPINAL FUSION  10/24/07   6 screws  . TOTAL ABDOMINAL HYSTERECTOMY W/ BILATERAL SALPINGOOPHORECTOMY  1990    Family History  Problem Relation Age of Onset  . Coronary artery disease Father     MI at age 60  . Emphysema Father    . Colon cancer Father     age  . Hypertension Father   . Diabetes Mother   . Hypertension Mother   . Stroke      GF  . Breast cancer Maternal Aunt     Social history:  reports that she has quit smoking. She has never used smokeless tobacco. She reports that she does not drink alcohol or use drugs.    Allergies  Allergen Reactions  . Zostavax [Zoster Vaccine Live]     Unable to take d/t Neosporin allergy  . Neosporin [Neomycin-Bacitracin Zn-Polymyx] Rash    Medications:  Prior to Admission medications   Medication Sig Start Date End Date Taking? Authorizing Provider  acetaminophen (TYLENOL) 500 MG tablet Take 500 mg by mouth every 6 (six) hours as needed for moderate pain.   Yes Historical Provider, MD  CALCIUM-VITAMIN D PO Take 1 tablet by mouth daily.    Yes Historical Provider, MD  fenofibrate (TRICOR) 48 MG tablet Take 1 tablet (48 mg total) by mouth at bedtime. 04/12/16  Yes Colon Branch, MD  ferrous sulfate 325 (65 FE) MG tablet Take 1 tablet (325 mg total) by mouth 2 (two) times daily with a meal. 03/23/16  Yes Colon Branch, MD  furosemide (LASIX)  20 MG tablet Take 2 tablets (40 mg total) by mouth daily. 07/01/16  Yes Colon Branch, MD  ibuprofen (ADVIL,MOTRIN) 200 MG tablet Take 600 mg by mouth every 6 (six) hours as needed for moderate pain.    Yes Historical Provider, MD  levothyroxine (SYNTHROID, LEVOTHROID) 50 MCG tablet Take 1 tablet (50 mcg total) by mouth daily before breakfast. 02/02/16  Yes Colon Branch, MD  losartan (COZAAR) 100 MG tablet Take 1 tablet (100 mg total) by mouth daily. 05/03/16  Yes Colon Branch, MD  Multiple Vitamin (MULTIVITAMIN) capsule Take 1 capsule by mouth daily.     Yes Historical Provider, MD  nortriptyline (PAMELOR) 25 MG capsule Take 1 capsule (25 mg total) by mouth at bedtime. 10/06/15  Yes Colon Branch, MD  oxyCODONE (ROXICODONE) 15 MG immediate release tablet Take 1 tablet (15 mg total) by mouth every 6 (six) hours as needed for pain (Must last 28 days).  06/30/16  Yes Kathrynn Ducking, MD  pantoprazole (PROTONIX) 40 MG tablet Take 1 tablet (40 mg total) by mouth 2 (two) times daily. 02/16/16  Yes Colon Branch, MD  potassium chloride SA (K-DUR,KLOR-CON) 20 MEQ tablet Take 2 tablets (40 mEq total) by mouth 2 (two) times daily. 01/20/16  Yes Colon Branch, MD    ROS:  Out of a complete 14 system review of symptoms, the patient complains only of the following symptoms, and all other reviewed systems are negative.  Fatigue Runny nose Blurred vision Cough  Blood pressure 128/77, pulse 82, height 5\' 6"  (1.676 m).  Physical Exam  General: The patient is alert and cooperative at the time of the examination.  Respiratory: Occasional wheezes are noted in the right lung fields posteriorly.   Cardiovascular: There is a regular rate and rhythm, no murmurs or rubs are noted.  Skin: No significant peripheral edema is noted.   Neurologic Exam  Mental status: The patient is alert and oriented x 3 at the time of the examination. The patient has apparent normal recent and remote memory, with an apparently normal attention span and concentration ability.   Cranial nerves: Facial symmetry is present. The patient has masking of the face, wide eyed stare. Speech is dysarthric.  Extraocular movements are full in the horizontal plane, the patient has difficulty with vertical gaze and inferior gaze. Visual fields are full.  Motor: The patient has good strength in all 4 extremities.  Sensory examination: Soft touch sensation is symmetric on the face, arms, and legs.  Coordination: The patient has good finger-nose-finger and heel-to-shin bilaterally.  Gait and station: The patient is wheelchair-bound, she was not ambulated.  Reflexes: Deep tendon reflexes are symmetric.   Assessment/Plan:  1. Progressive supranuclear palsy  2. Severe gait disorder  3. History of dysphagia  4. Chronic low back pain  The patient reports a recent onset of cough, we  will check a chest x-ray at this time. The patient is having some back pain issues, but this appears to be fairly well controlled with the oxycodone. She will follow-up in 6 months.  Jill Alexanders MD 07/05/2016 3:36 PM  [facility] [address] [place], [place] [address]  Phone [phone] Fax [phone]

## 2016-07-06 ENCOUNTER — Ambulatory Visit
Admission: RE | Admit: 2016-07-06 | Discharge: 2016-07-06 | Disposition: A | Discharge status: Home / Self Care | Payer: Medicare Other | Source: Ambulatory Visit | Attending: Neurology | Admitting: Neurology

## 2016-07-06 DIAGNOSIS — R05 Cough: Secondary | ICD-10-CM

## 2016-07-06 DIAGNOSIS — R059 Cough, unspecified: Secondary | ICD-10-CM

## 2016-07-06 DIAGNOSIS — R0602 Shortness of breath: Secondary | ICD-10-CM | POA: Diagnosis not present

## 2016-07-07 ENCOUNTER — Telehealth: Payer: Self-pay | Admitting: Neurology

## 2016-07-07 MED ORDER — LEVOFLOXACIN 750 MG PO TABS: 750.0000 mg | ORAL_TABLET | Freq: Every day | ORAL | 0 refills | Status: DC

## 2016-07-07 NOTE — Telephone Encounter (Signed)
I called patient. The chest x-ray questions an infiltrate or atelectasis in the left lower lobe. Given the fact the patient clinically has been coughing over the last 2 months, she has been at risk for pneumonia in the past, I will go ahead and treat with Levaquin.

## 2016-07-27 ENCOUNTER — Other Ambulatory Visit: Payer: Self-pay | Admitting: Internal Medicine

## 2016-08-03 ENCOUNTER — Telehealth: Payer: Self-pay | Admitting: Neurology

## 2016-08-03 MED ORDER — OXYCODONE HCL 15 MG PO TABS: 15.0000 mg | ORAL_TABLET | Freq: Four times a day (QID) | ORAL | 0 refills | Status: DC | PRN

## 2016-08-03 NOTE — Addendum Note (Signed)
Addended by: Kathrynn Ducking on: 08/03/2016 05:24 PM   Modules accepted: Orders

## 2016-08-03 NOTE — Telephone Encounter (Signed)
I will refill the oxycodone.

## 2016-08-03 NOTE — Telephone Encounter (Signed)
Pt husband called for a refill of oxyCODONE (ROXICODONE) 15 MG immediate release tablet

## 2016-08-04 NOTE — Telephone Encounter (Signed)
Placed printed/signed rx up front for pick up. 

## 2016-08-06 ENCOUNTER — Ambulatory Visit (INDEPENDENT_AMBULATORY_CARE_PROVIDER_SITE_OTHER): Payer: Medicare Other | Admitting: Internal Medicine

## 2016-08-06 ENCOUNTER — Encounter: Payer: Self-pay | Admitting: Internal Medicine

## 2016-08-06 VITALS — BP 116/76 | HR 75 | Temp 97.8°F | Resp 14 | Ht 66.0 in

## 2016-08-06 DIAGNOSIS — E034 Atrophy of thyroid (acquired): Secondary | ICD-10-CM | POA: Diagnosis not present

## 2016-08-06 DIAGNOSIS — G231 Progressive supranuclear ophthalmoplegia [Steele-Richardson-Olszewski]: Secondary | ICD-10-CM

## 2016-08-06 DIAGNOSIS — I10 Essential (primary) hypertension: Secondary | ICD-10-CM | POA: Diagnosis not present

## 2016-08-06 DIAGNOSIS — D649 Anemia, unspecified: Secondary | ICD-10-CM

## 2016-08-06 DIAGNOSIS — E785 Hyperlipidemia, unspecified: Secondary | ICD-10-CM

## 2016-08-06 LAB — CBC WITH DIFFERENTIAL/PLATELET
BASOS PCT: 0.4 % (ref 0.0–3.0)
Basophils Absolute: 0 10*3/uL (ref 0.0–0.1)
EOS PCT: 1.3 % (ref 0.0–5.0)
Eosinophils Absolute: 0.1 10*3/uL (ref 0.0–0.7)
HEMATOCRIT: 35.4 % — AB (ref 36.0–46.0)
HEMOGLOBIN: 11.8 g/dL — AB (ref 12.0–15.0)
Lymphocytes Relative: 20.9 % (ref 12.0–46.0)
Lymphs Abs: 2.3 10*3/uL (ref 0.7–4.0)
MCHC: 33.4 g/dL (ref 30.0–36.0)
MCV: 88 fl (ref 78.0–100.0)
MONOS PCT: 7.2 % (ref 3.0–12.0)
Monocytes Absolute: 0.8 10*3/uL (ref 0.1–1.0)
Neutro Abs: 7.7 10*3/uL (ref 1.4–7.7)
Neutrophils Relative %: 70.2 % (ref 43.0–77.0)
Platelets: 271 10*3/uL (ref 150.0–400.0)
RBC: 4.03 Mil/uL (ref 3.87–5.11)
RDW: 14.2 % (ref 11.5–15.5)
WBC: 10.9 10*3/uL — AB (ref 4.0–10.5)

## 2016-08-06 LAB — BASIC METABOLIC PANEL
BUN: 15 mg/dL (ref 6–23)
CHLORIDE: 106 meq/L (ref 96–112)
CO2: 26 mEq/L (ref 19–32)
Calcium: 9.4 mg/dL (ref 8.4–10.5)
Creatinine, Ser: 0.6 mg/dL (ref 0.40–1.20)
GFR: 104.56 mL/min (ref >60.00)
Glucose, Bld: 75 mg/dL (ref 70–99)
POTASSIUM: 3.5 meq/L (ref 3.5–5.1)
SODIUM: 140 meq/L (ref 135–145)

## 2016-08-06 LAB — FERRITIN: Ferritin: 69.6 ng/mL (ref 10.0–291.0)

## 2016-08-06 LAB — IRON: Iron: 46 ug/dL (ref 42–145)

## 2016-08-06 NOTE — Progress Notes (Signed)
Pre visit review using our clinic review tool, if applicable. No additional management support is needed unless otherwise documented below in the visit note. 

## 2016-08-06 NOTE — Patient Instructions (Signed)
GO TO THE LAB : Get the blood work    GO TO THE FRONT DESK Schedule your next appointment for a  checkup in 6 months  Please allow your husband to do percussion therapy on your chest for few minutes  twice a day

## 2016-08-06 NOTE — Progress Notes (Signed)
Subjective:    Patient ID: Emily Mccullough, female    DOB: 07-Oct-1945, 71 y.o.   MRN: [id number]  DOS:  08/06/2016 Type of visit - description : Routine checkup, here with her husband Interval history: Medication list reviewed, good compliance. Labs reviewed, she's due for a few labs today. Hypertension: No recent ambulatory BPs. The pt's husband reports that in the afternoon she has some chest congestion described as "gargling". When asked about cough they both say she has some.   Review of Systems No fever chills Minimal edema lower extremities, at baseline No abdominal pain, blood in the stools. BMs appear normal. Appetite is normal.   Past Medical History:  Diagnosis Date  . Abnormality of gait 06/25/2015  . Allergic rhinitis   . Breast CA ([place]) 1996  . Chronic back pain   . Colonic polyp   . Depression   . Dysphagia 06/25/2015  . GERD (gastroesophageal reflux disease)   . Headache(784.0)    migraines   . History of blood transfusion 2009   [facility]  . Hoarseness   . Hyperlipidemia   . Hypertension   . Hypothyroidism   . Melanoma ([place]) 2006   in situ nose,s/p mohs surgery  . Obese    Moderate  . Osteoporosis   . Postmenopausal status   . PSP (progressive supranuclear palsy) ([place]) 12/09   neuro Dr.Willis, initially dx as parkinson, dx changed to PSP in 2010  . Severe dysarthria 12/23/2014  . SOB (shortness of breath)    s/p a negative Cardio-pulmonary stress test 08-27-04, normal PFT s 2006      Past Surgical History:  Procedure Laterality Date  . CATARACT EXTRACTION     right  . fracture arm    . MASTECTOMY    . SPINAL FUSION  10/24/07   6 screws  . TOTAL ABDOMINAL HYSTERECTOMY W/ BILATERAL SALPINGOOPHORECTOMY  1990    Social History   Social History  . Marital status: Married    Spouse name: N/A  . Number of children: 0  . Years of education: N/A   Occupational History  . disable Unemployed   Social History Main Topics  . Smoking status:  Former Research scientist (life sciences)  . Smokeless tobacco: Never Used  . Alcohol use No  . Drug use: No  . Sexual activity: No   Other Topics Concern  . Not on file   Social History Narrative   Household-- pt and husband   Patient is right handed   Patient does not drink caffeine.      Allergies as of 08/06/2016      Reactions   Zostavax [zoster Vaccine Live]    Unable to take d/t Neosporin allergy   Neosporin [neomycin-bacitracin Zn-polymyx] Rash      Medication List       Accurate as of 08/06/16 11:59 PM. Always use your most recent med list.          acetaminophen 500 MG tablet Commonly known as:  TYLENOL Take 500 mg by mouth every 6 (six) hours as needed for moderate pain.   CALCIUM-VITAMIN D PO Take 1 tablet by mouth daily.   fenofibrate 48 MG tablet Commonly known as:  TRICOR Take 1 tablet (48 mg total) by mouth at bedtime.   ferrous sulfate 325 (65 FE) MG tablet Take 1 tablet (325 mg total) by mouth 2 (two) times daily with a meal.   furosemide 20 MG tablet Commonly known as:  LASIX Take 2 tablets (40 mg total)  by mouth daily.   ibuprofen 200 MG tablet Commonly known as:  ADVIL,MOTRIN Take 600 mg by mouth every 6 (six) hours as needed for moderate pain.   levothyroxine 50 MCG tablet Commonly known as:  SYNTHROID, LEVOTHROID TAKE 1 TABLET(50 MCG) BY MOUTH DAILY BEFORE BREAKFAST   losartan 100 MG tablet Commonly known as:  COZAAR Take 1 tablet (100 mg total) by mouth daily.   multivitamin capsule Take 1 capsule by mouth daily.   nortriptyline 25 MG capsule Commonly known as:  PAMELOR Take 1 capsule (25 mg total) by mouth at bedtime.   oxyCODONE 15 MG immediate release tablet Commonly known as:  ROXICODONE Take 1 tablet (15 mg total) by mouth every 6 (six) hours as needed for pain (Must last 28 days).   pantoprazole 40 MG tablet Commonly known as:  PROTONIX Take 1 tablet (40 mg total) by mouth 2 (two) times daily.   potassium chloride SA 20 MEQ tablet Commonly  known as:  K-DUR,KLOR-CON Take 2 tablets (40 mEq total) by mouth 2 (two) times daily.          Objective:   Physical Exam BP 116/76 (BP Location: Left Arm, Patient Position: Sitting, Cuff Size: Small)   Pulse 75   Temp 97.8 F (36.6 C) (Oral)   Resp 14   Ht 5\' 6"  (1.676 m)   SpO2 91%  General:   Well developed, well nourished. Sitting in a wheelchair in no distress  HEENT:  Normocephalic . Face symmetric, atraumatic Lungs:  Mild large airway congestion, clear with cough. No wheezing. Normal respiratory effort, no intercostal retractions, no accessory muscle use. Heart: RRR,  no murmur.  Trace pretibial edema bilaterally  Skin: Not pale. Not jaundice Neurologic:  alert & oriented X3. Speech is very limited, she follows commands without problems. Psych--  Behavior appropriate. No anxious or depressed appearing.      Assessment & Plan:  Assessment HTN Hyperlipidemia Hypothyroidism Depression Osteoporosis- declined dexa 04-2014, again 07-2015 Back pain- oxycodone 2-3 qd  GERD PSP dx 2009 initially dx w/ Parkinson, dx changed to PSP 2010 Anemia: Low iron 08/06/2015, declined referral H/o Breast cancer 1996 H/o Melanoma 2006 H/o SOB: Negative cardiopulmonary stress test 2006, normal PFTs 2006  PLAN: HTN: Seems well-controlled with Lasix, losartan, potassium. Check a BMP Hyperlipidemia: On TriCor, last FLP satisfactory Hypothyroidism: Continue Synthroid, last TSH satisfactory  Anemia, iron deficiency: We will recheck labs today, she is taking iron. See previous entries, I again told the patient the standard of care will be further GI evaluation, if she changes her mind and is willing to proceed, she knows to let me know. PSP: She seems stable, she does have some large airway congestion, chest percussion therapy twice a day discussed . Declined medicare wellness  RTC 6 months   Today, I spent more than  25  min with the patient: >50% of the time counseling regards  further eval of anemia, percussion chest therapy technique

## 2016-08-08 NOTE — Assessment & Plan Note (Signed)
HTN: Seems well-controlled with Lasix, losartan, potassium. Check a BMP Hyperlipidemia: On TriCor, last FLP satisfactory Hypothyroidism: Continue Synthroid, last TSH satisfactory  Anemia, iron deficiency: We will recheck labs today, she is taking iron. See previous entries, I again told the patient the standard of care will be further GI evaluation, if she changes her mind and is willing to proceed, she knows to let me know. PSP: She seems stable, she does have some large airway congestion, chest percussion therapy twice a day discussed . Declined medicare wellness  RTC 6 months

## 2016-09-07 ENCOUNTER — Telehealth: Payer: Self-pay | Admitting: Neurology

## 2016-09-07 MED ORDER — OXYCODONE HCL 15 MG PO TABS: 15.0000 mg | ORAL_TABLET | Freq: Four times a day (QID) | ORAL | 0 refills | Status: DC | PRN

## 2016-09-07 NOTE — Telephone Encounter (Signed)
The oxycodone was refilled 

## 2016-09-07 NOTE — Telephone Encounter (Signed)
Patient called office requesting refill for oxyCODONE (ROXICODONE) 15 MG immediate release tablet.

## 2016-09-07 NOTE — Addendum Note (Signed)
Addended by: Kathrynn Ducking on: 09/07/2016 02:11 PM   Modules accepted: Orders

## 2016-09-07 NOTE — Telephone Encounter (Signed)
Placed printed/signed rx up front for patient pick up.  

## 2016-10-21 ENCOUNTER — Other Ambulatory Visit: Payer: Self-pay | Admitting: Internal Medicine

## 2016-11-08 ENCOUNTER — Telehealth: Payer: Self-pay | Admitting: Neurology

## 2016-11-08 MED ORDER — OXYCODONE HCL 15 MG PO TABS: 15.0000 mg | ORAL_TABLET | Freq: Four times a day (QID) | ORAL | 0 refills | Status: DC | PRN

## 2016-11-08 NOTE — Telephone Encounter (Signed)
The oxycodone will be refilled. 

## 2016-11-08 NOTE — Telephone Encounter (Signed)
Placed printed/signed rx oxycodone up front for patient pick up.  

## 2016-11-08 NOTE — Telephone Encounter (Signed)
Pt calling for refill of oxyCODONE (ROXICODONE) 15 MG immediate release tablet

## 2016-11-08 NOTE — Addendum Note (Signed)
Addended by: Kathrynn Ducking on: 11/08/2016 01:32 PM   Modules accepted: Orders

## 2016-12-19 ENCOUNTER — Other Ambulatory Visit: Payer: Self-pay | Admitting: Internal Medicine

## 2016-12-20 ENCOUNTER — Telehealth: Payer: Self-pay | Admitting: Neurology

## 2016-12-20 MED ORDER — OXYCODONE HCL 15 MG PO TABS: 15.0000 mg | ORAL_TABLET | Freq: Four times a day (QID) | ORAL | 0 refills | Status: DC | PRN

## 2016-12-20 NOTE — Telephone Encounter (Signed)
I will renew the oxycodone.

## 2016-12-20 NOTE — Addendum Note (Signed)
Addended by: Kathrynn Ducking on: 12/20/2016 01:21 PM   Modules accepted: Orders

## 2016-12-20 NOTE — Telephone Encounter (Signed)
Placed printed/signed rx up front for patient pick up.  

## 2016-12-20 NOTE — Telephone Encounter (Signed)
Pt's husband (not on DPR) requested refill foroxyCODONE (ROXICODONE) 15 MG immediate release tablet .

## 2016-12-24 ENCOUNTER — Telehealth: Payer: Self-pay | Admitting: Internal Medicine

## 2016-12-24 NOTE — Telephone Encounter (Signed)
Spoke w/ [employer], informed that Pt should contact Dr. Jannifer Franklin (neuro) for paperwork completion.

## 2016-12-24 NOTE — Telephone Encounter (Signed)
CICI from [employer] called in to follow up on chair repair request for pt. She said that a form was faxed over on 12/16/16.    CB: [phone]  Ref# [id number]

## 2016-12-24 NOTE — Telephone Encounter (Signed)
As per CMA skpe patient scheduled for Tuesday 02/27/17 with PCP.    Emily Mccullough via [employer], so if they want [place] to fill out the paperwork we will need to see her, we haven't seen her since 07/2016, and her next appt isn't until next month, w/ medicare you have to be seen w/in 90 days  of signature

## 2016-12-28 ENCOUNTER — Telehealth: Payer: Self-pay | Admitting: Internal Medicine

## 2016-12-28 ENCOUNTER — Ambulatory Visit: Payer: Medicare Other | Admitting: Internal Medicine

## 2016-12-28 NOTE — Telephone Encounter (Signed)
No charge. 

## 2016-12-28 NOTE — Telephone Encounter (Signed)
Spouse called stating patient fell on the way to appointment and EMS had to be called, spouse rescheduled appointment for 12/29/16 with PCP, charge or no charge

## 2016-12-29 ENCOUNTER — Ambulatory Visit (INDEPENDENT_AMBULATORY_CARE_PROVIDER_SITE_OTHER): Payer: Medicare Other | Admitting: Internal Medicine

## 2016-12-29 ENCOUNTER — Telehealth: Payer: Self-pay | Admitting: *Deleted

## 2016-12-29 ENCOUNTER — Encounter: Payer: Self-pay | Admitting: Internal Medicine

## 2016-12-29 VITALS — BP 128/70 | HR 75 | Resp 14 | Ht 66.0 in

## 2016-12-29 DIAGNOSIS — G231 Progressive supranuclear ophthalmoplegia [Steele-Richardson-Olszewski]: Secondary | ICD-10-CM | POA: Diagnosis not present

## 2016-12-29 NOTE — Patient Instructions (Signed)
See you next month, call if the pain in the back resurface.

## 2016-12-29 NOTE — Progress Notes (Signed)
Subjective:    Patient ID: Emily Mccullough, female    DOB: 1945/12/29, 71 y.o.   MRN: [id number]  DOS:  12/29/2016 Type of visit - description : acute Interval history: Needs documentation regards her power mobility device. She has been using power device for long time. She has PSP and is unable to mobilize due to inability to sustain herself and coordinate her gait. Also, yesterday had a near fall, the patient was transferring from the car to a wheelchair and the husband prevent the fall, she slide down to the floor, EMS arrived, did not go to the ER. Had some pain in the lower back but no pain today.   Review of Systems   Past Medical History:  Diagnosis Date  . Abnormality of gait 06/25/2015  . Allergic rhinitis   . Breast CA ([place]) 1996  . Chronic back pain   . Colonic polyp   . Depression   . Dysphagia 06/25/2015  . GERD (gastroesophageal reflux disease)   . Headache(784.0)    migraines   . History of blood transfusion 2009   [facility]  . Hoarseness   . Hyperlipidemia   . Hypertension   . Hypothyroidism   . Melanoma ([place]) 2006   in situ nose,s/p mohs surgery  . Obese    Moderate  . Osteoporosis   . Postmenopausal status   . PSP (progressive supranuclear palsy) ([place]) 12/09   neuro Dr.Willis, initially dx as parkinson, dx changed to PSP in 2010  . Severe dysarthria 12/23/2014  . SOB (shortness of breath)    s/p a negative Cardio-pulmonary stress test 08-27-04, normal PFT s 2006      Past Surgical History:  Procedure Laterality Date  . CATARACT EXTRACTION     right  . fracture arm    . MASTECTOMY    . SPINAL FUSION  10/24/07   6 screws  . TOTAL ABDOMINAL HYSTERECTOMY W/ BILATERAL SALPINGOOPHORECTOMY  1990    Social History   Social History  . Marital status: Married    Spouse name: N/A  . Number of children: 0  . Years of education: N/A   Occupational History  . disable Unemployed   Social History Main Topics  . Smoking status: Former Research scientist (life sciences)  .  Smokeless tobacco: Never Used  . Alcohol use No  . Drug use: No  . Sexual activity: No   Other Topics Concern  . Not on file   Social History Narrative   Household-- pt and husband   Patient is right handed   Patient does not drink caffeine.      Allergies as of 12/29/2016      Reactions   Zostavax [zoster Vaccine Live]    Unable to take d/t Neosporin allergy   Neosporin [neomycin-bacitracin Zn-polymyx] Rash      Medication List       Accurate as of 12/29/16 11:59 PM. Always use your most recent med list.          acetaminophen 500 MG tablet Commonly known as:  TYLENOL Take 500 mg by mouth every 6 (six) hours as needed for moderate pain.   CALCIUM-VITAMIN D PO Take 1 tablet by mouth daily.   fenofibrate 48 MG tablet Commonly known as:  TRICOR Take 1 tablet (48 mg total) by mouth at bedtime.   ferrous sulfate 325 (65 FE) MG tablet Take 1 tablet (325 mg total) by mouth 2 (two) times daily with a meal.   furosemide 20 MG tablet Commonly known  as:  LASIX Take 2 tablets (40 mg total) by mouth daily.   ibuprofen 200 MG tablet Commonly known as:  ADVIL,MOTRIN Take 600 mg by mouth every 6 (six) hours as needed for moderate pain.   levothyroxine 50 MCG tablet Commonly known as:  SYNTHROID, LEVOTHROID Take 1 tablet (50 mcg total) by mouth daily before breakfast.   losartan 100 MG tablet Commonly known as:  COZAAR Take 1 tablet (100 mg total) by mouth daily.   multivitamin capsule Take 1 capsule by mouth daily.   nortriptyline 25 MG capsule Commonly known as:  PAMELOR Take 1 capsule (25 mg total) by mouth at bedtime.   oxyCODONE 15 MG immediate release tablet Commonly known as:  ROXICODONE Take 1 tablet (15 mg total) by mouth every 6 (six) hours as needed for pain (Must last 28 days).   pantoprazole 40 MG tablet Commonly known as:  PROTONIX Take 1 tablet (40 mg total) by mouth 2 (two) times daily.   potassium chloride SA 20 MEQ tablet Commonly known as:   K-DUR,KLOR-CON Take 2 tablets (40 mEq total) by mouth 2 (two) times daily.          Objective:   Physical Exam BP 128/70 (BP Location: Left Arm, Patient Position: Sitting, Cuff Size: Small)   Pulse 75   Resp 14   Ht 5\' 6"  (1.676 m)   SpO2 96%  General:   Well developed, well nourished, sitting in a wheelchair in no particular distress HEENT:  Normocephalic . Face symmetric, atraumatic Skin: Not pale. Not jaundice Neurologic:  alert & oriented X3.  Masking face, slow movements. Speech is extremely slow, gait not tested.  Psych--  No anxious or depressed appearing.      Assessment & Plan:   Assessment HTN Hyperlipidemia Hypothyroidism Depression Osteoporosis- declined dexa 04-2014, again 07-2015 Back pain- oxycodone 2-3 qd  GERD PSP dx 2009 initially dx w/ Parkinson, dx changed to PSP 2010 Anemia: Low iron 08/06/2015, declined referral H/o Breast cancer 1996 H/o Melanoma 2006 H/o SOB: Negative cardiopulmonary stress test 2006, normal PFTs 2006  PLAN: PSP: The patient remains wheelchair bound, using a [employer] every day, unable to mobilize without it. High risk of falls without assistance.  Fall: see HPI, no apparent consequence.

## 2016-12-29 NOTE — Telephone Encounter (Signed)
OV note from today faxed to [facility] [phone].

## 2016-12-29 NOTE — Telephone Encounter (Signed)
Received new [employer] fax requesting office note for repair order to be completed; pt No show appointment on 12/28/16  To have this completed, and has r/s for today; paperwork forwarded to provider's CMA, Kaylyn/SLS 08/15

## 2016-12-29 NOTE — Progress Notes (Signed)
Pre visit review using our clinic review tool, if applicable. No additional management support is needed unless otherwise documented below in the visit note. 

## 2016-12-30 NOTE — Assessment & Plan Note (Signed)
PSP: The patient remains wheelchair bound, using a Hover[employer] every day, unable to mobilize without it. High risk of falls without assistance.  Fall: see HPI, no apparent consequence.

## 2016-12-31 ENCOUNTER — Telehealth: Payer: Self-pay | Admitting: Internal Medicine

## 2016-12-31 NOTE — Telephone Encounter (Signed)
Called pt to schedule awv. Lvm for pt to call office to schedule appt. Pt can schedule awv after 08/05/2016.

## 2017-01-20 ENCOUNTER — Ambulatory Visit: Payer: Medicare Other | Admitting: Neurology

## 2017-01-21 ENCOUNTER — Other Ambulatory Visit: Payer: Self-pay | Admitting: Internal Medicine

## 2017-02-02 ENCOUNTER — Telehealth: Payer: Self-pay | Admitting: Neurology

## 2017-02-02 NOTE — Telephone Encounter (Signed)
Patient's husband requesting refill of  oxyCODONE (ROXICODONE) 15 MG immediate release tablet for the patient.

## 2017-02-03 MED ORDER — OXYCODONE HCL 15 MG PO TABS: 15.0000 mg | ORAL_TABLET | Freq: Four times a day (QID) | ORAL | 0 refills | Status: DC | PRN

## 2017-02-03 NOTE — Telephone Encounter (Signed)
Placed printed/signed rx up front for patient pick up.  

## 2017-02-03 NOTE — Addendum Note (Signed)
Addended by: Kathrynn Ducking on: 02/03/2017 07:25 AM   Modules accepted: Orders

## 2017-02-03 NOTE — Telephone Encounter (Signed)
The oxycodone Rx was refilled

## 2017-02-08 ENCOUNTER — Encounter: Payer: Self-pay | Admitting: Internal Medicine

## 2017-02-08 ENCOUNTER — Ambulatory Visit (INDEPENDENT_AMBULATORY_CARE_PROVIDER_SITE_OTHER): Payer: Medicare Other | Admitting: Internal Medicine

## 2017-02-08 ENCOUNTER — Ambulatory Visit (HOSPITAL_BASED_OUTPATIENT_CLINIC_OR_DEPARTMENT_OTHER)
Admission: RE | Admit: 2017-02-08 | Discharge: 2017-02-08 | Disposition: A | Discharge status: Home / Self Care | Payer: Medicare Other | Source: Ambulatory Visit | Attending: Internal Medicine | Admitting: Internal Medicine

## 2017-02-08 VITALS — BP 118/70 | HR 78 | Temp 98.4°F | Resp 16 | Ht 66.0 in

## 2017-02-08 DIAGNOSIS — E034 Atrophy of thyroid (acquired): Secondary | ICD-10-CM | POA: Diagnosis not present

## 2017-02-08 DIAGNOSIS — Z23 Encounter for immunization: Secondary | ICD-10-CM

## 2017-02-08 DIAGNOSIS — R627 Adult failure to thrive: Secondary | ICD-10-CM

## 2017-02-08 DIAGNOSIS — D649 Anemia, unspecified: Secondary | ICD-10-CM | POA: Diagnosis not present

## 2017-02-08 DIAGNOSIS — R05 Cough: Secondary | ICD-10-CM | POA: Insufficient documentation

## 2017-02-08 DIAGNOSIS — K449 Diaphragmatic hernia without obstruction or gangrene: Secondary | ICD-10-CM | POA: Diagnosis not present

## 2017-02-08 DIAGNOSIS — R059 Cough, unspecified: Secondary | ICD-10-CM

## 2017-02-08 DIAGNOSIS — I10 Essential (primary) hypertension: Secondary | ICD-10-CM

## 2017-02-08 NOTE — Progress Notes (Signed)
Pre visit review using our clinic review tool, if applicable. No additional management support is needed unless otherwise documented below in the visit note. 

## 2017-02-08 NOTE — Patient Instructions (Signed)
GO TO THE LAB : Get the blood work     GO TO THE FRONT DESK Schedule your next appointment for a  checkup in 3 months     STOP BY THE FIRST FLOOR:  get the XR    Think about palliative care

## 2017-02-08 NOTE — Progress Notes (Signed)
 Subjective:    Patient ID: Emily Mccullough, female    DOB: 09/29/1945, 71 y.o.   MRN: [id number]  DOS:  02/08/2017 Type of visit - description : f/u Interval history: Here with her husband, most of the communication and information is obtained from him. Overall her stamina has decreased over time, she is more sleepy than usual, weaker. Needs more help when she transfers. HTN: Good medication compliance, no ambulatory BPs Hypothyroidism: On Synthroid, good compliance.   Review of Systems No fever chills Appetite is good No nausea, vomiting, diarrhea. The patient does deny abdominal pain. Mild cough on and off, not a new issue, no sputum production but + large airway congestion noted by the patient's husband.  Past Medical History:  Diagnosis Date  . Abnormality of gait 06/25/2015  . Allergic rhinitis   . Breast CA ([facility]) 1996  . Chronic back pain   . Colonic polyp   . Depression   . Dysphagia 06/25/2015  . GERD (gastroesophageal reflux disease)   . Headache(784.0)    migraines   . History of blood transfusion 2009   [facility]  . Hoarseness   . Hyperlipidemia   . Hypertension   . Hypothyroidism   . Melanoma ([facility]) 2006   in situ nose,s/p mohs surgery  . Obese    Moderate  . Osteoporosis   . Postmenopausal status   . PSP (progressive supranuclear palsy) ([facility]) 12/09   neuro Dr.Willis, initially dx as parkinson, dx changed to PSP in 2010  . Severe dysarthria 12/23/2014  . SOB (shortness of breath)    s/p a negative Cardio-pulmonary stress test 08-27-04, normal PFT s 2006      Past Surgical History:  Procedure Laterality Date  . CATARACT EXTRACTION     right  . fracture arm    . MASTECTOMY    . SPINAL FUSION  10/24/07   6 screws  . TOTAL ABDOMINAL HYSTERECTOMY W/ BILATERAL SALPINGOOPHORECTOMY  1990    Social History   Social History  . Marital status: Married    Spouse name: N/A  . Number of children: 0  . Years of education: N/A   Occupational History  .  disable Unemployed   Social History Main Topics  . Smoking status: Former Smoker  . Smokeless tobacco: Never Used  . Alcohol use No  . Drug use: No  . Sexual activity: No   Other Topics Concern  . Not on file   Social History Narrative   Household-- pt and husband   Patient is right handed   Patient does not drink caffeine.      Allergies as of 02/08/2017      Reactions   Zostavax [zoster Vaccine Live]    Unable to take d/t Neosporin allergy   Neosporin [neomycin-bacitracin Zn-polymyx] Rash      Medication List       Accurate as of 02/08/17 11:59 PM. Always use your most recent med list.          acetaminophen 500 MG tablet Commonly known as:  TYLENOL Take 500 mg by mouth every 6 (six) hours as needed for moderate pain.   CALCIUM-VITAMIN D PO Take 1 tablet by mouth daily.   fenofibrate 48 MG tablet Commonly known as:  TRICOR Take 1 tablet (48 mg total) by mouth at bedtime.   ferrous sulfate 325 (65 FE) MG tablet Take 1 tablet (325 mg total) by mouth 2 (two) times daily with a meal.   furosemide 20 MG tablet   Commonly known as:  LASIX Take 2 tablets (40 mg total) by mouth daily.   ibuprofen 200 MG tablet Commonly known as:  ADVIL,MOTRIN Take 600 mg by mouth every 6 (six) hours as needed for moderate pain.   levothyroxine 50 MCG tablet Commonly known as:  SYNTHROID, LEVOTHROID Take 1 tablet (50 mcg total) by mouth daily before breakfast.   losartan 100 MG tablet Commonly known as:  COZAAR Take 1 tablet (100 mg total) by mouth daily.   multivitamin capsule Take 1 capsule by mouth daily.   nortriptyline 25 MG capsule Commonly known as:  PAMELOR Take 1 capsule (25 mg total) by mouth at bedtime.   oxyCODONE 15 MG immediate release tablet Commonly known as:  ROXICODONE Take 1 tablet (15 mg total) by mouth every 6 (six) hours as needed for pain (Must last 28 days).   pantoprazole 40 MG tablet Commonly known as:  PROTONIX Take 1 tablet (40 mg total) by  mouth 2 (two) times daily.   potassium chloride SA 20 MEQ tablet Commonly known as:  K-DUR,KLOR-CON Take 2 tablets (40 mEq total) by mouth 2 (two) times daily.            Discharge Care Instructions        Start     Ordered   02/08/17 0000  Flu vaccine HIGH DOSE PF (Fluzone High dose)     02/08/17 1541   02/08/17 0000  Comp Met (CMET)     02/08/17 1547   02/08/17 0000  CBC w/Diff     02/08/17 1547   02/08/17 0000  TSH     02/08/17 1547   02/08/17 0000  DG Chest 2 View    Question Answer Comment  Reason for Exam (SYMPTOM  OR DIAGNOSIS REQUIRED) cough   Preferred imaging location? [facility]      02/08/17 1547         Objective:   Physical Exam BP 118/70 (BP Location: Left Arm, Cuff Size: Large)   Pulse 78   Temp 98.4 F (36.9 C) (Oral)   Resp 16   Ht 5' 6" (1.676 m)   SpO2 97%  General:   Well developed, since seen a Wilcher. HEENT:  Normocephalic . Face symmetric, atraumatic Lungs:  Poor inspiratory effort, mild audible large airway congestion, no crackles. Normal respiratory effort, no intercostal retractions, no accessory muscle use. Heart: RRR,  no murmur.  No pretibial edema bilaterally  Skin: Not pale. Not jaundice Neurologic:  alert & oriented X3.  Speech minimal, dysarthric, gait not tested. Face is masked. Psych--  No anxious or depressed appearing.      Assessment & Plan:    Assessment HTN Hyperlipidemia Hypothyroidism Depression Osteoporosis- declined dexa 04-2014, again 07-2015 Back pain- oxycodone 2-3 qd  GERD PSP dx 2009 initially dx w/ Parkinson, dx changed to PSP 2010. Gait abnormality, dysphagia Anemia: Low iron 08/06/2015, declined referral H/o Breast cancer 1996 H/o Melanoma 2006 H/o SOB: Negative cardiopulmonary stress test 2006, normal PFTs 2006  PLAN: HTN: No ambulatory BPs, BP today is very good, continue Lasix, losartan, potassium. Check a CMP Hypothyroidism: Continue Synthroid, check a TSH History of  anemia: on iron BID, check a CBC. Failure to thrive: Slightly weaker lately, see HPI, ROS neg except for mild cough and large airway congestion. Will get a UA, chest x-ray. I also talked about palliative care, Information provided, recommend them to think about it. RTC 3 months.   

## 2017-02-09 LAB — CBC WITH DIFFERENTIAL/PLATELET
Basophils Absolute: 0 10*3/uL (ref 0.0–0.1)
Basophils Relative: 0.4 % (ref 0.0–3.0)
EOS ABS: 0 10*3/uL (ref 0.0–0.7)
Eosinophils Relative: 0.5 % (ref 0.0–5.0)
HCT: 37.5 % (ref 36.0–46.0)
Hemoglobin: 12.2 g/dL (ref 12.0–15.0)
LYMPHS PCT: 19.5 % (ref 12.0–46.0)
Lymphs Abs: 1.9 10*3/uL (ref 0.7–4.0)
MCHC: 32.5 g/dL (ref 30.0–36.0)
MCV: 88.4 fl (ref 78.0–100.0)
Monocytes Absolute: 0.6 10*3/uL (ref 0.1–1.0)
Monocytes Relative: 6.3 % (ref 3.0–12.0)
NEUTROS PCT: 73.3 % (ref 43.0–77.0)
Neutro Abs: 7.1 10*3/uL (ref 1.4–7.7)
Platelets: 329 10*3/uL (ref 150.0–400.0)
RBC: 4.24 Mil/uL (ref 3.87–5.11)
RDW: 13.9 % (ref 11.5–15.5)
WBC: 9.7 10*3/uL (ref 4.0–10.5)

## 2017-02-09 LAB — COMPREHENSIVE METABOLIC PANEL
ALK PHOS: 54 U/L (ref 39–117)
ALT: 14 U/L (ref 0–35)
AST: 14 U/L (ref 0–37)
Albumin: 4.2 g/dL (ref 3.5–5.2)
BILIRUBIN TOTAL: 0.4 mg/dL (ref 0.2–1.2)
BUN: 21 mg/dL (ref 6–23)
CALCIUM: 10 mg/dL (ref 8.4–10.5)
CO2: 25 meq/L (ref 19–32)
Chloride: 108 mEq/L (ref 96–112)
Creatinine, Ser: 0.63 mg/dL (ref 0.40–1.20)
GFR: 98.69 mL/min (ref >60.00)
Glucose, Bld: 98 mg/dL (ref 70–99)
Potassium: 3.7 mEq/L (ref 3.5–5.1)
Sodium: 143 mEq/L (ref 135–145)
Total Protein: 6.9 g/dL (ref 6.0–8.3)

## 2017-02-09 LAB — TSH: TSH: 1.46 u[IU]/mL (ref 0.35–4.50)

## 2017-02-09 NOTE — Assessment & Plan Note (Signed)
HTN: No ambulatory BPs, BP today is very good, continue Lasix, losartan, potassium. Check a CMP Hypothyroidism: Continue Synthroid, check a TSH History of anemia: on iron BID, check a CBC. Failure to thrive: Slightly weaker lately, see HPI, ROS neg except for mild cough and large airway congestion. Will get a UA, chest x-ray. I also talked about palliative care, Information provided, recommend them to think about it. RTC 3 months.

## 2017-02-18 ENCOUNTER — Other Ambulatory Visit: Payer: Self-pay | Admitting: Internal Medicine

## 2017-03-04 ENCOUNTER — Other Ambulatory Visit: Payer: Self-pay | Admitting: Internal Medicine

## 2017-03-06 ENCOUNTER — Other Ambulatory Visit: Payer: Self-pay | Admitting: Internal Medicine

## 2017-03-08 DIAGNOSIS — D3131 Benign neoplasm of right choroid: Secondary | ICD-10-CM | POA: Diagnosis not present

## 2017-03-08 DIAGNOSIS — Z961 Presence of intraocular lens: Secondary | ICD-10-CM | POA: Diagnosis not present

## 2017-03-08 DIAGNOSIS — H25812 Combined forms of age-related cataract, left eye: Secondary | ICD-10-CM | POA: Diagnosis not present

## 2017-03-16 DIAGNOSIS — H2512 Age-related nuclear cataract, left eye: Secondary | ICD-10-CM | POA: Diagnosis not present

## 2017-03-21 DIAGNOSIS — H25812 Combined forms of age-related cataract, left eye: Secondary | ICD-10-CM | POA: Diagnosis not present

## 2017-03-21 DIAGNOSIS — H2512 Age-related nuclear cataract, left eye: Secondary | ICD-10-CM | POA: Diagnosis not present

## 2017-04-04 ENCOUNTER — Telehealth: Payer: Self-pay | Admitting: Internal Medicine

## 2017-04-04 DIAGNOSIS — G231 Progressive supranuclear ophthalmoplegia [Steele-Richardson-Olszewski]: Secondary | ICD-10-CM

## 2017-04-04 DIAGNOSIS — R269 Unspecified abnormalities of gait and mobility: Secondary | ICD-10-CM

## 2017-04-04 NOTE — Telephone Encounter (Signed)
Pt's spouse called in to get a order from provider. He said that they are renting a hospital bed from [place] and it has a hole in it. He said that he was advised by [facility] to have PCP write a Rx for a new mattress. He provided fax # to [facility]: [phone]

## 2017-04-05 NOTE — Telephone Encounter (Signed)
Okay to put an order

## 2017-04-05 NOTE — Telephone Encounter (Signed)
Orders faxed to [place] at [phone].

## 2017-04-05 NOTE — Telephone Encounter (Signed)
Orders printed for mattress and gel overlay, awaiting MD signature.

## 2017-04-05 NOTE — Telephone Encounter (Signed)
Please advise 

## 2017-04-11 ENCOUNTER — Telehealth: Payer: Self-pay | Admitting: Internal Medicine

## 2017-04-11 NOTE — Telephone Encounter (Signed)
See notes from 04/04/2017. Gel overlay and mattress were sent.

## 2017-04-11 NOTE — Telephone Encounter (Signed)
Copied from [place] [phone]. Topic: Quick Communication - See Telephone Encounter >> Apr 11, 2017 12:54 PM Cleaster Corin, NT wrote: CRM for notification. See Telephone encounter for:   04/11/17. Pt. Husband called and wanted to make sure fax was sent to [facility] for his wife Acupuncturist) for new cover (that goes on top of the hospital bed)

## 2017-04-12 ENCOUNTER — Telehealth: Payer: Self-pay | Admitting: Neurology

## 2017-04-12 MED ORDER — OXYCODONE HCL 15 MG PO TABS: 15.0000 mg | ORAL_TABLET | Freq: Four times a day (QID) | ORAL | 0 refills | Status: DC | PRN

## 2017-04-12 NOTE — Telephone Encounter (Signed)
Pt spouse has called stating pt is in need of a refill of oxyCODONE (ROXICODONE) 15 MG immediate release tablet

## 2017-04-12 NOTE — Telephone Encounter (Signed)
I will write the prescription for the oxycodone.  The [place] registry was checked.

## 2017-04-12 NOTE — Addendum Note (Signed)
Addended by: Kathrynn Ducking on: 04/12/2017 04:16 PM   Modules accepted: Orders

## 2017-04-13 NOTE — Telephone Encounter (Signed)
Placed printed/signed rx up front for patient pick up.  

## 2017-04-16 ENCOUNTER — Other Ambulatory Visit: Payer: Self-pay | Admitting: Internal Medicine

## 2017-04-24 ENCOUNTER — Other Ambulatory Visit: Payer: Self-pay | Admitting: Internal Medicine

## 2017-05-03 ENCOUNTER — Encounter: Payer: Self-pay | Admitting: Neurology

## 2017-05-03 ENCOUNTER — Ambulatory Visit (INDEPENDENT_AMBULATORY_CARE_PROVIDER_SITE_OTHER): Payer: Medicare Other | Admitting: Neurology

## 2017-05-03 VITALS — BP 138/70 | HR 71 | Ht 66.0 in

## 2017-05-03 DIAGNOSIS — R471 Dysarthria and anarthria: Secondary | ICD-10-CM | POA: Diagnosis not present

## 2017-05-03 DIAGNOSIS — R269 Unspecified abnormalities of gait and mobility: Secondary | ICD-10-CM

## 2017-05-03 DIAGNOSIS — G231 Progressive supranuclear ophthalmoplegia [Steele-Richardson-Olszewski]: Secondary | ICD-10-CM | POA: Diagnosis not present

## 2017-05-03 NOTE — Progress Notes (Signed)
Reason for visit: Progressive supranuclear palsy  Emily Mccullough is an 71 y.o. female  History of present illness:  Emily Mccullough is a 71 year old right-handed white female with a history of progressive supranuclear palsy.  She continues to progress, she is having increasing problems with talking and swallowing.  She has not had any episodes of pneumonia since last seen.  The patient uses a wheelchair for mobility.  The patient is now having a tendency to lean to the right with sitting.  She denies any significant pain at this point.  She is still cognitively sharp but she is somewhat slower with processing information.  The patient comes to this office for further evaluation.  Past Medical History:  Diagnosis Date  . Abnormality of gait 06/25/2015  . Allergic rhinitis   . Breast CA ([place]) 1996  . Chronic back pain   . Colonic polyp   . Depression   . Dysphagia 06/25/2015  . GERD (gastroesophageal reflux disease)   . Headache(784.0)    migraines   . History of blood transfusion 2009   [facility]  . Hoarseness   . Hyperlipidemia   . Hypertension   . Hypothyroidism   . Melanoma ([place]) 2006   in situ nose,s/p mohs surgery  . Obese    Moderate  . Osteoporosis   . Postmenopausal status   . PSP (progressive supranuclear palsy) ([place]) 12/09   neuro Dr.Willis, initially dx as parkinson, dx changed to PSP in 2010  . Severe dysarthria 12/23/2014  . SOB (shortness of breath)    s/p a negative Cardio-pulmonary stress test 08-27-04, normal PFT s 2006      Past Surgical History:  Procedure Laterality Date  . CATARACT EXTRACTION     right  . fracture arm    . MASTECTOMY    . SPINAL FUSION  10/24/07   6 screws  . TOTAL ABDOMINAL HYSTERECTOMY W/ BILATERAL SALPINGOOPHORECTOMY  1990    Family History  Problem Relation Age of Onset  . Coronary artery disease Father        MI at age 38  . Emphysema Father   . Colon cancer Father        age  . Hypertension Father   . Diabetes Mother     . Hypertension Mother   . Stroke Unknown        GF  . Breast cancer Maternal Aunt     Social history:  reports that she has quit smoking. she has never used smokeless tobacco. She reports that she does not drink alcohol or use drugs.    Allergies  Allergen Reactions  . Zostavax [Zoster Vaccine Live]     Unable to take d/t Neosporin allergy  . Neosporin [Neomycin-Bacitracin Zn-Polymyx] Rash    Medications:  Prior to Admission medications   Medication Sig Start Date End Date Taking? Authorizing Provider  acetaminophen (TYLENOL) 500 MG tablet Take 500 mg by mouth every 6 (six) hours as needed for moderate pain.   Yes [provider]  CALCIUM-VITAMIN D PO Take 1 tablet by mouth daily.    Yes [provider]  fenofibrate (TRICOR) 48 MG tablet Take 1 tablet (48 mg total) by mouth at bedtime. 10/21/16  Yes Paz, Alda Berthold, MD  ferrous sulfate (FEROSUL) 325 (65 FE) MG tablet Take 1 tablet (325 mg total) by mouth 2 (two) times daily with a meal. 03/04/17  Yes Paz, Alda Berthold, MD  furosemide (LASIX) 20 MG tablet Take 2 tablets (40 mg  total) by mouth daily. 12/20/16  Yes Paz, Alda Berthold, MD  ibuprofen (ADVIL,MOTRIN) 200 MG tablet Take 600 mg by mouth every 6 (six) hours as needed for moderate pain.    Yes [provider]  levothyroxine (SYNTHROID, LEVOTHROID) 50 MCG tablet Take 1 tablet (50 mcg total) by mouth daily before breakfast. 04/18/17  Yes Paz, Alda Berthold, MD  losartan (COZAAR) 100 MG tablet TAKE 1 TABLET(100 MG) BY MOUTH DAILY 04/25/17  Yes Colon Branch, MD  Multiple Vitamin (MULTIVITAMIN) capsule Take 1 capsule by mouth daily.     Yes [provider]  nortriptyline (PAMELOR) 25 MG capsule Take 1 capsule (25 mg total) by mouth at bedtime. 12/20/16  Yes Paz, Alda Berthold, MD  oxyCODONE (ROXICODONE) 15 MG immediate release tablet Take 1 tablet (15 mg total) by mouth every 6 (six) hours as needed for pain (Must last 28 days). 04/12/17  Yes Kathrynn Ducking, MD  pantoprazole  (PROTONIX) 40 MG tablet Take 1 tablet (40 mg total) by mouth 2 (two) times daily before a meal. 03/07/17  Yes Paz, Alda Berthold, MD  potassium chloride SA (K-DUR,KLOR-CON) 20 MEQ tablet Take 2 tablets (40 mEq total) by mouth 2 (two) times daily. 02/18/17  Yes Paz, Alda Berthold, MD    ROS:  Out of a complete 14 system review of symptoms, the patient complains only of the following symptoms, and all other reviewed systems are negative.  Cough Dizziness, speech difficulty  Blood pressure 138/70, pulse 71, height 5\' 6"  (1.676 m), SpO2 95 %.  Physical Exam  General: The patient is alert and cooperative at the time of the examination.  Skin: No significant peripheral edema is noted.   Neurologic Exam  Mental status: The patient is alert and oriented x 3 at the time of the examination. The patient has apparent normal recent and remote memory, with an apparently normal attention span and concentration ability.   Cranial nerves: Facial symmetry is present. Speech is hypophonic and dysarthric, very difficult to understand. Extraocular movements are full. Visual fields are full.  Motor: The patient has good strength in all 4 extremities.  Sensory examination: Soft touch sensation is symmetric on the face, arms, and legs.  Coordination: The patient has good finger-nose-finger and heel-to-shin bilaterally.  Gait and station: The patient is wheelchair-bound, gait was not tested, she is nonambulatory.  Reflexes: Deep tendon reflexes are symmetric.   Assessment/Plan:  1.  Progressive supranuclear palsy  The patient remains wheelchair-bound, her speech and swallowing are gradually worsening over time.  The patient claims that her pain level is under good control currently.  We will continue the oxycodone if needed.  She will follow-up in 6 months.  Jill Alexanders MD 05/03/2017 12:14 PM  [facility] [address] [place], [place] [address]  Phone [phone] Fax  [phone]

## 2017-05-11 ENCOUNTER — Ambulatory Visit (INDEPENDENT_AMBULATORY_CARE_PROVIDER_SITE_OTHER): Payer: Medicare Other | Admitting: Internal Medicine

## 2017-05-11 ENCOUNTER — Encounter: Payer: Self-pay | Admitting: Internal Medicine

## 2017-05-11 VITALS — BP 142/69 | HR 67 | Temp 97.9°F | Resp 16

## 2017-05-11 DIAGNOSIS — I10 Essential (primary) hypertension: Secondary | ICD-10-CM | POA: Diagnosis not present

## 2017-05-11 DIAGNOSIS — G231 Progressive supranuclear ophthalmoplegia [Steele-Richardson-Olszewski]: Secondary | ICD-10-CM | POA: Diagnosis not present

## 2017-05-11 DIAGNOSIS — E034 Atrophy of thyroid (acquired): Secondary | ICD-10-CM

## 2017-05-11 NOTE — Assessment & Plan Note (Signed)
HTN: Last CMP satisfactory, continue Lasix, losartan, potassium. Hypothyroidism: Last TSH satisfactory, continue Synthroid. PSP: Note from neurology reviewed. Failure to thrive: The patient seems to be stable, actually if anything looks a little better today. RTC 4 months

## 2017-05-11 NOTE — Patient Instructions (Signed)
   GO TO THE FRONT DESK Schedule your next appointment for a check up in 4 months     

## 2017-05-11 NOTE — Progress Notes (Signed)
Subjective:    Patient ID: Emily Mccullough, female    DOB: 09-Mar-1946, 71 y.o.   MRN: [id number]  DOS:  05/11/2017 Type of visit - description : rov Interval history: Here with her husband. She has no new concerns. According to the husband, her speech is slightly and gradually worse. Also she has a tendency to lean to the right most of the time when she is seating, however she is not developing skin problems. Labs and medications reviewed.  note from neurology reviewed  Review of Systems Denies fever chills Appetite is okay No abdominal pain or dysuria  Past Medical History:  Diagnosis Date  . Abnormality of gait 06/25/2015  . Allergic rhinitis   . Breast CA ([place]) 1996  . Chronic back pain   . Colonic polyp   . Depression   . Dysphagia 06/25/2015  . GERD (gastroesophageal reflux disease)   . Headache(784.0)    migraines   . History of blood transfusion 2009   [facility]  . Hoarseness   . Hyperlipidemia   . Hypertension   . Hypothyroidism   . Melanoma ([place]) 2006   in situ nose,s/p mohs surgery  . Obese    Moderate  . Osteoporosis   . Postmenopausal status   . PSP (progressive supranuclear palsy) ([place]) 12/09   neuro Dr.Willis, initially dx as parkinson, dx changed to PSP in 2010  . Severe dysarthria 12/23/2014  . SOB (shortness of breath)    s/p a negative Cardio-pulmonary stress test 08-27-04, normal PFT s 2006      Past Surgical History:  Procedure Laterality Date  . CATARACT EXTRACTION     right  . fracture arm    . MASTECTOMY    . SPINAL FUSION  10/24/07   6 screws  . TOTAL ABDOMINAL HYSTERECTOMY W/ BILATERAL SALPINGOOPHORECTOMY  1990    Social History   Socioeconomic History  . Marital status: Married    Spouse name: Not on file  . Number of children: 0  . Years of education: Not on file  . Highest education level: Not on file  Social Needs  . Financial resource strain: Not on file  . Food insecurity - worry: Not on file  . Food insecurity -  inability: Not on file  . Transportation needs - medical: Not on file  . Transportation needs - non-medical: Not on file  Occupational History  . Occupation: disable    Employer: UNEMPLOYED  Tobacco Use  . Smoking status: Former Research scientist (life sciences)  . Smokeless tobacco: Never Used  Substance and Sexual Activity  . Alcohol use: No  . Drug use: No  . Sexual activity: No  Other Topics Concern  . Not on file  Social History Narrative   Household-- pt and husband   Patient is right handed   Patient does not drink caffeine.      Allergies as of 05/11/2017      Reactions   Zostavax [zoster Vaccine Live]    Unable to take d/t Neosporin allergy   Neosporin [neomycin-bacitracin Zn-polymyx] Rash      Medication List        Accurate as of 05/11/17  5:47 PM. Always use your most recent med list.          acetaminophen 500 MG tablet Commonly known as:  TYLENOL Take 500 mg by mouth every 6 (six) hours as needed for moderate pain.   CALCIUM-VITAMIN D PO Take 1 tablet by mouth daily.   fenofibrate 48 MG tablet  Commonly known as:  TRICOR Take 1 tablet (48 mg total) by mouth at bedtime.   ferrous sulfate 325 (65 FE) MG tablet Commonly known as:  FEROSUL Take 1 tablet (325 mg total) by mouth 2 (two) times daily with a meal.   furosemide 20 MG tablet Commonly known as:  LASIX Take 2 tablets (40 mg total) by mouth daily.   ibuprofen 200 MG tablet Commonly known as:  ADVIL,MOTRIN Take 600 mg by mouth every 6 (six) hours as needed for moderate pain.   levothyroxine 50 MCG tablet Commonly known as:  SYNTHROID, LEVOTHROID Take 1 tablet (50 mcg total) by mouth daily before breakfast.   losartan 100 MG tablet Commonly known as:  COZAAR TAKE 1 TABLET(100 MG) BY MOUTH DAILY   multivitamin capsule Take 1 capsule by mouth daily.   nortriptyline 25 MG capsule Commonly known as:  PAMELOR Take 1 capsule (25 mg total) by mouth at bedtime.   oxyCODONE 15 MG immediate release tablet Commonly  known as:  ROXICODONE Take 1 tablet (15 mg total) by mouth every 6 (six) hours as needed for pain (Must last 28 days).   pantoprazole 40 MG tablet Commonly known as:  PROTONIX Take 1 tablet (40 mg total) by mouth 2 (two) times daily before a meal.   potassium chloride SA 20 MEQ tablet Commonly known as:  K-DUR,KLOR-CON Take 2 tablets (40 mEq total) by mouth 2 (two) times daily.          Objective:   Physical Exam BP (!) 142/69 (BP Location: Left Arm, Patient Position: Sitting, Cuff Size: Small)   Pulse 67   Temp 97.9 F (36.6 C) (Oral)   Resp 16   SpO2 100%   General:   Well developed, well nourished . NAD.  HEENT:  Normocephalic . Face  atraumatic Lungs:  CTA B Normal respiratory effort, no intercostal retractions, no accessory muscle use. Heart: RRR,  no murmur.  No pretibial edema bilaterally  Skin: Not pale. Not jaundice Neurologic:  alert & oriented X3.  Otherwise neurological exam seems at baseline, actually if anything she looks better today, making more eye contact with me, answering simple questions, voice is very low but again better than before Psych--  No anxious or depressed appearing.      Assessment & Plan:   Assessment HTN Hyperlipidemia Hypothyroidism Depression Osteoporosis- declined dexa 04-2014, again 07-2015 Back pain- oxycodone 2-3 qd  GERD PSP dx 2009 initially dx w/ Parkinson, dx changed to PSP 2010. Gait abnormality, dysphagia Anemia: Low iron 08/06/2015, declined referral H/o Breast cancer 1996 H/o Melanoma 2006 H/o SOB: Negative cardiopulmonary stress test 2006, normal PFTs 2006  PLAN: HTN: Last CMP satisfactory, continue Lasix, losartan, potassium. Hypothyroidism: Last TSH satisfactory, continue Synthroid. PSP: Note from neurology reviewed. Failure to thrive: The patient seems to be stable, actually if anything looks a little better today. RTC 4 months

## 2017-05-31 ENCOUNTER — Telehealth: Payer: Self-pay | Admitting: Neurology

## 2017-05-31 NOTE — Telephone Encounter (Signed)
Pt's husband request refill for oxyCODONE (ROXICODONE) 15 MG immediate release tablet

## 2017-06-01 MED ORDER — OXYCODONE HCL 15 MG PO TABS: 15.0000 mg | ORAL_TABLET | Freq: Four times a day (QID) | ORAL | 0 refills | Status: DC | PRN

## 2017-06-01 NOTE — Telephone Encounter (Signed)
Placed printed/signed rx up front for patient pick up.  

## 2017-06-01 NOTE — Telephone Encounter (Signed)
The oxycodone prescription will be refilled. 

## 2017-06-18 ENCOUNTER — Other Ambulatory Visit: Payer: Self-pay | Admitting: Internal Medicine

## 2017-08-08 ENCOUNTER — Telehealth: Payer: Self-pay | Admitting: *Deleted

## 2017-08-08 NOTE — Telephone Encounter (Signed)
Received fax confirmation

## 2017-08-08 NOTE — Telephone Encounter (Signed)
Received Physician Orders from [facility] for catheter and supplies. A note was attached to "call company and have order resent for Emily Mccullough, patient's husband". I called the company at [phone] and spoke with representative Sherrian Divers; he looked up the patient and her husband, and only Emily Mccullough has an account with [facility]. Sherrian Divers is going to call the wife and check if they need to set-up an account for pt's husband. Order forwarded to provider with OV notes 08/03/16 to present as requested by Sender/SLS 03/25

## 2017-08-08 NOTE — Telephone Encounter (Signed)
Patient husband is call to confirm that it his his wife that needs the catheter and supplies not him. If anyone has any further questions he can be reached at [phone]

## 2017-08-08 NOTE — Telephone Encounter (Signed)
Forms signed and faxed to [facility] at [phone]. Forms sent for scanning.

## 2017-08-22 ENCOUNTER — Other Ambulatory Visit: Payer: Self-pay | Admitting: Internal Medicine

## 2017-08-23 ENCOUNTER — Other Ambulatory Visit: Payer: Self-pay | Admitting: Neurology

## 2017-08-23 MED ORDER — OXYCODONE HCL 15 MG PO TABS: 15.0000 mg | ORAL_TABLET | Freq: Four times a day (QID) | ORAL | 0 refills | Status: DC | PRN

## 2017-08-23 NOTE — Telephone Encounter (Signed)
I called pt to advise her that her oxycodone RX went to the pharmacy. No answer, left a message asking her to call me back. If pt calls back, please advise her of this information.

## 2017-08-23 NOTE — Addendum Note (Signed)
Addended by: Lester Dumas A on: 08/23/2017 03:20 PM   Modules accepted: Orders

## 2017-08-23 NOTE — Telephone Encounter (Signed)
Pt calling for refill of oxyCODONE (ROXICODONE) 15 MG immediate release tablet  [facility] - [place], [address] AT [facility] [address] [phone] (Phone) [phone] (Fax)

## 2017-09-09 ENCOUNTER — Ambulatory Visit (INDEPENDENT_AMBULATORY_CARE_PROVIDER_SITE_OTHER): Payer: Medicare Other | Admitting: Internal Medicine

## 2017-09-09 ENCOUNTER — Encounter: Payer: Self-pay | Admitting: Internal Medicine

## 2017-09-09 VITALS — BP 124/68 | HR 53 | Temp 97.6°F | Resp 14

## 2017-09-09 DIAGNOSIS — G231 Progressive supranuclear ophthalmoplegia [Steele-Richardson-Olszewski]: Secondary | ICD-10-CM | POA: Diagnosis not present

## 2017-09-09 DIAGNOSIS — E039 Hypothyroidism, unspecified: Secondary | ICD-10-CM

## 2017-09-09 DIAGNOSIS — I10 Essential (primary) hypertension: Secondary | ICD-10-CM | POA: Diagnosis not present

## 2017-09-09 LAB — BASIC METABOLIC PANEL
BUN: 14 mg/dL (ref 6–23)
CO2: 29 mEq/L (ref 19–32)
Calcium: 9.5 mg/dL (ref 8.4–10.5)
Chloride: 105 mEq/L (ref 96–112)
Creatinine, Ser: 0.6 mg/dL (ref 0.40–1.20)
GFR: 104.24 mL/min (ref >60.00)
Glucose, Bld: 75 mg/dL (ref 70–99)
Potassium: 3.4 mEq/L — ABNORMAL LOW (ref 3.5–5.1)
SODIUM: 143 meq/L (ref 135–145)

## 2017-09-09 LAB — TSH: TSH: 1.39 u[IU]/mL (ref 0.35–4.50)

## 2017-09-09 NOTE — Patient Instructions (Signed)
GO TO THE LAB : Get the blood work     GO TO THE FRONT DESK Schedule your next appointment for a   checkup in 6 months 

## 2017-09-09 NOTE — Progress Notes (Signed)
Pre visit review using our clinic review tool, if applicable. No additional management support is needed unless otherwise documented below in the visit note. 

## 2017-09-09 NOTE — Progress Notes (Signed)
Subjective:    Patient ID: Emily Mccullough, female    DOB: May 30, 1945, 72 y.o.   MRN: [id number]  DOS:  09/09/2017 Type of visit - description : f/u Interval history: Here with her husband. PSP: She is having more difficulty swallowing her food, eating sometimes associated with choking and occasional cough. Dyslipidemia: Unable to afford fenofibrate HTN: Good compliance with medication, no ambulatory BPs   Review of Systems Other than above feels well, no fever chills, no persistent cough other than when she eats  Past Medical History:  Diagnosis Date  . Abnormality of gait 06/25/2015  . Allergic rhinitis   . Breast CA ([place]) 1996  . Chronic back pain   . Colonic polyp   . Depression   . Dysphagia 06/25/2015  . GERD (gastroesophageal reflux disease)   . Headache(784.0)    migraines   . History of blood transfusion 2009   [facility]  . Hoarseness   . Hyperlipidemia   . Hypertension   . Hypothyroidism   . Melanoma ([place]) 2006   in situ nose,s/p mohs surgery  . Obese    Moderate  . Osteoporosis   . Postmenopausal status   . PSP (progressive supranuclear palsy) ([place]) 12/09   neuro Dr.Willis, initially dx as parkinson, dx changed to PSP in 2010  . Severe dysarthria 12/23/2014  . SOB (shortness of breath)    s/p a negative Cardio-pulmonary stress test 08-27-04, normal PFT s 2006      Past Surgical History:  Procedure Laterality Date  . CATARACT EXTRACTION     right  . fracture arm    . MASTECTOMY    . SPINAL FUSION  10/24/07   6 screws  . TOTAL ABDOMINAL HYSTERECTOMY W/ BILATERAL SALPINGOOPHORECTOMY  1990    Social History   Socioeconomic History  . Marital status: Married    Spouse name: Not on file  . Number of children: 0  . Years of education: Not on file  . Highest education level: Not on file  Occupational History  . Occupation: disable    Employer: UNEMPLOYED  Social Needs  . Financial resource strain: Not on file  . Food insecurity:    Worry: Not on  file    Inability: Not on file  . Transportation needs:    Medical: Not on file    Non-medical: Not on file  Tobacco Use  . Smoking status: Former Research scientist (life sciences)  . Smokeless tobacco: Never Used  Substance and Sexual Activity  . Alcohol use: No  . Drug use: No  . Sexual activity: Never  Lifestyle  . Physical activity:    Days per week: Not on file    Minutes per session: Not on file  . Stress: Not on file  Relationships  . Social connections:    Talks on phone: Not on file    Gets together: Not on file    Attends religious service: Not on file    Active member of club or organization: Not on file    Attends meetings of clubs or organizations: Not on file    Relationship status: Not on file  . Intimate partner violence:    Fear of current or ex partner: Not on file    Emotionally abused: Not on file    Physically abused: Not on file    Forced sexual activity: Not on file  Other Topics Concern  . Not on file  Social History Narrative   Household-- pt and husband   Patient is  right handed   Patient does not drink caffeine.      Allergies as of 09/09/2017      Reactions   Zostavax [zoster Vaccine Live]    Unable to take d/t Neosporin allergy   Neosporin [neomycin-bacitracin Zn-polymyx] Rash      Medication List        Accurate as of 09/09/17 11:59 PM. Always use your most recent med list.          acetaminophen 500 MG tablet Commonly known as:  TYLENOL Take 500 mg by mouth every 6 (six) hours as needed for moderate pain.   CALCIUM-VITAMIN D PO Take 1 tablet by mouth daily.   ferrous sulfate 325 (65 FE) MG tablet Commonly known as:  FEROSUL Take 1 tablet (325 mg total) by mouth 2 (two) times daily with a meal.   furosemide 20 MG tablet Commonly known as:  LASIX Take 2 tablets (40 mg total) by mouth daily.   ibuprofen 200 MG tablet Commonly known as:  ADVIL,MOTRIN Take 600 mg by mouth every 6 (six) hours as needed for moderate pain.   levothyroxine 50 MCG  tablet Commonly known as:  SYNTHROID, LEVOTHROID Take 1 tablet (50 mcg total) by mouth daily before breakfast.   losartan 100 MG tablet Commonly known as:  COZAAR TAKE 1 TABLET(100 MG) BY MOUTH DAILY   multivitamin capsule Take 1 capsule by mouth daily.   nortriptyline 25 MG capsule Commonly known as:  PAMELOR Take 1 capsule (25 mg total) by mouth at bedtime.   oxyCODONE 15 MG immediate release tablet Commonly known as:  ROXICODONE Take 1 tablet (15 mg total) by mouth every 6 (six) hours as needed for pain (Must last 28 days).   pantoprazole 40 MG tablet Commonly known as:  PROTONIX Take 1 tablet (40 mg total) by mouth 2 (two) times daily before a meal.   potassium chloride SA 20 MEQ tablet Commonly known as:  K-DUR,KLOR-CON Take 2 tablets (40 mEq total) by mouth 2 (two) times daily.          Objective:   Physical Exam BP 124/68 (BP Location: Left Arm, Patient Position: Sitting, Cuff Size: Small)   Pulse (!) 53   Temp 97.6 F (36.4 C) (Oral)   Resp 14   SpO2 97%   General:   Well developed, well nourished . NAD.  HEENT:  Normocephalic . Face  atraumatic Lungs:  CTA B Normal respiratory effort, no intercostal retractions, no accessory muscle use. Heart: RRR,  no murmur.  No pretibial edema bilaterally  Skin: Not pale. Not jaundice Neurologic:  alert & oriented X3.  We communicate via a pad where she writes on. Neurological exam seems at baseline, good eye contact   Psych--  No anxious or depressed appearing.        Assessment & Plan:   Assessment HTN Hyperlipidemia Hypothyroidism Depression Osteoporosis- declined dexa 04-2014, again 07-2015 Back pain- oxycodone 2-3 qd  GERD PSP dx 2009 initially dx w/ Parkinson, dx changed to PSP 2010. Gait abnormality, dysphagia (SP eval 03-2015) Anemia: Low iron 08/06/2015, declined referral H/o Breast cancer 1996 H/o Melanoma 2006 H/o SOB: Negative cardiopulmonary stress test 2006, normal PFTs  2006  PLAN: HTN: Continue Lasix, losartan, potassium.  Check a BMP, BP today is very good Hyperlipidemia: Unable to afford fenofibratess, for now will stop that medication.  Reassess FLP at some point. Hypothyroidism: On Synthroid, check a TSH PSP: Dysphasia getting slightly worse, last S/P evaluation 2016. Will reach out to speech  pathology, she might need further eval vs simply remind her on proper eating techniques   RTC 6 months

## 2017-09-11 NOTE — Assessment & Plan Note (Signed)
HTN: Continue Lasix, losartan, potassium.  Check a BMP, BP today is very good Hyperlipidemia: Unable to afford fenofibratess, for now will stop that medication.  Reassess FLP at some point. Hypothyroidism: On Synthroid, check a TSH PSP: Dysphasia getting slightly worse, last S/P evaluation 2016. Will reach out to speech pathology, she might need further eval vs simply remind her on proper eating techniques   RTC 6 months

## 2017-10-26 ENCOUNTER — Telehealth: Payer: Self-pay | Admitting: Internal Medicine

## 2017-10-26 DIAGNOSIS — R1310 Dysphagia, unspecified: Secondary | ICD-10-CM

## 2017-10-26 DIAGNOSIS — G231 Progressive supranuclear ophthalmoplegia [Steele-Richardson-Olszewski]: Secondary | ICD-10-CM

## 2017-10-26 NOTE — Telephone Encounter (Signed)
See last visit, they reported dysphagia was getting slightly worse, I have the chance to talk with the patient's brother and he had similar observation.  Please arrange speech pathology referral, DX dysphagia

## 2017-10-26 NOTE — Telephone Encounter (Signed)
Referral placed. LMOM informing Pt and her husband of referral that was placed, informed that they will be receiving call in the next several days to schedule an appt.

## 2017-11-10 ENCOUNTER — Ambulatory Visit (INDEPENDENT_AMBULATORY_CARE_PROVIDER_SITE_OTHER): Payer: Medicare Other | Admitting: Neurology

## 2017-11-10 ENCOUNTER — Encounter: Payer: Self-pay | Admitting: Neurology

## 2017-11-10 ENCOUNTER — Other Ambulatory Visit: Payer: Self-pay

## 2017-11-10 VITALS — BP 116/64 | HR 73 | Resp 20

## 2017-11-10 DIAGNOSIS — G231 Progressive supranuclear ophthalmoplegia [Steele-Richardson-Olszewski]: Secondary | ICD-10-CM

## 2017-11-10 NOTE — Progress Notes (Signed)
Reason for visit: PSP  Emily Mccullough is an 72 y.o. female  History of present illness:  Emily Mccullough is a 72 year old right-handed white female with a history of progressive supranuclear palsy.  The patient has had ongoing progression of her illness, she is having increasing problems with her ability to speak and to swallow.  She can stand and transfer, she cannot effectively walk.  The patient takes oxycodone on occasion for her chronic low back pain.  The patient is having increasing problems with choking with swallowing, a swallow evaluation done a year and a half ago recommended dysphagia 3 diet with thin liquids, but the patient has been getting a regular diet.  The patient has not had any falls.  She is sleeping fairly well at night.  Past Medical History:  Diagnosis Date  . Abnormality of gait 06/25/2015  . Allergic rhinitis   . Breast CA ([place]) 1996  . Chronic back pain   . Colonic polyp   . Depression   . Dysphagia 06/25/2015  . GERD (gastroesophageal reflux disease)   . Headache(784.0)    migraines   . History of blood transfusion 2009   [facility]  . Hoarseness   . Hyperlipidemia   . Hypertension   . Hypothyroidism   . Melanoma ([place]) 2006   in situ nose,s/p mohs surgery  . Obese    Moderate  . Osteoporosis   . Postmenopausal status   . PSP (progressive supranuclear palsy) ([place]) 12/09   neuro Dr.Willis, initially dx as parkinson, dx changed to PSP in 2010  . Severe dysarthria 12/23/2014  . SOB (shortness of breath)    s/p a negative Cardio-pulmonary stress test 08-27-04, normal PFT s 2006      Past Surgical History:  Procedure Laterality Date  . CATARACT EXTRACTION     right  . fracture arm    . MASTECTOMY    . SPINAL FUSION  10/24/07   6 screws  . TOTAL ABDOMINAL HYSTERECTOMY W/ BILATERAL SALPINGOOPHORECTOMY  1990    Family History  Problem Relation Age of Onset  . Coronary artery disease Father        MI at age 51  . Emphysema Father   . Colon cancer  Father        age  . Hypertension Father   . Diabetes Mother   . Hypertension Mother   . Stroke Unknown        GF  . Breast cancer Maternal Aunt     Social history:  reports that she has quit smoking. She has never used smokeless tobacco. She reports that she does not drink alcohol or use drugs.    Allergies  Allergen Reactions  . Zostavax [Zoster Vaccine Live]     Unable to take d/t Neosporin allergy  . Neosporin [Neomycin-Bacitracin Zn-Polymyx] Rash    Medications:  Prior to Admission medications   Medication Sig Start Date End Date Taking? Authorizing Provider  acetaminophen (TYLENOL) 500 MG tablet Take 500 mg by mouth every 6 (six) hours as needed for moderate pain.   Yes [provider]  CALCIUM-VITAMIN D PO Take 1 tablet by mouth daily.    Yes [provider]  furosemide (LASIX) 20 MG tablet Take 2 tablets (40 mg total) by mouth daily. 06/20/17  Yes Paz, Alda Berthold, MD  levothyroxine (SYNTHROID, LEVOTHROID) 50 MCG tablet Take 1 tablet (50 mcg total) by mouth daily before breakfast. 04/18/17  Yes Colon Branch, MD  losartan (COZAAR) 100  MG tablet TAKE 1 TABLET(100 MG) BY MOUTH DAILY 04/25/17  Yes Colon Branch, MD  Multiple Vitamin (MULTIVITAMIN) capsule Take 1 capsule by mouth daily.     Yes [provider]  nortriptyline (PAMELOR) 25 MG capsule Take 1 capsule (25 mg total) by mouth at bedtime. 12/20/16  Yes Paz, Alda Berthold, MD  oxyCODONE (ROXICODONE) 15 MG immediate release tablet Take 1 tablet (15 mg total) by mouth every 6 (six) hours as needed for pain (Must last 28 days). 08/23/17  Yes Kathrynn Ducking, MD  pantoprazole (PROTONIX) 40 MG tablet Take 1 tablet (40 mg total) by mouth 2 (two) times daily before a meal. 03/07/17  Yes Paz, Alda Berthold, MD  potassium chloride SA (K-DUR,KLOR-CON) 20 MEQ tablet Take 2 tablets (40 mEq total) by mouth 2 (two) times daily. 08/22/17  Yes Paz, Alda Berthold, MD  ferrous sulfate (FEROSUL) 325 (65 FE) MG tablet Take 1 tablet (325 mg total) by  mouth 2 (two) times daily with a meal. Patient not taking: Reported on 11/10/2017 03/04/17   Colon Branch, MD  ibuprofen (ADVIL,MOTRIN) 200 MG tablet Take 600 mg by mouth every 6 (six) hours as needed for moderate pain.     [provider]    ROS:  Out of a complete 14 system review of symptoms, the patient complains only of the following symptoms, and all other reviewed systems are negative.  Choking with swallowing Speech problems  Blood pressure 116/64, pulse 73, resp. rate 20.  Physical Exam  General: The patient is alert and cooperative at the time of the examination.  Respiratory: Lung fields are clear.  Cardiovascular: Regular rate and rhythm, no murmurs or rubs noted.  Neck: Neck is supple, no carotid bruits are noted.  Skin: No significant peripheral edema is noted.   Neurologic Exam  Mental status: The patient is alert and oriented x 3 at the time of the examination. The patient has apparent normal recent and remote memory, with an apparently normal attention span and concentration ability.   Cranial nerves: Facial symmetry is present. Speech is hypophonic, not a phasic. Extraocular movements are restricted in all planes, decreased eye blink is seen. Visual fields are full.  Motor: The patient has good strength in all 4 extremities.  Sensory examination: Soft touch sensation is symmetric on the face, arms, and legs.  Coordination: The patient has good finger-nose-finger and heel-to-shin bilaterally.  Gait and station: The patient can stand with assistance, once up, she can take a few steps with assistance, she takes short shuffling steps.  She has a tendency to lean backwards.  Reflexes: Deep tendon reflexes are symmetric.   Assessment/Plan:  1.  Progressive supranuclear palsy  2.  Gait disorder  3.  Chronic low back pain  The patient continues to progress with ability to speak and to swallow.  She is not getting a dysphasia 3 diet as recommended,  I have indicated that her food should be finally chopped, she can get thin liquids.  If she continues to choke with the recommended diet, they are to contact me and we will repeat the swallowing evaluation.  The patient otherwise will follow-up in 6 months.  Jill Alexanders MD 11/10/2017 1:20 PM  [facility] [address] [place], [place] [address]  Phone [phone] Fax [phone]

## 2017-11-12 ENCOUNTER — Other Ambulatory Visit: Payer: Self-pay | Admitting: Internal Medicine

## 2017-11-22 ENCOUNTER — Other Ambulatory Visit: Payer: Self-pay | Admitting: Neurology

## 2017-11-22 MED ORDER — OXYCODONE HCL 15 MG PO TABS: 15.0000 mg | ORAL_TABLET | Freq: Four times a day (QID) | ORAL | 0 refills | Status: DC | PRN

## 2017-11-22 NOTE — Telephone Encounter (Signed)
Pts husband Glennon Mac requesting a refill for oxyCODONE (ROXICODONE) 15 MG immediate release tablet sent to [facility]

## 2018-01-08 ENCOUNTER — Other Ambulatory Visit: Payer: Self-pay | Admitting: Internal Medicine

## 2018-01-20 ENCOUNTER — Other Ambulatory Visit: Payer: Self-pay | Admitting: Internal Medicine

## 2018-02-20 ENCOUNTER — Encounter: Payer: Self-pay | Admitting: Internal Medicine

## 2018-02-20 ENCOUNTER — Ambulatory Visit (INDEPENDENT_AMBULATORY_CARE_PROVIDER_SITE_OTHER): Payer: Medicare Other | Admitting: Internal Medicine

## 2018-02-20 VITALS — BP 126/64 | HR 81 | Temp 97.7°F | Resp 16 | Ht 66.0 in

## 2018-02-20 DIAGNOSIS — E785 Hyperlipidemia, unspecified: Secondary | ICD-10-CM

## 2018-02-20 DIAGNOSIS — I10 Essential (primary) hypertension: Secondary | ICD-10-CM | POA: Diagnosis not present

## 2018-02-20 DIAGNOSIS — E034 Atrophy of thyroid (acquired): Secondary | ICD-10-CM

## 2018-02-20 DIAGNOSIS — R05 Cough: Secondary | ICD-10-CM | POA: Diagnosis not present

## 2018-02-20 DIAGNOSIS — Z23 Encounter for immunization: Secondary | ICD-10-CM

## 2018-02-20 DIAGNOSIS — R059 Cough, unspecified: Secondary | ICD-10-CM

## 2018-02-20 NOTE — Patient Instructions (Signed)
GO TO THE LAB : Get the blood work     GO TO THE FRONT DESK Schedule your next appointment for a   checkup in 6 months 

## 2018-02-20 NOTE — Assessment & Plan Note (Signed)
HTN  Continue losartan, Lasix, potassium supplements, check BMP and CBC Hyperlipidemia: on no meds, not fasting, will check a lipid panel Hypothyroidism: Continue Synthroid, check a TSH Cough: On and off issue, probably due to poor inspiratory effort and difficulty managing secretions.  Also ? occ aspiration.  Feeding precautions discussed.  Offered a saline nebulizer to help w/ pulmonary secretions , will think about it  L pretibial  numbness: exam benign, observation for now  Preventive care: Flu shot today.  Shingrix discussed RTC 6 months

## 2018-02-20 NOTE — Progress Notes (Signed)
Pre visit review using our clinic review tool, if applicable. No additional management support is needed unless otherwise documented below in the visit note. 

## 2018-02-20 NOTE — Progress Notes (Signed)
Subjective:    Patient ID: Emily Mccullough, female    DOB: 01/01/1946, 72 y.o.   MRN: [id number]  DOS:  02/20/2018 Type of visit - description : rov here with her husband. Interval history: Interview is limited due to patient's PSP.  Most of information is gathered from her husband. She states she is doing okay, her only concern is some numbness at the left pretibial area for few months.  She reports no injury, swelling or redness.   Review of Systems  Patient husband reported no fever chills but from time to time she has episodes of cough, sometimes related with food intake. Sometimes they hear a lot of chest congestion.  Unclear if she produces mucus. (husband is a poor historian)  Past Medical History:  Diagnosis Date  . Abnormality of gait 06/25/2015  . Allergic rhinitis   . Breast CA ([place]) 1996  . Chronic back pain   . Colonic polyp   . Depression   . Dysphagia 06/25/2015  . GERD (gastroesophageal reflux disease)   . Headache(784.0)    migraines   . History of blood transfusion 2009   [facility]  . Hoarseness   . Hyperlipidemia   . Hypertension   . Hypothyroidism   . Melanoma ([place]) 2006   in situ nose,s/p mohs surgery  . Obese    Moderate  . Osteoporosis   . Postmenopausal status   . PSP (progressive supranuclear palsy) ([place]) 12/09   neuro Dr.Willis, initially dx as parkinson, dx changed to PSP in 2010  . Severe dysarthria 12/23/2014  . SOB (shortness of breath)    s/p a negative Cardio-pulmonary stress test 08-27-04, normal PFT s 2006      Past Surgical History:  Procedure Laterality Date  . CATARACT EXTRACTION     right  . fracture arm    . MASTECTOMY    . SPINAL FUSION  10/24/07   6 screws  . TOTAL ABDOMINAL HYSTERECTOMY W/ BILATERAL SALPINGOOPHORECTOMY  1990    Social History   Socioeconomic History  . Marital status: Married    Spouse name: Not on file  . Number of children: 0  . Years of education: Not on file  . Highest education level: Not on  file  Occupational History  . Occupation: disable    Employer: UNEMPLOYED  Social Needs  . Financial resource strain: Not on file  . Food insecurity:    Worry: Not on file    Inability: Not on file  . Transportation needs:    Medical: Not on file    Non-medical: Not on file  Tobacco Use  . Smoking status: Former Research scientist (life sciences)  . Smokeless tobacco: Never Used  Substance and Sexual Activity  . Alcohol use: No  . Drug use: No  . Sexual activity: Not Currently  Lifestyle  . Physical activity:    Days per week: Not on file    Minutes per session: Not on file  . Stress: Not on file  Relationships  . Social connections:    Talks on phone: Not on file    Gets together: Not on file    Attends religious service: Not on file    Active member of club or organization: Not on file    Attends meetings of clubs or organizations: Not on file    Relationship status: Not on file  . Intimate partner violence:    Fear of current or ex partner: Not on file    Emotionally abused: Not on file  Physically abused: Not on file    Forced sexual activity: Not on file  Other Topics Concern  . Not on file  Social History Narrative   Household-- pt and husband   Patient is right handed   Patient does not drink caffeine.      Allergies as of 02/20/2018      Reactions   Zostavax [zoster Vaccine Live]    Unable to take d/t Neosporin allergy   Neosporin [neomycin-bacitracin Zn-polymyx] Rash      Medication List        Accurate as of 02/20/18  9:19 PM. Always use your most recent med list.          acetaminophen 500 MG tablet Commonly known as:  TYLENOL Take 500 mg by mouth every 6 (six) hours as needed for moderate pain.   CALCIUM-VITAMIN D PO Take 1 tablet by mouth daily.   ferrous sulfate 325 (65 FE) MG tablet Take 1 tablet (325 mg total) by mouth 2 (two) times daily with a meal.   furosemide 20 MG tablet Commonly known as:  LASIX TAKE 2 TABLETS(40 MG) BY MOUTH DAILY   ibuprofen 200  MG tablet Commonly known as:  ADVIL,MOTRIN Take 600 mg by mouth every 6 (six) hours as needed for moderate pain.   levothyroxine 50 MCG tablet Commonly known as:  SYNTHROID, LEVOTHROID Take 1 tablet (50 mcg total) by mouth daily before breakfast.   losartan 100 MG tablet Commonly known as:  COZAAR TAKE 1 TABLET(100 MG) BY MOUTH DAILY   multivitamin capsule Take 1 capsule by mouth daily.   nortriptyline 25 MG capsule Commonly known as:  PAMELOR Take 1 capsule (25 mg total) by mouth at bedtime.   oxyCODONE 15 MG immediate release tablet Commonly known as:  ROXICODONE Take 1 tablet (15 mg total) by mouth every 6 (six) hours as needed for pain (Must last 28 days).   pantoprazole 40 MG tablet Commonly known as:  PROTONIX Take 1 tablet (40 mg total) by mouth 2 (two) times daily before a meal.   potassium chloride SA 20 MEQ tablet Commonly known as:  K-DUR,KLOR-CON Take 2 tablets (40 mEq total) by mouth 2 (two) times daily.          Objective:   Physical Exam BP 126/64 (BP Location: Right Arm, Patient Position: Sitting, Cuff Size: Small)   Pulse 81   Temp 97.7 F (36.5 C) (Oral)   Resp 16   Ht 5\' 6"  (1.676 m)   SpO2 97%   BMI 27.44 kg/m  General:   Well developed, NAD, sits in a wheelchair HEENT:  Normocephalic . Face symmetric, atraumatic Lungs:  Poor inspiratory effort, few rhonchi with cough bilaterally. Normal respiratory effort, no intercostal retractions, no accessory muscle use. Heart: RRR,  no murmur.  MSK: legs w/no redness, swelling; L pretibial area w/o TTP Skin: Not pale. Not jaundice Neurologic:  alert &  Exam is limited but she is seems to be oriented, follow commands. Gait not tested. Psych--  No anxious or depressed appearing.      Assessment & Plan:   Assessment HTN Hyperlipidemia Hypothyroidism Depression Osteoporosis- declined dexa 04-2014, again 07-2015 Back pain- oxycodone 2-3 qd  GERD PSP dx 2009 initially dx w/ Parkinson, dx  changed to PSP 2010. Gait abnormality, dysphagia (SP eval 03-2015) Anemia: Low iron 08/06/2015, declined referral H/o Breast cancer 1996 H/o Melanoma 2006 H/o SOB: Negative cardiopulmonary stress test 2006, normal PFTs 2006  PLAN: HTN  Continue losartan, Lasix, potassium supplements,  check BMP and CBC Hyperlipidemia: on no meds, not fasting, will check a lipid panel Hypothyroidism: Continue Synthroid, check a TSH Cough: On and off issue, probably due to poor inspiratory effort and difficulty managing secretions.  Also ? occ aspiration.  Feeding precautions discussed.  Offered a saline nebulizer to help w/ pulmonary secretions , will think about it  L pretibial  numbness: exam benign, observation for now  Preventive care: Flu shot today.  Shingrix discussed RTC 6 months

## 2018-02-21 LAB — CBC WITH DIFFERENTIAL/PLATELET
BASOS ABS: 0 10*3/uL (ref 0.0–0.1)
Basophils Relative: 0.7 % (ref 0.0–3.0)
EOS ABS: 0.1 10*3/uL (ref 0.0–0.7)
Eosinophils Relative: 1 % (ref 0.0–5.0)
HCT: 37.6 % (ref 36.0–46.0)
Hemoglobin: 12.4 g/dL (ref 12.0–15.0)
LYMPHS ABS: 1.8 10*3/uL (ref 0.7–4.0)
LYMPHS PCT: 27.8 % (ref 12.0–46.0)
MCHC: 33 g/dL (ref 30.0–36.0)
MCV: 87.2 fl (ref 78.0–100.0)
Monocytes Absolute: 0.4 10*3/uL (ref 0.1–1.0)
Monocytes Relative: 7 % (ref 3.0–12.0)
NEUTROS ABS: 4.1 10*3/uL (ref 1.4–7.7)
NEUTROS PCT: 63.5 % (ref 43.0–77.0)
PLATELETS: 321 10*3/uL (ref 150.0–400.0)
RBC: 4.31 Mil/uL (ref 3.87–5.11)
RDW: 15.1 % (ref 11.5–15.5)
WBC: 6.4 10*3/uL (ref 4.0–10.5)

## 2018-02-21 LAB — BASIC METABOLIC PANEL
BUN: 15 mg/dL (ref 6–23)
CALCIUM: 9.7 mg/dL (ref 8.4–10.5)
CO2: 28 meq/L (ref 19–32)
CREATININE: 0.64 mg/dL (ref 0.40–1.20)
Chloride: 106 mEq/L (ref 96–112)
GFR: 96.64 mL/min (ref >60.00)
GLUCOSE: 82 mg/dL (ref 70–99)
Potassium: 4.7 mEq/L (ref 3.5–5.1)
SODIUM: 143 meq/L (ref 135–145)

## 2018-02-21 LAB — TSH: TSH: 1.27 u[IU]/mL (ref 0.35–4.50)

## 2018-02-23 ENCOUNTER — Other Ambulatory Visit: Payer: Self-pay | Admitting: Internal Medicine

## 2018-03-15 ENCOUNTER — Ambulatory Visit: Payer: Medicare Other | Admitting: Internal Medicine

## 2018-04-05 ENCOUNTER — Other Ambulatory Visit: Payer: Self-pay | Admitting: Internal Medicine

## 2018-04-20 DIAGNOSIS — Z961 Presence of intraocular lens: Secondary | ICD-10-CM | POA: Diagnosis not present

## 2018-04-20 DIAGNOSIS — H10413 Chronic giant papillary conjunctivitis, bilateral: Secondary | ICD-10-CM | POA: Diagnosis not present

## 2018-04-20 DIAGNOSIS — H04123 Dry eye syndrome of bilateral lacrimal glands: Secondary | ICD-10-CM | POA: Diagnosis not present

## 2018-04-20 DIAGNOSIS — D3131 Benign neoplasm of right choroid: Secondary | ICD-10-CM | POA: Diagnosis not present

## 2018-05-07 ENCOUNTER — Other Ambulatory Visit: Payer: Self-pay | Admitting: Internal Medicine

## 2018-05-21 ENCOUNTER — Other Ambulatory Visit: Payer: Self-pay | Admitting: Internal Medicine

## 2018-06-01 ENCOUNTER — Ambulatory Visit (INDEPENDENT_AMBULATORY_CARE_PROVIDER_SITE_OTHER): Payer: Medicare Other | Admitting: Neurology

## 2018-06-01 ENCOUNTER — Encounter: Payer: Self-pay | Admitting: Neurology

## 2018-06-01 VITALS — BP 104/62 | HR 69

## 2018-06-01 DIAGNOSIS — M545 Low back pain, unspecified: Secondary | ICD-10-CM

## 2018-06-01 DIAGNOSIS — R1312 Dysphagia, oropharyngeal phase: Secondary | ICD-10-CM

## 2018-06-01 DIAGNOSIS — G231 Progressive supranuclear ophthalmoplegia [Steele-Richardson-Olszewski]: Secondary | ICD-10-CM | POA: Diagnosis not present

## 2018-06-01 DIAGNOSIS — R471 Dysarthria and anarthria: Secondary | ICD-10-CM

## 2018-06-01 DIAGNOSIS — G8929 Other chronic pain: Secondary | ICD-10-CM

## 2018-06-01 DIAGNOSIS — R269 Unspecified abnormalities of gait and mobility: Secondary | ICD-10-CM | POA: Diagnosis not present

## 2018-06-01 MED ORDER — NORTRIPTYLINE HCL 10 MG/5ML PO SOLN: 25.0000 mg | Freq: Every day | ORAL | 5 refills | Status: DC

## 2018-06-01 NOTE — Patient Instructions (Signed)
We will switch to the nortriptyline liquid taking 25 mg at night.

## 2018-06-01 NOTE — Progress Notes (Signed)
Reason for visit: Progressive supranuclear palsy  Emily Mccullough is an 72 y.o. female  History of present illness:  Emily Mccullough is a 73 year old right-handed white female with a history of progressive supranuclear palsy.  The patient has had a significant gait disorder, she is able to stand for transfers, she cannot really effectively walk.  The patient does have low back pain but she has been able to come off of her opiate medications.  She takes nortriptyline at night but she claims at the capsule sticks in her throat frequently.  She has had dysphasia issues, she has had a dysphagia 3 thin liquid diet recommended.  She still chokes occasionally with eating.  The patient has not had any falls.  She returns for an evaluation.  Past Medical History:  Diagnosis Date  . Abnormality of gait 06/25/2015  . Allergic rhinitis   . Breast CA ([place]) 1996  . Chronic back pain   . Colonic polyp   . Depression   . Dysphagia 06/25/2015  . GERD (gastroesophageal reflux disease)   . Headache(784.0)    migraines   . History of blood transfusion 2009   [facility]  . Hoarseness   . Hyperlipidemia   . Hypertension   . Hypothyroidism   . Melanoma ([place]) 2006   in situ nose,s/p mohs surgery  . Obese    Moderate  . Osteoporosis   . Postmenopausal status   . PSP (progressive supranuclear palsy) ([place]) 12/09   neuro Dr., initially dx as parkinson, dx changed to PSP in 2010  . Severe dysarthria 12/23/2014  . SOB (shortness of breath)    s/p a negative Cardio-pulmonary stress test 08-27-04, normal PFT s 2006      Past Surgical History:  Procedure Laterality Date  . CATARACT EXTRACTION     right  . fracture arm    . MASTECTOMY    . SPINAL FUSION  10/24/07   6 screws  . TOTAL ABDOMINAL HYSTERECTOMY W/ BILATERAL SALPINGOOPHORECTOMY  1990    Family History  Problem Relation Age of Onset  . Coronary artery disease Father        MI at age 41  . Emphysema Father   . Colon cancer Father     age  . Hypertension Father   . Diabetes Mother   . Hypertension Mother   . Stroke Unknown        GF  . Breast cancer Maternal Aunt     Social history:  reports that she has quit smoking. She has never used smokeless tobacco. She reports that she does not drink alcohol or use drugs.    Allergies  Allergen Reactions  . Zostavax [Zoster Vaccine Live]     Unable to take d/t Neosporin allergy  . Neosporin [Neomycin-Bacitracin Zn-Polymyx] Rash    Medications:  Prior to Admission medications   Medication Sig Start Date End Date Taking? Authorizing Provider  acetaminophen (TYLENOL) 500 MG tablet Take 500 mg by mouth every 6 (six) hours as needed for moderate pain.    [provider]  CALCIUM-VITAMIN D PO Take 1 tablet by mouth daily.     [provider]  ferrous sulfate (FEROSUL) 325 (65 FE) MG tablet Take 1 tablet (325 mg total) by mouth 2 (two) times daily with a meal. Patient not taking: Reported on 11/10/2017 03/04/17   Colon Branch, MD  furosemide (LASIX) 20 MG tablet Take 2 tablets (40 mg total) by mouth daily. 04/05/18   Kathlene November  E, MD  ibuprofen (ADVIL,MOTRIN) 200 MG tablet Take 600 mg by mouth every 6 (six) hours as needed for moderate pain.     [provider]  levothyroxine (SYNTHROID, LEVOTHROID) 50 MCG tablet Take 1 tablet (50 mcg total) by mouth daily before breakfast. 05/08/18   Colon Branch, MD  losartan (COZAAR) 100 MG tablet Take 1 tablet (100 mg total) by mouth daily. 02/23/18   Colon Branch, MD  Multiple Vitamin (MULTIVITAMIN) capsule Take 1 capsule by mouth daily.      [provider]  nortriptyline (PAMELOR) 25 MG capsule Take 1 capsule (25 mg total) by mouth at bedtime. 01/20/18   Colon Branch, MD  oxyCODONE (ROXICODONE) 15 MG immediate release tablet Take 1 tablet (15 mg total) by mouth every 6 (six) hours as needed for pain (Must last 28 days). 11/22/17   Sater, Nanine Means, MD  pantoprazole (PROTONIX) 40 MG tablet Take 1 tablet (40 mg  total) by mouth 2 (two) times daily before a meal. 05/22/18   Paz, Alda Berthold, MD  potassium chloride SA (K-DUR,KLOR-CON) 20 MEQ tablet Take 2 tablets (40 mEq total) by mouth 2 (two) times daily. 08/22/17   Colon Branch, MD    ROS:  Out of a complete 14 system review of symptoms, the patient complains only of the following symptoms, and all other reviewed systems are negative.  Cough, choking Eye itching, eye redness, blurred vision  There were no vitals taken for this visit.  Physical Exam  General: The patient is alert and cooperative at the time of the examination.  Respiratory: Lung fields appear to be clear.  Cardiovascular: Regular rate and rhythm, no murmurs or rubs are noted.  Skin: No significant peripheral edema is noted.   Neurologic Exam  Mental status: The patient is alert and oriented x 3 at the time of the examination. The patient has apparent normal recent and remote memory, with an apparently normal attention span and concentration ability.   Cranial nerves: Facial symmetry is present. Speech is normal, no aphasia or dysarthria is noted. Extraocular movements are slow, the patient is some restriction with vertical gaze, better horizontal gaze. Visual fields are full.  Prominent masking of the face is seen.  Motor: The patient has good strength in all 4 extremities.  Sensory examination: Soft touch sensation is symmetric on the face, arms, and legs.  Coordination: The patient has good finger-nose-finger and heel-to-shin bilaterally.  Gait and station: The patient requires assistance with standing, once up, she can take a few shuffling steps with the examiner.  Reflexes: Deep tendon reflexes are symmetric.   Assessment/Plan:  1.  Progressive supranuclear palsy  2.  Chronic low back pain  3.  Dysphagia  4.  Gait disorder  The patient will be taken off of the nortriptyline capsules and switch to the liquid as she is having trouble swallowing the capsule.  The  patient will follow-up in 6 months. Her husband needs to be careful about having the food finely chopped up before she tries to swallow.  Jill Alexanders MD 06/01/2018 1:25 PM  [facility] [address] [place], [place] [address]  Phone [phone] Fax [phone]

## 2018-08-22 ENCOUNTER — Ambulatory Visit (INDEPENDENT_AMBULATORY_CARE_PROVIDER_SITE_OTHER): Payer: Medicare Other | Admitting: Internal Medicine

## 2018-08-22 ENCOUNTER — Other Ambulatory Visit: Payer: Self-pay

## 2018-08-22 DIAGNOSIS — I10 Essential (primary) hypertension: Secondary | ICD-10-CM

## 2018-08-22 DIAGNOSIS — G231 Progressive supranuclear ophthalmoplegia [Steele-Richardson-Olszewski]: Secondary | ICD-10-CM

## 2018-08-22 DIAGNOSIS — E034 Atrophy of thyroid (acquired): Secondary | ICD-10-CM | POA: Diagnosis not present

## 2018-08-22 NOTE — Progress Notes (Signed)
Subjective:    Patient ID: Emily Mccullough, female    DOB: [birth year], 73 y.o.   MRN: [id number]  DOS:  08/22/2018 Type of visit - description: Virtual Visit via Telephone Note  I connected with@ on 08/22/18 at  2:00 PM EDT by telephone and verified that I am speaking with the correct person using two identifiers.  THIS ENCOUNTER IS A VIRTUAL VISIT DUE TO COVID-19 - PATIENT WAS NOT SEEN IN THE OFFICE. PATIENT HAS CONSENTED TO VIRTUAL VISIT / TELEMEDICINE VISIT   Location of patient: home  Location of provider: office  I discussed the limitations, risks, security and privacy concerns of performing an evaluation and management service by telephone and the availability of in person appointments. I also discussed with the patient that there may be a patient responsible charge related to this service. The patient expressed understanding and agreed to proceed.   History of Present Illness: I spoke with the patient husband today who help me interview Emily Mccullough In general she is feeling well. Good med compliance.   Review of Systems  No fever chills No difficulty breathing Occasionally has cough when she eats.  Not a consistent symptom.  Past Medical History:  Diagnosis Date  . Abnormality of gait 06/25/2015  . Allergic rhinitis   . Breast CA ([place]) 1996  . Chronic back pain   . Colonic polyp   . Depression   . Dysphagia 06/25/2015  . GERD (gastroesophageal reflux disease)   . Headache(784.0)    migraines   . History of blood transfusion [facility]   [facility]  . Hoarseness   . Hyperlipidemia   . Hypertension   . Hypothyroidism   . Melanoma ([place]) 2006   in situ nose,s/p mohs surgery  . Obese    Moderate  . Osteoporosis   . Postmenopausal status   . PSP (progressive supranuclear palsy) ([place]) 12/09   neuro Dr.Willis, initially dx as parkinson, dx changed to PSP in 2010  . Severe dysarthria 12/23/2014  . SOB (shortness of breath)    s/p a negative Cardio-pulmonary stress test  08-27-04, normal PFT s 2006      Past Surgical History:  Procedure Laterality Date  . CATARACT EXTRACTION     right  . fracture arm    . MASTECTOMY    . SPINAL FUSION  10/24/07   6 screws  . TOTAL ABDOMINAL HYSTERECTOMY W/ BILATERAL SALPINGOOPHORECTOMY  1990    Social History   Socioeconomic History  . Marital status: Married    Spouse name: Not on file  . Number of children: 0  . Years of education: Not on file  . Highest education level: Not on file  Occupational History  . Occupation: disable    Employer: UNEMPLOYED  Social Needs  . Financial resource strain: Not on file  . Food insecurity:    Worry: Not on file    Inability: Not on file  . Transportation needs:    Medical: Not on file    Non-medical: Not on file  Tobacco Use  . Smoking status: Former Research scientist (life sciences)  . Smokeless tobacco: Never Used  Substance and Sexual Activity  . Alcohol use: No  . Drug use: No  . Sexual activity: Not Currently  Lifestyle  . Physical activity:    Days per week: Not on file    Minutes per session: Not on file  . Stress: Not on file  Relationships  . Social connections:    Talks on phone: Not on file  Gets together: Not on file    Attends religious service: Not on file    Active member of club or organization: Not on file    Attends meetings of clubs or organizations: Not on file    Relationship status: Not on file  . Intimate partner violence:    Fear of current or ex partner: Not on file    Emotionally abused: Not on file    Physically abused: Not on file    Forced sexual activity: Not on file  Other Topics Concern  . Not on file  Social History Narrative   Household-- pt and husband   Patient is right handed   Patient does not drink caffeine.      Allergies as of 08/22/2018      Reactions   Zostavax [zoster Vaccine Live]    Unable to take d/t Neosporin allergy   Neosporin [neomycin-bacitracin Zn-polymyx] Rash      Medication List       Accurate as of August 22, 2018  1:58 PM. Always use your most recent med list.        acetaminophen 500 MG tablet Commonly known as:  TYLENOL Take 500 mg by mouth every 6 (six) hours as needed for moderate pain.   CALCIUM-VITAMIN D PO Take 1 tablet by mouth daily.   furosemide 20 MG tablet Commonly known as:  LASIX Take 2 tablets (40 mg total) by mouth daily.   levothyroxine 50 MCG tablet Commonly known as:  SYNTHROID, LEVOTHROID Take 1 tablet (50 mcg total) by mouth daily before breakfast.   losartan 100 MG tablet Commonly known as:  COZAAR Take 1 tablet (100 mg total) by mouth daily.   multivitamin capsule Take 1 capsule by mouth daily.   nortriptyline 10 MG/5ML solution Commonly known as:  PAMELOR Take 12.5 mLs (25 mg total) by mouth at bedtime.   pantoprazole 40 MG tablet Commonly known as:  PROTONIX Take 1 tablet (40 mg total) by mouth 2 (two) times daily before a meal.   potassium chloride SA 20 MEQ tablet Commonly known as:  K-DUR,KLOR-CON Take 2 tablets (40 mEq total) by mouth 2 (two) times daily.           Objective:   Physical Exam There were no vitals taken for this visit. This is our telephone assessment, the information was provided by the husband who is the caregiver.    Assessment     Assessment HTN Hyperlipidemia Hypothyroidism Depression Osteoporosis- declined dexa 04-2014, again 07-2015 Back pain- oxycodone 2-3 qd  GERD PSP dx 2009 initially dx w/ Parkinson, dx changed to PSP 2010. Gait abnormality, dysphagia (SP eval 03-2015) Anemia: Low iron 08/06/2015, declined referral H/o Breast cancer 1996 H/o Melanoma 2006 H/o SOB: Negative cardiopulmonary stress test 2006, normal PFTs 2006  PLAN: Phone visit  HTN: Currently on furosemide, potassium supplements and Losartan.  Patient declined need to switch potassium tablets to a liquid form. Hypothyroidism: Reports good compliance with medication. Depression: Reportedly not an issue at this time. PSP: Last visit  with neurology 06/01/2018, they noted she has difficulty swallowing capsules otherwise they recommended to follow-up in 6 months. Under ideal circumstances I would bring the patient for blood work however given the COVID-19 pandemia I will recommend her to come back in 3 months, the patient will call and make an appointment, hopefully at that time we will be able to do face-to-face visit. RTC 3 months      I discussed the assessment and treatment plan  with the patient. The patient was provided an opportunity to ask questions and all were answered. The patient agreed with the plan and demonstrated an understanding of the instructions.   The patient was advised to call back or seek an in-person evaluation if the symptoms worsen or if the condition fails to improve as anticipated.  I provided 10 minutes of non-face-to-face time during this encounter.  Kathlene November, MD

## 2018-08-23 NOTE — Assessment & Plan Note (Signed)
Phone visit  HTN: Currently on furosemide, potassium supplements and Losartan.  Patient declined need to switch potassium tablets to a liquid form. Hypothyroidism: Reports good compliance with medication. Depression: Reportedly not an issue at this time. PSP: Last visit with neurology 06/01/2018, they noted she has difficulty swallowing capsules otherwise they recommended to follow-up in 6 months. Under ideal circumstances I would bring the patient for blood work however given the COVID-19 pandemia I will recommend her to come back in 3 months, the patient will call and make an appointment, hopefully at that time we will be able to do face-to-face visit. RTC 3 months

## 2018-09-09 ENCOUNTER — Other Ambulatory Visit: Payer: Self-pay | Admitting: Internal Medicine

## 2018-11-03 ENCOUNTER — Other Ambulatory Visit: Payer: Self-pay | Admitting: Internal Medicine

## 2018-11-15 ENCOUNTER — Telehealth: Payer: Self-pay | Admitting: Internal Medicine

## 2018-11-15 NOTE — Telephone Encounter (Signed)
Left msg for patient to call back concerning appt

## 2018-11-27 ENCOUNTER — Other Ambulatory Visit: Payer: Self-pay

## 2018-11-27 ENCOUNTER — Ambulatory Visit (INDEPENDENT_AMBULATORY_CARE_PROVIDER_SITE_OTHER): Payer: Medicare Other | Admitting: Internal Medicine

## 2018-11-27 DIAGNOSIS — E785 Hyperlipidemia, unspecified: Secondary | ICD-10-CM | POA: Diagnosis not present

## 2018-11-27 DIAGNOSIS — E034 Atrophy of thyroid (acquired): Secondary | ICD-10-CM | POA: Diagnosis not present

## 2018-11-27 DIAGNOSIS — I10 Essential (primary) hypertension: Secondary | ICD-10-CM

## 2018-11-27 NOTE — Progress Notes (Signed)
Subjective:    Patient ID: Emily Mccullough, female    DOB: [birth year], 73 y.o.   MRN: [id number]  DOS:  11/27/2018 Type of visit - description: Attempted  to make this a video visit, due to technical difficulties from the patient side it was not possible  thus we proceeded with a Virtual Visit via Telephone    I connected with@ on 11/28/18 at  3:20 PM EDT by telephone and verified that I am speaking with the correct person using two identifiers.  THIS ENCOUNTER IS A VIRTUAL VISIT DUE TO COVID-19 - PATIENT WAS NOT SEEN IN THE OFFICE. PATIENT HAS CONSENTED TO VIRTUAL VISIT / TELEMEDICINE VISIT   Location of patient: home  Location of provider: office  I discussed the limitations, risks, security and privacy concerns of performing an evaluation and management service by telephone and the availability of in person appointments. I also discussed with the patient that there may be a patient responsible charge related to this service. The patient expressed understanding and agreed to proceed.   History of Present Illness: Routine visit This visit is worse conducted virtually over the phone, her husband did all the communication for [place]. She reports that he is feeling well, no new concerns or complaints.  No recent falls  BP Readings from Last 3 Encounters:  06/01/18 104/62  02/20/18 126/64  11/10/17 116/64     Review of Systems No fever chills No nausea, vomiting, diarrhea. Continue with cough mostly when she eats, at baseline  Past Medical History:  Diagnosis Date  . Abnormality of gait 06/25/2015  . Allergic rhinitis   . Breast CA ([place]) 1996  . Chronic back pain   . Colonic polyp   . Depression   . Dysphagia 06/25/2015  . GERD (gastroesophageal reflux disease)   . Headache(784.0)    migraines   . History of blood transfusion 2009   [facility]  . Hoarseness   . Hyperlipidemia   . Hypertension   . Hypothyroidism   . Melanoma ([place]) 2006   in situ nose,s/p mohs surgery   . Obese    Moderate  . Osteoporosis   . Postmenopausal status   . PSP (progressive supranuclear palsy) ([place]) 12/09   neuro Dr.Willis, initially dx as parkinson, dx changed to PSP in 2010  . Severe dysarthria 12/23/2014  . SOB (shortness of breath)    s/p a negative Cardio-pulmonary stress test 08-27-04, normal PFT s 2006      Past Surgical History:  Procedure Laterality Date  . CATARACT EXTRACTION     right  . fracture arm    . MASTECTOMY    . SPINAL FUSION  10/24/07   6 screws  . TOTAL ABDOMINAL HYSTERECTOMY W/ BILATERAL SALPINGOOPHORECTOMY  1990    Social History   Socioeconomic History  . Marital status: Married    Spouse name: Not on file  . Number of children: 0  . Years of education: Not on file  . Highest education level: Not on file  Occupational History  . Occupation: disable    Employer: UNEMPLOYED  Social Needs  . Financial resource strain: Not on file  . Food insecurity    Worry: Not on file    Inability: Not on file  . Transportation needs    Medical: Not on file    Non-medical: Not on file  Tobacco Use  . Smoking status: Former Research scientist (life sciences)  . Smokeless tobacco: Never Used  Substance and Sexual Activity  . Alcohol use: No  .  Drug use: No  . Sexual activity: Not Currently  Lifestyle  . Physical activity    Days per week: Not on file    Minutes per session: Not on file  . Stress: Not on file  Relationships  . Social Herbalist on phone: Not on file    Gets together: Not on file    Attends religious service: Not on file    Active member of club or organization: Not on file    Attends meetings of clubs or organizations: Not on file    Relationship status: Not on file  . Intimate partner violence    Fear of current or ex partner: Not on file    Emotionally abused: Not on file    Physically abused: Not on file    Forced sexual activity: Not on file  Other Topics Concern  . Not on file  Social History Narrative   Household-- pt and husband    Patient is right handed   Patient does not drink caffeine.      Allergies as of 11/27/2018      Reactions   Zostavax [zoster Vaccine Live]    Unable to take d/t Neosporin allergy   Neosporin [neomycin-bacitracin Zn-polymyx] Rash      Medication List       Accurate as of November 27, 2018 11:59 PM. If you have any questions, ask your nurse or doctor.        acetaminophen 500 MG tablet Commonly known as: TYLENOL Take 500 mg by mouth every 6 (six) hours as needed for moderate pain.   CALCIUM-VITAMIN D PO Take 1 tablet by mouth daily.   furosemide 20 MG tablet Commonly known as: LASIX Take 2 tablets (40 mg total) by mouth daily.   levothyroxine 50 MCG tablet Commonly known as: SYNTHROID Take 1 tablet (50 mcg total) by mouth daily before breakfast.   losartan 100 MG tablet Commonly known as: COZAAR Take 1 tablet (100 mg total) by mouth daily.   multivitamin capsule Take 1 capsule by mouth daily.   nortriptyline 10 MG/5ML solution Commonly known as: PAMELOR Take 12.5 mLs (25 mg total) by mouth at bedtime.   pantoprazole 40 MG tablet Commonly known as: PROTONIX Take 1 tablet (40 mg total) by mouth 2 (two) times daily before a meal.   potassium chloride SA 20 MEQ tablet Commonly known as: K-DUR Take 2 tablets (40 mEq total) by mouth 2 (two) times daily.           Objective:   Physical Exam There were no vitals taken for this visit. This is a virtual phone visit, the conversation was carried out with the patient's husband    Assessment      Assessment HTN Hyperlipidemia Hypothyroidism Depression Osteoporosis- declined dexa 04-2014, again 07-2015 Back pain- oxycodone 2-3 qd  GERD PSP dx 2009 initially dx w/ Parkinson, dx changed to PSP 2010. Gait abnormality, dysphagia (SP eval 03-2015) Anemia: Low iron 08/06/2015, declined referral H/o Breast cancer 1996 H/o Melanoma 2006 H/o SOB: Negative cardiopulmonary stress test 2006, normal PFTs 2006  PLAN:  Phone visit  HTN: No recent ambulatory BPs, continue Lasix, losartan, potassium.  Check a CMP Hyperlipidemia: Diet controlled, patient reports that she does like to check a FLP.  Will do. Hypothyroidism: On Synthroid, check a TSH Will call and schedule labs RTC hopefully in person in 4 months       I discussed the assessment and treatment plan with the patient. The patient  was provided an opportunity to ask questions and all were answered. The patient agreed with the plan and demonstrated an understanding of the instructions.   The patient was advised to call back or seek an in-person evaluation if the symptoms worsen or if the condition fails to improve as anticipated.  I provided 18 minutes of non-face-to-face time during this encounter.  Kathlene November, MD

## 2018-11-28 NOTE — Assessment & Plan Note (Signed)
Phone visit  HTN: No recent ambulatory BPs, continue Lasix, losartan, potassium.  Check a CMP Hyperlipidemia: Diet controlled, patient reports that she does like to check a FLP.  Will do. Hypothyroidism: On Synthroid, check a TSH Will call and schedule labs RTC hopefully in person in 4 months

## 2018-12-04 ENCOUNTER — Other Ambulatory Visit: Payer: Self-pay | Admitting: Internal Medicine

## 2018-12-07 ENCOUNTER — Ambulatory Visit (INDEPENDENT_AMBULATORY_CARE_PROVIDER_SITE_OTHER): Payer: Medicare Other | Admitting: Neurology

## 2018-12-07 ENCOUNTER — Encounter: Payer: Self-pay | Admitting: Neurology

## 2018-12-07 ENCOUNTER — Other Ambulatory Visit: Payer: Self-pay

## 2018-12-07 VITALS — BP 130/90 | HR 68 | Temp 96.4°F

## 2018-12-07 DIAGNOSIS — G231 Progressive supranuclear ophthalmoplegia [Steele-Richardson-Olszewski]: Secondary | ICD-10-CM | POA: Diagnosis not present

## 2018-12-07 DIAGNOSIS — R269 Unspecified abnormalities of gait and mobility: Secondary | ICD-10-CM

## 2018-12-07 NOTE — Progress Notes (Signed)
Reason for visit: PSP  Emily Mccullough is an 73 y.o. female  History of present illness:  Emily Mccullough is a 73 year old right-handed white female with a history of progressive supranuclear palsy.  The patient has continued to have difficulty with speech and with swallowing, she will choke on occasion.  She has not had any falls, she can stand for transfers.  She is able to feed herself, but she requires assistance with bathing and dressing.  She lives at home with her husband.  She is off of her opiate medications for her back pain.  She has not had any significant changes in her clinical condition since last seen.  Past Medical History:  Diagnosis Date  . Abnormality of gait 06/25/2015  . Allergic rhinitis   . Breast CA ([place]) 1996  . Chronic back pain   . Colonic polyp   . Depression   . Dysphagia 06/25/2015  . GERD (gastroesophageal reflux disease)   . Headache(784.0)    migraines   . History of blood transfusion 2009   [facility]  . Hoarseness   . Hyperlipidemia   . Hypertension   . Hypothyroidism   . Melanoma ([place]) 2006   in situ nose,s/p mohs surgery  . Obese    Moderate  . Osteoporosis   . Postmenopausal status   . PSP (progressive supranuclear palsy) ([place]) 12/09   neuro Dr.Exander Shaul, initially dx as parkinson, dx changed to PSP in 2010  . Severe dysarthria 12/23/2014  . SOB (shortness of breath)    s/p a negative Cardio-pulmonary stress test 08-27-04, normal PFT s 2006      Past Surgical History:  Procedure Laterality Date  . CATARACT EXTRACTION     right  . fracture arm    . MASTECTOMY    . SPINAL FUSION  10/24/07   6 screws  . TOTAL ABDOMINAL HYSTERECTOMY W/ BILATERAL SALPINGOOPHORECTOMY  1990    Family History  Problem Relation Age of Onset  . Coronary artery disease Father        MI at age 36  . Emphysema Father   . Colon cancer Father        age  . Hypertension Father   . Diabetes Mother   . Hypertension Mother   . Stroke Unknown        GF  .  Breast cancer Maternal Aunt     Social history:  reports that she has quit smoking. She has never used smokeless tobacco. She reports that she does not drink alcohol or use drugs.    Allergies  Allergen Reactions  . Zostavax [Zoster Vaccine Live]     Unable to take d/t Neosporin allergy  . Neosporin [Neomycin-Bacitracin Zn-Polymyx] Rash    Medications:  Prior to Admission medications   Medication Sig Start Date End Date Taking? Authorizing Provider  acetaminophen (TYLENOL) 500 MG tablet Take 500 mg by mouth every 6 (six) hours as needed for moderate pain.   Yes [provider]  CALCIUM-VITAMIN D PO Take 1 tablet by mouth daily.    Yes [provider]  furosemide (LASIX) 20 MG tablet Take 2 tablets (40 mg total) by mouth daily. 04/05/18  Yes Colon Branch, MD  levothyroxine (SYNTHROID) 50 MCG tablet Take 1 tablet (50 mcg total) by mouth daily before breakfast. 11/03/18  Yes Paz, Alda Berthold, MD  losartan (COZAAR) 100 MG tablet Take 1 tablet (100 mg total) by mouth daily. 12/04/18  Yes Colon Branch, MD  Multiple  Vitamin (MULTIVITAMIN) capsule Take 1 capsule by mouth daily.     Yes [provider]  nortriptyline (PAMELOR) 25 MG capsule  11/12/18  Yes [provider]  pantoprazole (PROTONIX) 40 MG tablet Take 1 tablet (40 mg total) by mouth 2 (two) times daily before a meal. 05/22/18  Yes Paz, Jacqulyn Bath E, MD  potassium chloride SA (K-DUR) 20 MEQ tablet Take 2 tablets (40 mEq total) by mouth 2 (two) times daily. 09/11/18  Yes Paz, Alda Berthold, MD    ROS:  Out of a complete 14 system review of symptoms, the patient complains only of the following symptoms, and all other reviewed systems are negative.  Walking problems Choking  Blood pressure 130/90, pulse 68, temperature (!) 96.4 F (35.8 C).  Physical Exam  General: The patient is alert and cooperative at the time of the examination.  Skin: No significant peripheral edema is noted.   Neurologic Exam  Mental  status: The patient is alert and oriented x 3 at the time of the examination. The patient has apparent normal recent and remote memory, with an apparently normal attention span and concentration ability.   Cranial nerves: Facial symmetry is present. Speech is low amplitude, but very dysarthric, almost unintelligible. Extraocular movements are notable for slowed tracking movements in the horizontal plane, the patient has restriction with superior and inferior gaze, eyes appear to be wide open, minimal blinking. Visual fields are full.  Motor: The patient has good strength in all 4 extremities.  Sensory examination: Soft touch sensation is symmetric on the face, arms, and legs.  Coordination: The patient has good finger-nose-finger and heel-to-shin bilaterally.  Gait and station: The patient can stand with assistance.  The patient is unable to ambulate.  Reflexes: Deep tendon reflexes are symmetric.   Assessment/Plan:  1.  Progressive supranuclear palsy  2.  Dysphagia  3.  Gait disturbance  The patient continues to progress slowly with her disease.  The swallowing issue will need to be followed closely, the patient may require a swallowing evaluation in the future.  She will follow-up here in 8 months.  Jill Alexanders MD 12/07/2018 1:55 PM  [facility] [address] [place], [place] [address]  Phone [phone] Fax [phone]

## 2018-12-13 ENCOUNTER — Ambulatory Visit (INDEPENDENT_AMBULATORY_CARE_PROVIDER_SITE_OTHER): Payer: Medicare Other | Admitting: *Deleted

## 2018-12-13 ENCOUNTER — Other Ambulatory Visit: Payer: Self-pay

## 2018-12-13 ENCOUNTER — Other Ambulatory Visit (INDEPENDENT_AMBULATORY_CARE_PROVIDER_SITE_OTHER): Payer: Medicare Other

## 2018-12-13 DIAGNOSIS — I10 Essential (primary) hypertension: Secondary | ICD-10-CM | POA: Diagnosis not present

## 2018-12-13 DIAGNOSIS — E034 Atrophy of thyroid (acquired): Secondary | ICD-10-CM | POA: Diagnosis not present

## 2018-12-13 DIAGNOSIS — E785 Hyperlipidemia, unspecified: Secondary | ICD-10-CM

## 2018-12-13 DIAGNOSIS — Z23 Encounter for immunization: Secondary | ICD-10-CM

## 2018-12-13 LAB — LIPID PANEL
Cholesterol: 211 mg/dL — ABNORMAL HIGH (ref 0–200)
HDL: 69.5 mg/dL (ref >39.00)
LDL Cholesterol: 117 mg/dL — ABNORMAL HIGH (ref 0–99)
NonHDL: 141.68
Total CHOL/HDL Ratio: 3
Triglycerides: 123 mg/dL (ref 0.0–149.0)
VLDL: 24.6 mg/dL (ref 0.0–40.0)

## 2018-12-13 LAB — COMPREHENSIVE METABOLIC PANEL
ALT: 9 U/L (ref 0–35)
AST: 12 U/L (ref 0–37)
Albumin: 4.2 g/dL (ref 3.5–5.2)
Alkaline Phosphatase: 67 U/L (ref 39–117)
BUN: 17 mg/dL (ref 6–23)
CO2: 30 mEq/L (ref 19–32)
Calcium: 9.3 mg/dL (ref 8.4–10.5)
Chloride: 105 mEq/L (ref 96–112)
Creatinine, Ser: 0.67 mg/dL (ref 0.40–1.20)
GFR: 86.05 mL/min (ref >60.00)
Glucose, Bld: 62 mg/dL — ABNORMAL LOW (ref 70–99)
Potassium: 3.7 mEq/L (ref 3.5–5.1)
Sodium: 143 mEq/L (ref 135–145)
Total Bilirubin: 0.5 mg/dL (ref 0.2–1.2)
Total Protein: 6.7 g/dL (ref 6.0–8.3)

## 2018-12-13 LAB — TSH: TSH: 1.52 u[IU]/mL (ref 0.35–4.50)

## 2018-12-13 NOTE — Progress Notes (Signed)
Patient her for pneumovax vaccine per Dr. Larose Kells.  Vaccine given and patient tolerated well.

## 2019-01-06 ENCOUNTER — Other Ambulatory Visit: Payer: Self-pay | Admitting: Internal Medicine

## 2019-02-05 ENCOUNTER — Telehealth: Payer: Self-pay | Admitting: Internal Medicine

## 2019-02-05 NOTE — Telephone Encounter (Signed)
Please proceed.

## 2019-02-05 NOTE — Telephone Encounter (Signed)
Spoke w/ Manus Gunning- verbal given for Hospice.

## 2019-02-05 NOTE — Telephone Encounter (Signed)
Please advise 

## 2019-02-05 NOTE — Telephone Encounter (Signed)
Manus Gunning calling from [facility] called and stated that pt would need a referral for hospice. Please advise   [phone]

## 2019-02-06 DIAGNOSIS — R634 Abnormal weight loss: Secondary | ICD-10-CM | POA: Diagnosis not present

## 2019-02-06 DIAGNOSIS — G231 Progressive supranuclear ophthalmoplegia [Steele-Richardson-Olszewski]: Secondary | ICD-10-CM | POA: Diagnosis not present

## 2019-02-06 DIAGNOSIS — Z853 Personal history of malignant neoplasm of breast: Secondary | ICD-10-CM | POA: Diagnosis not present

## 2019-02-06 DIAGNOSIS — M810 Age-related osteoporosis without current pathological fracture: Secondary | ICD-10-CM | POA: Diagnosis not present

## 2019-02-06 DIAGNOSIS — R1310 Dysphagia, unspecified: Secondary | ICD-10-CM | POA: Diagnosis not present

## 2019-02-06 DIAGNOSIS — E039 Hypothyroidism, unspecified: Secondary | ICD-10-CM | POA: Diagnosis not present

## 2019-02-06 DIAGNOSIS — I10 Essential (primary) hypertension: Secondary | ICD-10-CM | POA: Diagnosis not present

## 2019-02-07 DIAGNOSIS — R634 Abnormal weight loss: Secondary | ICD-10-CM | POA: Diagnosis not present

## 2019-02-07 DIAGNOSIS — I10 Essential (primary) hypertension: Secondary | ICD-10-CM | POA: Diagnosis not present

## 2019-02-07 DIAGNOSIS — E039 Hypothyroidism, unspecified: Secondary | ICD-10-CM | POA: Diagnosis not present

## 2019-02-07 DIAGNOSIS — R1310 Dysphagia, unspecified: Secondary | ICD-10-CM | POA: Diagnosis not present

## 2019-02-07 DIAGNOSIS — Z853 Personal history of malignant neoplasm of breast: Secondary | ICD-10-CM | POA: Diagnosis not present

## 2019-02-07 DIAGNOSIS — G231 Progressive supranuclear ophthalmoplegia [Steele-Richardson-Olszewski]: Secondary | ICD-10-CM | POA: Diagnosis not present

## 2019-02-09 DIAGNOSIS — R634 Abnormal weight loss: Secondary | ICD-10-CM | POA: Diagnosis not present

## 2019-02-09 DIAGNOSIS — G231 Progressive supranuclear ophthalmoplegia [Steele-Richardson-Olszewski]: Secondary | ICD-10-CM | POA: Diagnosis not present

## 2019-02-09 DIAGNOSIS — E039 Hypothyroidism, unspecified: Secondary | ICD-10-CM | POA: Diagnosis not present

## 2019-02-09 DIAGNOSIS — R1310 Dysphagia, unspecified: Secondary | ICD-10-CM | POA: Diagnosis not present

## 2019-02-09 DIAGNOSIS — Z853 Personal history of malignant neoplasm of breast: Secondary | ICD-10-CM | POA: Diagnosis not present

## 2019-02-09 DIAGNOSIS — I10 Essential (primary) hypertension: Secondary | ICD-10-CM | POA: Diagnosis not present

## 2019-02-11 DIAGNOSIS — Z853 Personal history of malignant neoplasm of breast: Secondary | ICD-10-CM | POA: Diagnosis not present

## 2019-02-11 DIAGNOSIS — R634 Abnormal weight loss: Secondary | ICD-10-CM | POA: Diagnosis not present

## 2019-02-11 DIAGNOSIS — R1310 Dysphagia, unspecified: Secondary | ICD-10-CM | POA: Diagnosis not present

## 2019-02-11 DIAGNOSIS — E039 Hypothyroidism, unspecified: Secondary | ICD-10-CM | POA: Diagnosis not present

## 2019-02-11 DIAGNOSIS — G231 Progressive supranuclear ophthalmoplegia [Steele-Richardson-Olszewski]: Secondary | ICD-10-CM | POA: Diagnosis not present

## 2019-02-11 DIAGNOSIS — I10 Essential (primary) hypertension: Secondary | ICD-10-CM | POA: Diagnosis not present

## 2019-02-12 ENCOUNTER — Telehealth: Payer: Self-pay | Admitting: Internal Medicine

## 2019-02-12 DIAGNOSIS — G231 Progressive supranuclear ophthalmoplegia [Steele-Richardson-Olszewski]: Secondary | ICD-10-CM | POA: Diagnosis not present

## 2019-02-12 DIAGNOSIS — Z853 Personal history of malignant neoplasm of breast: Secondary | ICD-10-CM | POA: Diagnosis not present

## 2019-02-12 DIAGNOSIS — R1310 Dysphagia, unspecified: Secondary | ICD-10-CM | POA: Diagnosis not present

## 2019-02-12 DIAGNOSIS — I10 Essential (primary) hypertension: Secondary | ICD-10-CM | POA: Diagnosis not present

## 2019-02-12 DIAGNOSIS — E039 Hypothyroidism, unspecified: Secondary | ICD-10-CM | POA: Diagnosis not present

## 2019-02-12 DIAGNOSIS — R634 Abnormal weight loss: Secondary | ICD-10-CM | POA: Diagnosis not present

## 2019-02-12 NOTE — Telephone Encounter (Signed)
Informed Emily Mccullough's brother that letter is at front desk for pick up.

## 2019-02-12 NOTE — Telephone Encounter (Signed)
-----   Message from Colon Branch, MD sent at 02/06/2019  3:30 PM EDT ----- Regarding: Needs a letter  ----- Message -----      Skeet Latch      Sent:02/05/2019  8:54 PM EDT        [id number] Larose Kells, MD   [place] Question  Thank you for agreeing to have Ivin Booty evaluated for Hospice care.  Rudine and Jackson's finances are at a minimal level.  She has a small amount in her 401K and wants me to access those funds if needed for medical or domestic care.  I have her POA and also her Power of Health.  In order for me to be able to draw on her account, I must submit the POA, a fiduciary form and a letter of incapacitation from you on your letterhead.  I have two of those documents.  Would you prepare that letter and have it for me on Thursday when I come for my annual physical?  You know how much I love my sister and I would only use those funds for her well being.

## 2019-02-12 NOTE — Telephone Encounter (Signed)
See note from the patient's brother, will get a letter ready

## 2019-02-14 DIAGNOSIS — Z853 Personal history of malignant neoplasm of breast: Secondary | ICD-10-CM | POA: Diagnosis not present

## 2019-02-14 DIAGNOSIS — G231 Progressive supranuclear ophthalmoplegia [Steele-Richardson-Olszewski]: Secondary | ICD-10-CM | POA: Diagnosis not present

## 2019-02-14 DIAGNOSIS — R1310 Dysphagia, unspecified: Secondary | ICD-10-CM | POA: Diagnosis not present

## 2019-02-14 DIAGNOSIS — R634 Abnormal weight loss: Secondary | ICD-10-CM | POA: Diagnosis not present

## 2019-02-14 DIAGNOSIS — E039 Hypothyroidism, unspecified: Secondary | ICD-10-CM | POA: Diagnosis not present

## 2019-02-14 DIAGNOSIS — I10 Essential (primary) hypertension: Secondary | ICD-10-CM | POA: Diagnosis not present

## 2019-02-15 ENCOUNTER — Emergency Department (HOSPITAL_COMMUNITY)

## 2019-02-15 ENCOUNTER — Emergency Department (HOSPITAL_COMMUNITY)
Admission: EM | Admit: 2019-02-15 | Discharge: 2019-02-15 | Disposition: A | Discharge status: Home / Self Care | Attending: Emergency Medicine | Admitting: Emergency Medicine

## 2019-02-15 ENCOUNTER — Encounter (HOSPITAL_COMMUNITY): Payer: Self-pay

## 2019-02-15 ENCOUNTER — Other Ambulatory Visit: Payer: Self-pay

## 2019-02-15 DIAGNOSIS — W06XXXA Fall from bed, initial encounter: Secondary | ICD-10-CM | POA: Insufficient documentation

## 2019-02-15 DIAGNOSIS — R634 Abnormal weight loss: Secondary | ICD-10-CM | POA: Diagnosis not present

## 2019-02-15 DIAGNOSIS — Y999 Unspecified external cause status: Secondary | ICD-10-CM | POA: Insufficient documentation

## 2019-02-15 DIAGNOSIS — R1310 Dysphagia, unspecified: Secondary | ICD-10-CM | POA: Diagnosis not present

## 2019-02-15 DIAGNOSIS — W19XXXA Unspecified fall, initial encounter: Secondary | ICD-10-CM | POA: Diagnosis not present

## 2019-02-15 DIAGNOSIS — S40012A Contusion of left shoulder, initial encounter: Secondary | ICD-10-CM | POA: Insufficient documentation

## 2019-02-15 DIAGNOSIS — Y939 Activity, unspecified: Secondary | ICD-10-CM | POA: Diagnosis not present

## 2019-02-15 DIAGNOSIS — E039 Hypothyroidism, unspecified: Secondary | ICD-10-CM | POA: Diagnosis not present

## 2019-02-15 DIAGNOSIS — Z7401 Bed confinement status: Secondary | ICD-10-CM | POA: Diagnosis not present

## 2019-02-15 DIAGNOSIS — I10 Essential (primary) hypertension: Secondary | ICD-10-CM | POA: Diagnosis not present

## 2019-02-15 DIAGNOSIS — S4982XA Other specified injuries of left shoulder and upper arm, initial encounter: Secondary | ICD-10-CM | POA: Diagnosis present

## 2019-02-15 DIAGNOSIS — Z87891 Personal history of nicotine dependence: Secondary | ICD-10-CM | POA: Insufficient documentation

## 2019-02-15 DIAGNOSIS — R410 Disorientation, unspecified: Secondary | ICD-10-CM | POA: Diagnosis not present

## 2019-02-15 DIAGNOSIS — M2550 Pain in unspecified joint: Secondary | ICD-10-CM | POA: Diagnosis not present

## 2019-02-15 DIAGNOSIS — Z853 Personal history of malignant neoplasm of breast: Secondary | ICD-10-CM | POA: Diagnosis not present

## 2019-02-15 DIAGNOSIS — G231 Progressive supranuclear ophthalmoplegia [Steele-Richardson-Olszewski]: Secondary | ICD-10-CM | POA: Diagnosis not present

## 2019-02-15 DIAGNOSIS — Z79899 Other long term (current) drug therapy: Secondary | ICD-10-CM | POA: Insufficient documentation

## 2019-02-15 DIAGNOSIS — S4992XA Unspecified injury of left shoulder and upper arm, initial encounter: Secondary | ICD-10-CM | POA: Diagnosis not present

## 2019-02-15 DIAGNOSIS — R531 Weakness: Secondary | ICD-10-CM | POA: Diagnosis not present

## 2019-02-15 DIAGNOSIS — Y92099 Unspecified place in other non-institutional residence as the place of occurrence of the external cause: Secondary | ICD-10-CM | POA: Diagnosis not present

## 2019-02-15 DIAGNOSIS — M810 Age-related osteoporosis without current pathological fracture: Secondary | ICD-10-CM | POA: Diagnosis not present

## 2019-02-15 DIAGNOSIS — R52 Pain, unspecified: Secondary | ICD-10-CM | POA: Diagnosis not present

## 2019-02-15 NOTE — ED Notes (Signed)
Patient transported to x-ray. ?

## 2019-02-15 NOTE — ED Triage Notes (Addendum)
Pt arrives PTAR from home for left shoulder injury after falling out of her bed on to the carpet. Pt is nonverbal, but nods appropriately to questions about her injury.

## 2019-02-15 NOTE — ED Provider Notes (Signed)
[facility] DEPT Provider Note   CSN: [id number] Arrival date & time: 02/15/19  1125     History   Chief Complaint Chief Complaint  Patient presents with  . Shoulder Injury    Fall    HPI Emily Mccullough is a 73 y.o. female.     73 year old female presents with home after rolling out of the bed and falling onto her left shoulder.  No reported LOC.  She does not take any blood thinners at this time.  Complains of dull left shoulder pain which is worse with movement.  No other injuries noted     Past Medical History:  Diagnosis Date  . Abnormality of gait 06/25/2015  . Allergic rhinitis   . Breast CA ([place]) 1996  . Chronic back pain   . Colonic polyp   . Depression   . Dysphagia 06/25/2015  . GERD (gastroesophageal reflux disease)   . Headache(784.0)    migraines   . History of blood transfusion 2009   [facility]  . Hoarseness   . Hyperlipidemia   . Hypertension   . Hypothyroidism   . Melanoma ([place]) 2006   in situ nose,s/p mohs surgery  . Obese    Moderate  . Osteoporosis   . Postmenopausal status   . PSP (progressive supranuclear palsy) ([place]) 12/09   neuro Dr.Willis, initially dx as parkinson, dx changed to PSP in 2010  . Severe dysarthria 12/23/2014  . SOB (shortness of breath)    s/p a negative Cardio-pulmonary stress test 08-27-04, normal PFT s 2006      Patient Active Problem List   Diagnosis Date Noted  . Abnormality of gait 06/25/2015  . Dysphagia 06/25/2015  . Hypokalemia 04/14/2015  . PCP NOTES >>>>>> 04/08/2015  . Severe dysarthria 12/23/2014  . Annual physical exam 03/06/2013  . Anemia 03/06/2013  . HIP PAIN, LEFT 05/28/2009  . PSP (progressive supranuclear palsy) ([place]) 07/05/2008  . ALLERGIC RHINITIS 02/29/2008  . BREAST CANCER, HX OF 10/13/2007  . Essential hypertension 05/10/202008  . Backache 05/10/202008  . Hypothyroidism 12/27/2006  . Hyperlipidemia 10/03/2006  . Depression 10/03/2006  . GERD 10/03/2006  .  POSTMENOPAUSAL STATUS 10/03/2006  . Osteoporosis 10/03/2006  . COLONIC POLYPS, HX OF 10/03/2006    Past Surgical History:  Procedure Laterality Date  . CATARACT EXTRACTION     right  . fracture arm    . MASTECTOMY    . SPINAL FUSION  10/24/07   6 screws  . TOTAL ABDOMINAL HYSTERECTOMY W/ BILATERAL SALPINGOOPHORECTOMY  1990     OB History   No obstetric history on file.      Home Medications    Prior to Admission medications   Medication Sig Start Date End Date Taking? Authorizing Provider  acetaminophen (TYLENOL) 500 MG tablet Take 500 mg by mouth every 6 (six) hours as needed for moderate pain.    [provider]  CALCIUM-VITAMIN D PO Take 1 tablet by mouth daily.     [provider]  furosemide (LASIX) 20 MG tablet Take 2 tablets (40 mg total) by mouth daily. 01/08/19   Colon Branch, MD  levothyroxine (SYNTHROID) 50 MCG tablet Take 1 tablet (50 mcg total) by mouth daily before breakfast. 11/03/18   Colon Branch, MD  losartan (COZAAR) 100 MG tablet Take 1 tablet (100 mg total) by mouth daily. 12/04/18   Colon Branch, MD  Multiple Vitamin (MULTIVITAMIN) capsule Take 1 capsule by mouth daily.  [provider]  nortriptyline (PAMELOR) 25 MG capsule  11/12/18   [provider]  pantoprazole (PROTONIX) 40 MG tablet Take 1 tablet (40 mg total) by mouth 2 (two) times daily before a meal. 05/22/18   Paz, Alda Berthold, MD  potassium chloride SA (K-DUR) 20 MEQ tablet Take 2 tablets (40 mEq total) by mouth 2 (two) times daily. 09/11/18   Colon Branch, MD    Family History Family History  Problem Relation Age of Onset  . Coronary artery disease Father        MI at age 21  . Emphysema Father   . Colon cancer Father        age  . Hypertension Father   . Diabetes Mother   . Hypertension Mother   . Stroke Other        GF  . Breast cancer Maternal Aunt     Social History Social History   Tobacco Use  . Smoking status: Former Research scientist (life sciences)  . Smokeless tobacco:  Never Used  Substance Use Topics  . Alcohol use: No  . Drug use: No     Allergies   Zostavax [zoster vaccine live] and Neosporin [neomycin-bacitracin zn-polymyx]   Review of Systems Review of Systems  Unable to perform ROS: Acuity of condition     Physical Exam Updated Vital Signs BP 125/69 (BP Location: Right Arm)   Pulse 73   Temp 98.3 F (36.8 C) (Oral)   Resp 16   SpO2 96%   Physical Exam Vitals signs and nursing note reviewed.  Constitutional:      General: She is not in acute distress.    Appearance: Normal appearance. She is well-developed. She is not toxic-appearing.  HENT:     Head: Normocephalic and atraumatic.  Eyes:     General: Lids are normal.     Conjunctiva/sclera: Conjunctivae normal.     Pupils: Pupils are equal, round, and reactive to light.  Neck:     Musculoskeletal: Normal range of motion and neck supple.     Thyroid: No thyroid mass.     Trachea: No tracheal deviation.  Cardiovascular:     Rate and Rhythm: Normal rate and regular rhythm.     Heart sounds: Normal heart sounds. No murmur. No gallop.   Pulmonary:     Effort: Pulmonary effort is normal. No respiratory distress.     Breath sounds: Normal breath sounds. No stridor. No decreased breath sounds, wheezing, rhonchi or rales.  Abdominal:     General: Bowel sounds are normal. There is no distension.     Palpations: Abdomen is soft.     Tenderness: There is no abdominal tenderness. There is no rebound.  Musculoskeletal:        General: No tenderness.     Left shoulder: She exhibits decreased range of motion, bony tenderness and pain. She exhibits no swelling, no deformity and no laceration.  Skin:    General: Skin is warm and dry.     Findings: No abrasion or rash.  Neurological:     Mental Status: She is alert. Mental status is at baseline. She is disoriented.     GCS: GCS eye subscore is 4. GCS verbal subscore is 5. GCS motor subscore is 6.     Cranial Nerves: No cranial nerve  deficit.     Comments: Withdraws to pain in all 4 extremities  Psychiatric:        Attention and Perception: She is inattentive.  ED Treatments / Results  Labs (all labs ordered are listed, but only abnormal results are displayed) Labs Reviewed - No data to display  EKG None  Radiology No results found.  Procedures Procedures (including critical care time)  Medications Ordered in ED Medications - No data to display   Initial Impression / Assessment and Plan / ED Course  I have reviewed the triage vital signs and the nursing notes.  Pertinent labs & imaging results that were available during my care of the patient were reviewed by me and considered in my medical decision making (see chart for details).        X-ray of left shoulder was negative.  She is not tender along her rib cage.  Stable for discharge  Final Clinical Impressions(s) / ED Diagnoses   Final diagnoses:  None    ED Discharge Orders    None       Lacretia Leigh, MD 02/15/19 1346

## 2019-02-16 DIAGNOSIS — I10 Essential (primary) hypertension: Secondary | ICD-10-CM | POA: Diagnosis not present

## 2019-02-16 DIAGNOSIS — G231 Progressive supranuclear ophthalmoplegia [Steele-Richardson-Olszewski]: Secondary | ICD-10-CM | POA: Diagnosis not present

## 2019-02-16 DIAGNOSIS — Z853 Personal history of malignant neoplasm of breast: Secondary | ICD-10-CM | POA: Diagnosis not present

## 2019-02-16 DIAGNOSIS — R1310 Dysphagia, unspecified: Secondary | ICD-10-CM | POA: Diagnosis not present

## 2019-02-16 DIAGNOSIS — R634 Abnormal weight loss: Secondary | ICD-10-CM | POA: Diagnosis not present

## 2019-02-16 DIAGNOSIS — E039 Hypothyroidism, unspecified: Secondary | ICD-10-CM | POA: Diagnosis not present

## 2019-02-17 DIAGNOSIS — R634 Abnormal weight loss: Secondary | ICD-10-CM | POA: Diagnosis not present

## 2019-02-17 DIAGNOSIS — Z853 Personal history of malignant neoplasm of breast: Secondary | ICD-10-CM | POA: Diagnosis not present

## 2019-02-17 DIAGNOSIS — R1310 Dysphagia, unspecified: Secondary | ICD-10-CM | POA: Diagnosis not present

## 2019-02-17 DIAGNOSIS — I10 Essential (primary) hypertension: Secondary | ICD-10-CM | POA: Diagnosis not present

## 2019-02-17 DIAGNOSIS — E039 Hypothyroidism, unspecified: Secondary | ICD-10-CM | POA: Diagnosis not present

## 2019-02-17 DIAGNOSIS — G231 Progressive supranuclear ophthalmoplegia [Steele-Richardson-Olszewski]: Secondary | ICD-10-CM | POA: Diagnosis not present

## 2019-02-18 DIAGNOSIS — Z853 Personal history of malignant neoplasm of breast: Secondary | ICD-10-CM | POA: Diagnosis not present

## 2019-02-18 DIAGNOSIS — G231 Progressive supranuclear ophthalmoplegia [Steele-Richardson-Olszewski]: Secondary | ICD-10-CM | POA: Diagnosis not present

## 2019-02-18 DIAGNOSIS — E039 Hypothyroidism, unspecified: Secondary | ICD-10-CM | POA: Diagnosis not present

## 2019-02-18 DIAGNOSIS — I10 Essential (primary) hypertension: Secondary | ICD-10-CM | POA: Diagnosis not present

## 2019-02-18 DIAGNOSIS — R1310 Dysphagia, unspecified: Secondary | ICD-10-CM | POA: Diagnosis not present

## 2019-02-18 DIAGNOSIS — R634 Abnormal weight loss: Secondary | ICD-10-CM | POA: Diagnosis not present

## 2019-02-19 DIAGNOSIS — R1310 Dysphagia, unspecified: Secondary | ICD-10-CM | POA: Diagnosis not present

## 2019-02-19 DIAGNOSIS — E039 Hypothyroidism, unspecified: Secondary | ICD-10-CM | POA: Diagnosis not present

## 2019-02-19 DIAGNOSIS — R634 Abnormal weight loss: Secondary | ICD-10-CM | POA: Diagnosis not present

## 2019-02-19 DIAGNOSIS — Z853 Personal history of malignant neoplasm of breast: Secondary | ICD-10-CM | POA: Diagnosis not present

## 2019-02-19 DIAGNOSIS — G231 Progressive supranuclear ophthalmoplegia [Steele-Richardson-Olszewski]: Secondary | ICD-10-CM | POA: Diagnosis not present

## 2019-02-19 DIAGNOSIS — I10 Essential (primary) hypertension: Secondary | ICD-10-CM | POA: Diagnosis not present

## 2019-02-20 DIAGNOSIS — G231 Progressive supranuclear ophthalmoplegia [Steele-Richardson-Olszewski]: Secondary | ICD-10-CM | POA: Diagnosis not present

## 2019-02-20 DIAGNOSIS — R634 Abnormal weight loss: Secondary | ICD-10-CM | POA: Diagnosis not present

## 2019-02-20 DIAGNOSIS — I10 Essential (primary) hypertension: Secondary | ICD-10-CM | POA: Diagnosis not present

## 2019-02-20 DIAGNOSIS — E039 Hypothyroidism, unspecified: Secondary | ICD-10-CM | POA: Diagnosis not present

## 2019-02-20 DIAGNOSIS — R1310 Dysphagia, unspecified: Secondary | ICD-10-CM | POA: Diagnosis not present

## 2019-02-20 DIAGNOSIS — Z853 Personal history of malignant neoplasm of breast: Secondary | ICD-10-CM | POA: Diagnosis not present

## 2019-02-21 DIAGNOSIS — Z853 Personal history of malignant neoplasm of breast: Secondary | ICD-10-CM | POA: Diagnosis not present

## 2019-02-21 DIAGNOSIS — G231 Progressive supranuclear ophthalmoplegia [Steele-Richardson-Olszewski]: Secondary | ICD-10-CM | POA: Diagnosis not present

## 2019-02-21 DIAGNOSIS — I10 Essential (primary) hypertension: Secondary | ICD-10-CM | POA: Diagnosis not present

## 2019-02-21 DIAGNOSIS — R634 Abnormal weight loss: Secondary | ICD-10-CM | POA: Diagnosis not present

## 2019-02-21 DIAGNOSIS — E039 Hypothyroidism, unspecified: Secondary | ICD-10-CM | POA: Diagnosis not present

## 2019-02-21 DIAGNOSIS — R1310 Dysphagia, unspecified: Secondary | ICD-10-CM | POA: Diagnosis not present

## 2019-02-22 ENCOUNTER — Telehealth: Payer: Self-pay

## 2019-02-22 NOTE — Telephone Encounter (Signed)
Copied from [place] [id number]. Topic: General - Deceased Patient >> March 21, 2019  5:57 PM Yvette Rack wrote: Reason for CRM: Annita Brod with [facility] called to report that patient passed away.Cb# [phone]

## 2019-02-22 NOTE — Telephone Encounter (Signed)
Copied from [place] [id number]. Topic: General - Deceased Patient >> 02-26-2019  5:57 PM Yvette Rack wrote: Reason for CRM: Annita Brod with [facility] called to report that patient passed away.Cb# [phone]

## 2019-02-22 NOTE — Telephone Encounter (Signed)
Spoke with her brother, Chrissie Noa.  Fortunately, she was under hospice care, she transition very peacefully.  Condolences provided.

## 2019-02-22 NOTE — Telephone Encounter (Signed)
Spoke w/ Hospice- she passed away 03/12/19.

## 2019-03-18 DEATH — deceased

## 2019-08-13 ENCOUNTER — Ambulatory Visit: Payer: Medicare Other | Admitting: Neurology
